# Patient Record
Sex: Male | Born: 1975
Health system: Southern US, Community
[De-identification: ages and names within clinical notes are randomized; demographics above are authoritative.]

## PROBLEM LIST (undated history)

## (undated) ENCOUNTER — Emergency Department (HOSPITAL_COMMUNITY): Payer: Medicare HMO

## (undated) DIAGNOSIS — L309 Dermatitis, unspecified: Secondary | ICD-10-CM

## (undated) DIAGNOSIS — J449 Chronic obstructive pulmonary disease, unspecified: Secondary | ICD-10-CM

## (undated) DIAGNOSIS — R519 Headache, unspecified: Secondary | ICD-10-CM

## (undated) DIAGNOSIS — R51 Headache: Secondary | ICD-10-CM

## (undated) DIAGNOSIS — I2510 Atherosclerotic heart disease of native coronary artery without angina pectoris: Secondary | ICD-10-CM

## (undated) DIAGNOSIS — G510 Bell's palsy: Secondary | ICD-10-CM

## (undated) DIAGNOSIS — Z86011 Personal history of benign neoplasm of the brain: Secondary | ICD-10-CM

## (undated) DIAGNOSIS — Z8669 Personal history of other diseases of the nervous system and sense organs: Secondary | ICD-10-CM

## (undated) DIAGNOSIS — K603 Anal fistula, unspecified: Secondary | ICD-10-CM

## (undated) DIAGNOSIS — B20 Human immunodeficiency virus [HIV] disease: Secondary | ICD-10-CM

## (undated) DIAGNOSIS — D329 Benign neoplasm of meninges, unspecified: Secondary | ICD-10-CM

## (undated) DIAGNOSIS — J4489 Other specified chronic obstructive pulmonary disease: Secondary | ICD-10-CM

## (undated) DIAGNOSIS — M069 Rheumatoid arthritis, unspecified: Secondary | ICD-10-CM

## (undated) DIAGNOSIS — G8929 Other chronic pain: Secondary | ICD-10-CM

## (undated) DIAGNOSIS — M797 Fibromyalgia: Secondary | ICD-10-CM

## (undated) HISTORY — DX: Other chronic pain: G89.29

## (undated) HISTORY — DX: Anal fistula: K60.3

## (undated) HISTORY — PX: CARPAL TUNNEL RELEASE: SHX101

## (undated) HISTORY — DX: Chronic obstructive pulmonary disease, unspecified: J44.9

## (undated) HISTORY — DX: Anal fistula, unspecified: K60.30

## (undated) HISTORY — DX: Headache: R51

## (undated) HISTORY — DX: Headache, unspecified: R51.9

## (undated) HISTORY — DX: Bell's palsy: G51.0

## (undated) HISTORY — DX: Atherosclerotic heart disease of native coronary artery without angina pectoris: I25.10

## (undated) HISTORY — DX: Benign neoplasm of meninges, unspecified: D32.9

## (undated) HISTORY — PX: CHOLECYSTECTOMY: SHX55

---
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
## 2002-11-13 HISTORY — PX: ANAL FISTULOTOMY: SHX6423

## 2002-12-27 DIAGNOSIS — B20 Human immunodeficiency virus [HIV] disease: Secondary | ICD-10-CM

## 2002-12-27 DIAGNOSIS — Z21 Asymptomatic human immunodeficiency virus [HIV] infection status: Secondary | ICD-10-CM

## 2002-12-27 HISTORY — DX: Human immunodeficiency virus (HIV) disease: B20

## 2002-12-27 HISTORY — DX: Asymptomatic human immunodeficiency virus (hiv) infection status: Z21

## 2007-11-14 HISTORY — PX: CHOLECYSTECTOMY, LAPAROSCOPIC: SHX56

## 2012-11-13 HISTORY — PX: CARPAL TUNNEL WITH CUBITAL TUNNEL: SHX5608

## 2013-11-13 HISTORY — PX: COLONOSCOPY: SHX174

## 2015-01-14 DIAGNOSIS — Z8619 Personal history of other infectious and parasitic diseases: Secondary | ICD-10-CM | POA: Insufficient documentation

## 2015-05-28 HISTORY — PX: OTHER SURGICAL HISTORY: SHX169

## 2017-08-11 ENCOUNTER — Ambulatory Visit (HOSPITAL_COMMUNITY)
Admission: EM | Admit: 2017-08-11 | Discharge: 2017-08-11 | Disposition: A | Discharge status: Home / Self Care | Payer: Medicare (Managed Care) | Attending: Family Medicine | Admitting: Family Medicine

## 2017-08-11 ENCOUNTER — Encounter (HOSPITAL_COMMUNITY): Payer: Self-pay

## 2017-08-11 ENCOUNTER — Telehealth (HOSPITAL_COMMUNITY): Payer: Self-pay | Admitting: *Deleted

## 2017-08-11 DIAGNOSIS — B20 Human immunodeficiency virus [HIV] disease: Secondary | ICD-10-CM

## 2017-08-11 HISTORY — DX: Human immunodeficiency virus (HIV) disease: B20

## 2017-08-11 MED ORDER — EFAVIRENZ-EMTRICITAB-TENOFOVIR 600-200-300 MG PO TABS: 1.0000 | ORAL_TABLET | Freq: Every day | ORAL | 0 refills | Status: DC

## 2017-08-11 NOTE — ED Triage Notes (Signed)
Patient presents to [facility] for medication refill

## 2017-08-11 NOTE — ED Provider Notes (Signed)
[place]    CSN: [id number] Arrival date & time: 08/11/17  1603     History   Chief Complaint Chief Complaint  Patient presents with  . Medication Refill    HPI Jonathan Rangel is a 41 y.o. male.   The history is provided by the patient. No language interpreter was used.  HIV Positive/AIDS  This is a chronic problem. Episode onset: 14 years ago. The problem occurs constantly. The problem has not changed since onset.Associated symptoms comments: Weight loss. Nothing aggravates the symptoms. Nothing relieves the symptoms.  He recently moved to [place] from [place]. Yet to establish care with infectious disease specialist. He was on  Atripla but was out of meds since May of 2018/4 months ago. He had been on this medication since 2007. Denies any side effects to medication. He will need refill of meds to keep him till he sees his doctor.  He came with lab results from [facility] ED done 07/30/17.  CD4 Count: 657.05 previous he was in the 1500 CD 4 count. He presented a letter from [place] which confirmed that he participated in HIV assistance program.   Past Medical History:  Diagnosis Date  . HIV (human immunodeficiency virus infection) ([place]) 12/27/2002    There are no active problems to display for this patient.   History reviewed. No pertinent surgical history.     Home Medications    Prior to Admission medications   Medication Sig Start Date End Date Taking? Authorizing Provider  albuterol (PROVENTIL HFA;VENTOLIN HFA) 108 (90 Base) MCG/ACT inhaler Inhale into the lungs every 6 (six) hours as needed for wheezing or shortness of breath.   Yes [provider]  amitriptyline (ELAVIL) 75 MG tablet Take 75 mg by mouth at bedtime.   Yes [provider]  HYDROcodone-acetaminophen (NORCO) 10-325 MG tablet Take 1 tablet by mouth every 6 (six) hours as needed.   Yes [provider]  methocarbamol (ROBAXIN) 750 MG tablet  Take 750 mg by mouth 4 (four) times daily.   Yes [provider]  efavirenz-emtricitabine-tenofovir (ATRIPLA) 600-200-300 MG tablet Take 1 tablet by mouth at bedtime.    [provider]    Family History History reviewed. No pertinent family history.  Social History Social History  Substance Use Topics  . Smoking status: Former Smoker    Types: Cigarettes  . Smokeless tobacco: Never Used  . Alcohol use No     Allergies   Patient has no known allergies.   Review of Systems Review of Systems  Constitutional: Positive for unexpected weight change. Negative for fever.  Respiratory: Negative.   Cardiovascular: Negative.   Gastrointestinal: Negative.   Genitourinary: Negative.   Neurological: Negative.   All other systems reviewed and are negative.    Physical Exam Triage Vital Signs ED Triage Vitals [08/11/17 1645]  Enc Vitals Group     BP 134/81     Pulse Rate 67     Resp 15     Temp 98.6 F (37 C)     Temp Source Oral     SpO2 100 %     Weight      Height      Head Circumference      Peak Flow      Pain Score      Pain Loc      Pain Edu?      Excl. in [place]?    No data found.   Updated Vital Signs BP  134/81 (BP Location: Left Arm)   Pulse 67   Temp 98.6 F (37 C) (Oral)   Resp 15   SpO2 100%   Visual Acuity Right Eye Distance:   Left Eye Distance:   Bilateral Distance:    Right Eye Near:   Left Eye Near:    Bilateral Near:     Physical Exam  Constitutional: He is oriented to person, place, and time. He appears well-developed. No distress.  Cardiovascular: Normal rate, regular rhythm and normal heart sounds.   No murmur heard. Pulmonary/Chest: Effort normal and breath sounds normal. No respiratory distress. He has no wheezes.  Abdominal: Soft. Bowel sounds are normal. He exhibits no distension and no mass. There is no tenderness.  Musculoskeletal: Normal range of motion. He exhibits no edema.  Neurological: He is alert and  oriented to person, place, and time. No cranial nerve deficit.  Skin: Skin is warm. No rash noted.  Nursing note and vitals reviewed.    UC Treatments / Results  Labs (all labs ordered are listed, but only abnormal results are displayed) Labs Reviewed - No data to display  EKG  EKG Interpretation None       Radiology No results found.  Procedures Procedures (including critical care time)  Medications Ordered in UC Medications - No data to display   Initial Impression / Assessment and Plan / UC Course  I have reviewed the triage vital signs and the nursing notes.  Pertinent labs & imaging results that were available during my care of the patient were reviewed by me and considered in my medical decision making (see chart for details).  Clinical Course as of Aug 11 1757  Sat Aug 11, 2017  1755 HIV medication refill. Labs and letter from [place] was reviewed by me confirming diagnosis. I recommended establishing care with ID as soon as possible. I refilled his Atripla for 1 month pending ID assessment. F/U as needed.  [KE]    Clinical Course User Index [KE] Kinnie Feil, MD      Final Clinical Impressions(s) / UC Diagnoses   Final diagnoses:  None   HIV disease ([place])   New Prescriptions New Prescriptions   No medications on file     Controlled Substance Prescriptions [place] Controlled Substance Registry consulted? Not Applicable   Kinnie Feil, MD 08/11/17 1759

## 2017-08-11 NOTE — Telephone Encounter (Signed)
Pt states pharmacy Rx was sent to is now closed.  Requesting Rx to go to [facility] [address].

## 2017-08-11 NOTE — Discharge Instructions (Signed)
Nice meeting you. I refilled your HIV medication pending the time you establish care with ID. Please call Dr. Henreitta Leber office as soon as possible to establish care. All the best.

## 2017-10-31 DIAGNOSIS — F333 Major depressive disorder, recurrent, severe with psychotic symptoms: Secondary | ICD-10-CM | POA: Diagnosis not present

## 2017-11-01 DIAGNOSIS — Z590 Homelessness: Secondary | ICD-10-CM | POA: Diagnosis not present

## 2017-11-01 DIAGNOSIS — Z21 Asymptomatic human immunodeficiency virus [HIV] infection status: Secondary | ICD-10-CM | POA: Diagnosis present

## 2017-11-01 DIAGNOSIS — M797 Fibromyalgia: Secondary | ICD-10-CM | POA: Diagnosis present

## 2017-11-01 DIAGNOSIS — F17210 Nicotine dependence, cigarettes, uncomplicated: Secondary | ICD-10-CM | POA: Diagnosis present

## 2017-11-01 DIAGNOSIS — Z62810 Personal history of physical and sexual abuse in childhood: Secondary | ICD-10-CM | POA: Diagnosis present

## 2017-11-01 DIAGNOSIS — R45851 Suicidal ideations: Secondary | ICD-10-CM | POA: Diagnosis present

## 2017-11-01 DIAGNOSIS — F3130 Bipolar disorder, current episode depressed, mild or moderate severity, unspecified: Secondary | ICD-10-CM | POA: Diagnosis not present

## 2017-11-01 DIAGNOSIS — Z915 Personal history of self-harm: Secondary | ICD-10-CM | POA: Diagnosis not present

## 2017-11-01 DIAGNOSIS — M069 Rheumatoid arthritis, unspecified: Secondary | ICD-10-CM | POA: Diagnosis present

## 2017-11-01 DIAGNOSIS — G629 Polyneuropathy, unspecified: Secondary | ICD-10-CM | POA: Diagnosis present

## 2017-11-20 ENCOUNTER — Encounter: Payer: Self-pay | Admitting: Infectious Diseases

## 2017-11-20 ENCOUNTER — Ambulatory Visit: Payer: Self-pay

## 2017-11-20 ENCOUNTER — Ambulatory Visit (INDEPENDENT_AMBULATORY_CARE_PROVIDER_SITE_OTHER): Payer: Medicare Other | Admitting: Infectious Diseases

## 2017-11-20 VITALS — BP 103/71 | HR 106 | Temp 98.4°F | Wt 143.0 lb

## 2017-11-20 DIAGNOSIS — R945 Abnormal results of liver function studies: Secondary | ICD-10-CM

## 2017-11-20 DIAGNOSIS — Z113 Encounter for screening for infections with a predominantly sexual mode of transmission: Secondary | ICD-10-CM

## 2017-11-20 DIAGNOSIS — Z8619 Personal history of other infectious and parasitic diseases: Secondary | ICD-10-CM | POA: Diagnosis not present

## 2017-11-20 DIAGNOSIS — M069 Rheumatoid arthritis, unspecified: Secondary | ICD-10-CM | POA: Insufficient documentation

## 2017-11-20 DIAGNOSIS — G894 Chronic pain syndrome: Secondary | ICD-10-CM | POA: Diagnosis not present

## 2017-11-20 DIAGNOSIS — K7689 Other specified diseases of liver: Secondary | ICD-10-CM

## 2017-11-20 DIAGNOSIS — M797 Fibromyalgia: Secondary | ICD-10-CM

## 2017-11-20 DIAGNOSIS — B20 Human immunodeficiency virus [HIV] disease: Secondary | ICD-10-CM | POA: Diagnosis not present

## 2017-11-20 DIAGNOSIS — Z21 Asymptomatic human immunodeficiency virus [HIV] infection status: Secondary | ICD-10-CM

## 2017-11-20 MED ORDER — BICTEGRAVIR-EMTRICITAB-TENOFOV 50-200-25 MG PO TABS: 1.0000 | ORAL_TABLET | Freq: Every day | ORAL | 5 refills | Status: DC

## 2017-11-20 NOTE — Progress Notes (Signed)
Jonathan Rangel  03/01/1976 [id number] Patient, No Pcp Per   Reason for Visit: Routine HIV Care - Initial Visit to Establish   Patient Active Problem List   Diagnosis Date Noted  . Abnormal liver function 11/23/2017  . Chronic pain   . HIV (human immunodeficiency virus infection) ([place]) 11/20/2017  . Rheumatoid arthritis ([place]) 11/20/2017  . Fibromyalgia 11/20/2017  . History of syphilis 01/14/2015   Outpatient Medications Prior to Visit  Medication Sig Dispense Refill  . albuterol (PROVENTIL HFA;VENTOLIN HFA) 108 (90 Base) MCG/ACT inhaler Inhale into the lungs every 6 (six) hours as needed for wheezing or shortness of breath.    Marland Kitchen amitriptyline (ELAVIL) 75 MG tablet Take 75 mg by mouth at bedtime.    Marland Kitchen efavirenz-emtricitabine-tenofovir (ATRIPLA) 600-200-300 MG tablet Take 1 tablet by mouth at bedtime. 30 tablet 0  . HYDROcodone-acetaminophen (NORCO) 10-325 MG tablet Take 1 tablet by mouth every 6 (six) hours as needed.    . methocarbamol (ROBAXIN) 750 MG tablet Take 750 mg by mouth 4 (four) times daily.     No facility-administered medications prior to visit.     HPI:  Jonathan Rangel is here to establish care for his HIV infection. He is working with [facility] and was referred to [facility]. He has previously been on Atripla until he moved here from [place]. He has been positive for a long time and well controlled since 2012. He is upset today because he is losing his housing and is angry at the limited resources available to him here in [place] compared to [place]. He does not have a plan for housing at this time but "will work something out." He does have family around however he does not want to discuss further. Reports no complaints today suggestive of associated opportunistic infection or advancing HIV disease such as fevers, night sweats, weight loss, anorexia, cough, SOB, nausea, vomiting, diarrhea, headache, sensory changes, lymphadenopathy or oral thrush.  He also has chronic pain/fibromyalgia and  rheumatoid arthritis and currently not on medications. He is looking into pain clinic for management.   Review of Systems: Review of Systems  Constitutional: Negative for chills, fever, malaise/fatigue and weight loss.  HENT: Negative for sore throat.        No dental problems  Respiratory: Negative for cough and sputum production.   Cardiovascular: Negative for chest pain and leg swelling.  Gastrointestinal: Negative for abdominal pain, diarrhea and vomiting.  Genitourinary: Negative for dysuria and flank pain.  Musculoskeletal: Positive for back pain, joint pain and myalgias. Negative for neck pain.  Skin: Negative for rash.  Neurological: Negative for dizziness, tingling and headaches.  Psychiatric/Behavioral: Negative for depression and substance abuse. The patient is not nervous/anxious and does not have insomnia.     Past Medical History:  Diagnosis Date  . Anal fistula   . Bell's palsy   . 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 CAD (coronary artery disease)   . Chronic pain   . COPD (chronic obstructive pulmonary disease) ([place])   . Headache   . HIV (human immunodeficiency virus infection) ([place]) 12/27/2002    No Known Allergies  Social History   Tobacco Use  . Smoking status: Current Every Day Smoker    Types: Cigarettes  . Smokeless tobacco: Never Used  Substance Use Topics  . Alcohol use: No  . Drug use: No    Family History  Problem Relation Age of Onset  . Breast cancer Maternal Grandmother  Social History   Substance and Sexual Activity  Sexual Activity No    Physical Exam and Objective Findings:  Vitals:   11/20/17 1452  BP: 103/71  Pulse: (!) 106  Temp: 98.4 F (36.9 C)  TempSrc: Oral  Weight: 143 lb (64.9 kg)   There is no height or weight on file to calculate BMI.  Physical Exam  Constitutional: He is oriented to person, place, and time and well-developed, well-nourished, and in no distress.  HENT:    Mouth/Throat: No oral lesions. Normal dentition. No dental caries.  Eyes: No scleral icterus.  Cardiovascular: Normal rate, regular rhythm and normal heart sounds.  Pulmonary/Chest: Effort normal and breath sounds normal.  Abdominal: Soft. He exhibits no distension. There is no tenderness.  Lymphadenopathy:    He has no cervical adenopathy.  Neurological: He is alert and oriented to person, place, and time.  Skin: Skin is warm and dry. No rash noted.  Psychiatric: Mood and affect normal.  Vitals reviewed.   Lab Results Lab Results  Component Value Date   WBC 8.6 11/20/2017   HGB 16.5 11/20/2017   HCT 47.8 11/20/2017   MCV 88.2 11/20/2017   PLT 381 11/20/2017    Lab Results  Component Value Date   CREATININE 1.09 11/20/2017   BUN 20 11/20/2017   NA 143 11/20/2017   K 4.1 11/20/2017   CL 103 11/20/2017   CO2 23 11/20/2017    Lab Results  Component Value Date   ALT 41 11/20/2017   AST 33 11/20/2017   BILITOT 0.4 11/20/2017    Lab Results  Component Value Date   CHOL 230 (H) 11/20/2017   HDL 59 11/20/2017   TRIG 154 (H) 11/20/2017   CHOLHDL 3.9 11/20/2017   CD4 T Cell Abs (/uL)  Date Value  11/20/2017 1,120   No results found for: HAV Lab Results  Component Value Date   HEPBSAG NON-REACTIVE 11/20/2017   HEPBSAB BORDERLINE (A) 11/20/2017   No results found for: HCVAB No results found for: CHLAMYDIAWP, N No results found for: GCPROBEAPT No results found for: QUANTGOLD No results found for: RPR    Problem List Items Addressed This Visit      Musculoskeletal and Integument   Rheumatoid arthritis ([place])     Other   Abnormal liver function    Recheck LFTs today as well as hepatitis serologies.       Chronic pain    He will establish with a pain clinic here locally.       Fibromyalgia   History of syphilis    Check RPR titer today. I will request more information from previous provider about older titers to better interpret. No active symptoms. Safe  sex counseling provided.       HIV (human immunodeficiency virus infection) ([place]) (Chronic)    We reviewed newer medication options today with [place]. We decided to change to [facility] today for benefit of TAF over TDF. His clinical records indicate he has been undetectable since 2012. Check baseline labs today. Will have him return in 4 - 6 weeks with blood work at that visit to ensure [facility] is working for him. (currently working on housing and transportation).       Relevant Medications   bictegravir-emtricitabine-tenofovir AF (BIKTARVY) 50-200-25 MG TABS tablet   Other Relevant Orders   HIV RNA, RTPCR W/R GT (RTI, PI,INT)   T-helper cell (CD4)- ([facility] clinic only) (Completed)   COMPLETE METABOLIC PANEL WITH GFR (Completed)   CBC with Differential/Platelet (  Completed)   Lipid panel (Completed)   Hepatitis C genotype   HLA B*5701   Hemoglobin A1c (Completed)   Hepatitis A antibody, total (Completed)   Hepatitis B surface antibody (Completed)   Hepatitis B surface antigen (Completed)   Hepatitis C antibody (Completed)   Hepatitis B Core Antibody, total (Completed)   QuantiFERON-TB Gold Plus (Completed)    Other Visit Diagnoses    Routine screening for STI (sexually transmitted infection)    -  Primary   Relevant Orders   RPR (Completed)     Janene Madeira, MSN, NP-C [facility] Pager: [phone]  11/23/2017  3:58 PM

## 2017-11-20 NOTE — Patient Instructions (Signed)
Very nice to meet you today.   Biktarvy is the pill for your HIV infection - this will need to be taken once a day around the same time.  - Common side effects for a short time frame usually include headaches, nausea and diarrhea - OK to take over the counter tylenol for headaches and imodium for diarrhea - Try taking with food if you are nauseated    We will check your blood work today and again in 4-6 weeks when you return to make sure the medication is working well for you.

## 2017-11-21 ENCOUNTER — Encounter: Payer: Self-pay | Admitting: Infectious Diseases

## 2017-11-21 LAB — LIPID PANEL
Cholesterol: 230 mg/dL — ABNORMAL HIGH (ref <200)
HDL: 59 mg/dL (ref >40)
LDL Cholesterol (Calc): 142 mg/dL (calc) — ABNORMAL HIGH
Non-HDL Cholesterol (Calc): 171 mg/dL (calc) — ABNORMAL HIGH (ref <130)
Total CHOL/HDL Ratio: 3.9 (calc) (ref <5.0)
Triglycerides: 154 mg/dL — ABNORMAL HIGH (ref <150)

## 2017-11-21 NOTE — Progress Notes (Signed)
Will discuss adding statin and baby aspirin daily for CV risk reduction as he is actively smoking at his upcoming visit in 1 month.

## 2017-11-22 LAB — QUANTIFERON-TB GOLD PLUS
Mitogen-NIL: >10 IU/mL
NIL: 0.07 IU/mL
QuantiFERON-TB Gold Plus: NEGATIVE
TB1-NIL: 0.06 IU/mL
TB2-NIL: 0.03 IU/mL

## 2017-11-22 LAB — T-HELPER CELL (CD4) - (RCID CLINIC ONLY)
CD4 % Helper T Cell: 26 % — ABNORMAL LOW (ref 33–55)
CD4 T Cell Abs: 1120 /uL (ref 400–2700)

## 2017-11-23 ENCOUNTER — Encounter: Payer: Self-pay | Admitting: Infectious Diseases

## 2017-11-23 DIAGNOSIS — R945 Abnormal results of liver function studies: Secondary | ICD-10-CM | POA: Insufficient documentation

## 2017-11-23 DIAGNOSIS — G8929 Other chronic pain: Secondary | ICD-10-CM | POA: Insufficient documentation

## 2017-11-23 LAB — HEPATITIS C GENOTYPE: HCV Genotype: NOT DETECTED

## 2017-11-23 NOTE — Assessment & Plan Note (Signed)
He will establish with a pain clinic here locally.

## 2017-11-23 NOTE — Assessment & Plan Note (Signed)
We reviewed newer medication options today with Jonathan Rangel. We decided to change to Jonathan Rangel today for benefit of TAF over TDF. His clinical records indicate he has been undetectable since 2012. Check baseline labs today. Will have him return in 4 - 6 weeks with blood work at that visit to ensure Jonathan Rangel is working for him. (currently working on housing and transportation).

## 2017-11-23 NOTE — Assessment & Plan Note (Signed)
Check RPR titer today. I will request more information from previous provider about older titers to better interpret. No active symptoms. Safe sex counseling provided.

## 2017-11-23 NOTE — Assessment & Plan Note (Signed)
Recheck LFTs today as well as hepatitis serologies.

## 2017-11-24 LAB — CBC WITH DIFFERENTIAL/PLATELET
Basophils Absolute: 77 cells/uL (ref 0–200)
Basophils Relative: 0.9 %
Eosinophils Absolute: 163 cells/uL (ref 15–500)
Eosinophils Relative: 1.9 %
HCT: 47.8 % (ref 38.5–50.0)
Hemoglobin: 16.5 g/dL (ref 13.2–17.1)
Lymphs Abs: 3844 cells/uL (ref 850–3900)
MCH: 30.4 pg (ref 27.0–33.0)
MCHC: 34.5 g/dL (ref 32.0–36.0)
MCV: 88.2 fL (ref 80.0–100.0)
MPV: 9.6 fL (ref 7.5–12.5)
Monocytes Relative: 11 %
Neutro Abs: 3569 cells/uL (ref 1500–7800)
Neutrophils Relative %: 41.5 %
Platelets: 381 10*3/uL (ref 140–400)
RBC: 5.42 10*6/uL (ref 4.20–5.80)
RDW: 14.4 % (ref 11.0–15.0)
Total Lymphocyte: 44.7 %
WBC mixed population: 946 cells/uL (ref 200–950)
WBC: 8.6 10*3/uL (ref 3.8–10.8)

## 2017-11-24 LAB — HLA B*5701: HLA-B*5701 w/rflx HLA-B High: NEGATIVE

## 2017-11-24 LAB — COMPLETE METABOLIC PANEL WITH GFR
AG Ratio: 1.7 (calc) (ref 1.0–2.5)
ALT: 41 U/L (ref 9–46)
AST: 33 U/L (ref 10–40)
Albumin: 5 g/dL (ref 3.6–5.1)
Alkaline phosphatase (APISO): 121 U/L — ABNORMAL HIGH (ref 40–115)
BUN: 20 mg/dL (ref 7–25)
CO2: 23 mmol/L (ref 20–32)
Calcium: 10.3 mg/dL (ref 8.6–10.3)
Chloride: 103 mmol/L (ref 98–110)
Creat: 1.09 mg/dL (ref 0.60–1.35)
GFR, Est African American: 97 mL/min/{1.73_m2} (ref >=60)
GFR, Est Non African American: 83 mL/min/{1.73_m2} (ref >=60)
Globulin: 3 g/dL (calc) (ref 1.9–3.7)
Glucose, Bld: 95 mg/dL (ref 65–99)
Potassium: 4.1 mmol/L (ref 3.5–5.3)
Sodium: 143 mmol/L (ref 135–146)
Total Bilirubin: 0.4 mg/dL (ref 0.2–1.2)
Total Protein: 8 g/dL (ref 6.1–8.1)

## 2017-11-24 LAB — HEPATITIS B SURFACE ANTIBODY,QUALITATIVE: Hep B S Ab: BORDERLINE — AB

## 2017-11-24 LAB — HEMOGLOBIN A1C
Hgb A1c MFr Bld: 5.6 % of total Hgb (ref <5.7)
Mean Plasma Glucose: 114 (calc)
eAG (mmol/L): 6.3 (calc)

## 2017-11-24 LAB — FLUORESCENT TREPONEMAL AB(FTA)-IGG-BLD: Fluorescent Treponemal ABS: REACTIVE — AB

## 2017-11-24 LAB — HEPATITIS C ANTIBODY
Hepatitis C Ab: NONREACTIVE
SIGNAL TO CUT-OFF: 0.04 (ref <1.00)

## 2017-11-24 LAB — HEPATITIS B SURFACE ANTIGEN: Hepatitis B Surface Ag: NONREACTIVE

## 2017-11-24 LAB — HEPATITIS B CORE ANTIBODY, TOTAL: Hep B Core Total Ab: NONREACTIVE

## 2017-11-24 LAB — RPR TITER: RPR Titer: 1:4 {titer} — ABNORMAL HIGH

## 2017-11-24 LAB — HEPATITIS A ANTIBODY, TOTAL: Hepatitis A AB,Total: REACTIVE — AB

## 2017-11-24 LAB — RPR: RPR Ser Ql: REACTIVE — AB

## 2017-11-24 NOTE — Progress Notes (Signed)
RPR titer 1:4 now - Last RPR Titer per previous records shows titer 1:16. No evidence of reinfection and expected 4-fold drop post treatment. Continue to monitor as he has required treatment at least 2 times in the past.

## 2017-11-26 LAB — HIV RNA, RTPCR W/R GT (RTI, PI,INT)
HIV 1 RNA Quant: 30 copies/mL — ABNORMAL HIGH
HIV-1 RNA Quant, Log: 1.48 Log copies/mL — ABNORMAL HIGH

## 2017-12-11 ENCOUNTER — Encounter: Payer: Self-pay | Admitting: *Deleted

## 2017-12-24 DIAGNOSIS — Z21 Asymptomatic human immunodeficiency virus [HIV] infection status: Secondary | ICD-10-CM | POA: Diagnosis not present

## 2017-12-24 DIAGNOSIS — F17210 Nicotine dependence, cigarettes, uncomplicated: Secondary | ICD-10-CM | POA: Diagnosis not present

## 2017-12-24 DIAGNOSIS — K0889 Other specified disorders of teeth and supporting structures: Secondary | ICD-10-CM | POA: Diagnosis not present

## 2017-12-24 DIAGNOSIS — Z79899 Other long term (current) drug therapy: Secondary | ICD-10-CM | POA: Diagnosis not present

## 2018-01-24 ENCOUNTER — Encounter (HOSPITAL_COMMUNITY): Payer: Self-pay | Admitting: Emergency Medicine

## 2018-01-24 ENCOUNTER — Other Ambulatory Visit: Payer: Self-pay

## 2018-01-24 ENCOUNTER — Emergency Department (HOSPITAL_COMMUNITY)
Admission: EM | Admit: 2018-01-24 | Discharge: 2018-01-24 | Disposition: A | Discharge status: Home / Self Care | Payer: Medicare Other | Attending: Emergency Medicine | Admitting: Emergency Medicine

## 2018-01-24 ENCOUNTER — Emergency Department (HOSPITAL_COMMUNITY): Payer: Medicare Other

## 2018-01-24 DIAGNOSIS — F22 Delusional disorders: Secondary | ICD-10-CM

## 2018-01-24 DIAGNOSIS — J449 Chronic obstructive pulmonary disease, unspecified: Secondary | ICD-10-CM | POA: Diagnosis not present

## 2018-01-24 DIAGNOSIS — R440 Auditory hallucinations: Secondary | ICD-10-CM

## 2018-01-24 DIAGNOSIS — Z79899 Other long term (current) drug therapy: Secondary | ICD-10-CM | POA: Insufficient documentation

## 2018-01-24 DIAGNOSIS — F1994 Other psychoactive substance use, unspecified with psychoactive substance-induced mood disorder: Secondary | ICD-10-CM | POA: Diagnosis not present

## 2018-01-24 DIAGNOSIS — B20 Human immunodeficiency virus [HIV] disease: Secondary | ICD-10-CM | POA: Insufficient documentation

## 2018-01-24 DIAGNOSIS — F17210 Nicotine dependence, cigarettes, uncomplicated: Secondary | ICD-10-CM | POA: Insufficient documentation

## 2018-01-24 DIAGNOSIS — I2510 Atherosclerotic heart disease of native coronary artery without angina pectoris: Secondary | ICD-10-CM | POA: Insufficient documentation

## 2018-01-24 DIAGNOSIS — M069 Rheumatoid arthritis, unspecified: Secondary | ICD-10-CM | POA: Diagnosis not present

## 2018-01-24 LAB — RAPID URINE DRUG SCREEN, HOSP PERFORMED
Amphetamines: POSITIVE — AB
Barbiturates: NOT DETECTED
Benzodiazepines: NOT DETECTED
Cocaine: NOT DETECTED
Opiates: NOT DETECTED
Tetrahydrocannabinol: NOT DETECTED

## 2018-01-24 LAB — COMPREHENSIVE METABOLIC PANEL
ALT: 18 U/L (ref 17–63)
AST: 18 U/L (ref 15–41)
Albumin: 3.6 g/dL (ref 3.5–5.0)
Alkaline Phosphatase: 74 U/L (ref 38–126)
Anion gap: 9 (ref 5–15)
BUN: 12 mg/dL (ref 6–20)
CO2: 27 mmol/L (ref 22–32)
Calcium: 8.7 mg/dL — ABNORMAL LOW (ref 8.9–10.3)
Chloride: 102 mmol/L (ref 101–111)
Creatinine, Ser: 1.04 mg/dL (ref 0.61–1.24)
GFR calc Af Amer: >60 mL/min (ref >60)
GFR calc non Af Amer: >60 mL/min (ref >60)
Glucose, Bld: 101 mg/dL — ABNORMAL HIGH (ref 65–99)
Potassium: 3.2 mmol/L — ABNORMAL LOW (ref 3.5–5.1)
Sodium: 138 mmol/L (ref 135–145)
Total Bilirubin: 0.4 mg/dL (ref 0.3–1.2)
Total Protein: 6.4 g/dL — ABNORMAL LOW (ref 6.5–8.1)

## 2018-01-24 LAB — CBC WITH DIFFERENTIAL/PLATELET
Basophils Absolute: 0 10*3/uL (ref 0.0–0.1)
Basophils Relative: 0 %
Eosinophils Absolute: 0.1 10*3/uL (ref 0.0–0.7)
Eosinophils Relative: 2 %
HCT: 39.7 % (ref 39.0–52.0)
Hemoglobin: 12.8 g/dL — ABNORMAL LOW (ref 13.0–17.0)
Lymphocytes Relative: 30 %
Lymphs Abs: 1.9 10*3/uL (ref 0.7–4.0)
MCH: 30.8 pg (ref 26.0–34.0)
MCHC: 32.2 g/dL (ref 30.0–36.0)
MCV: 95.7 fL (ref 78.0–100.0)
Monocytes Absolute: 0.6 10*3/uL (ref 0.1–1.0)
Monocytes Relative: 9 %
Neutro Abs: 3.9 10*3/uL (ref 1.7–7.7)
Neutrophils Relative %: 59 %
Platelets: 258 10*3/uL (ref 150–400)
RBC: 4.15 MIL/uL — ABNORMAL LOW (ref 4.22–5.81)
RDW: 14 % (ref 11.5–15.5)
WBC: 6.5 10*3/uL (ref 4.0–10.5)

## 2018-01-24 LAB — ETHANOL: Alcohol, Ethyl (B): <10 mg/dL (ref <10)

## 2018-01-24 MED ORDER — ALBUTEROL SULFATE HFA 108 (90 BASE) MCG/ACT IN AERS
2.0000 | INHALATION_SPRAY | Freq: Four times a day (QID) | RESPIRATORY_TRACT | Status: DC | PRN
Start: 2018-01-24 — End: 2018-01-24

## 2018-01-24 MED ORDER — BICTEGRAVIR-EMTRICITAB-TENOFOV 50-200-25 MG PO TABS
1.0000 | ORAL_TABLET | Freq: Every day | ORAL | Status: DC
  Administered 2018-01-24: 1 via ORAL
  Filled 2018-01-24: qty 1

## 2018-01-24 MED ORDER — GABAPENTIN 400 MG PO CAPS
400.0000 mg | ORAL_CAPSULE | Freq: Four times a day (QID) | ORAL | Status: DC
  Administered 2018-01-24: 400 mg via ORAL
  Filled 2018-01-24: qty 1

## 2018-01-24 MED ORDER — IBUPROFEN 200 MG PO TABS: 600.0000 mg | ORAL_TABLET | Freq: Four times a day (QID) | ORAL | Status: DC | PRN

## 2018-01-24 MED ORDER — ACETAMINOPHEN 325 MG PO TABS: 650.0000 mg | ORAL_TABLET | ORAL | Status: DC | PRN

## 2018-01-24 MED ORDER — FLUOXETINE HCL 20 MG PO CAPS
40.0000 mg | ORAL_CAPSULE | Freq: Every day | ORAL | Status: DC
  Administered 2018-01-24: 40 mg via ORAL
  Filled 2018-01-24: qty 2

## 2018-01-24 MED ORDER — ALUM & MAG HYDROXIDE-SIMETH 200-200-20 MG/5ML PO SUSP: 30.0000 mL | Freq: Four times a day (QID) | ORAL | Status: DC | PRN

## 2018-01-24 MED ORDER — POTASSIUM CHLORIDE CRYS ER 20 MEQ PO TBCR
40.0000 meq | EXTENDED_RELEASE_TABLET | Freq: Once | ORAL | Status: AC
  Administered 2018-01-24: 40 meq via ORAL
  Filled 2018-01-24: qty 2

## 2018-01-24 NOTE — ED Notes (Signed)
Pt continues to refuse ordered CT.

## 2018-01-24 NOTE — ED Notes (Signed)
Pt d/c home per MD order. Discharge summary reviewed with pt. Pt verbalizes understanding.  Pt denies SI/HI/AVH. Pt signed e-signature. Personal property returned to pt.Bus pass provided per pt request. Pt ambulatory off unit with [facility],.

## 2018-01-24 NOTE — BH Assessment (Signed)
[facility] Assessment Progress Note  Per Buford Dresser, DO, this pt does not require psychiatric hospitalization at this time.  Pt is to be discharged from [facility] with recommendation to follow up with [facility].  This has been included in pt's discharge instructions.  This Probation officer also called Levada Dy, nurse case Freight forwarder, who reports that pt has infectious disease support in the community.  Pt's nurse, Caryl Pina, has been notified.  Jalene Mullet, [place] Triage Specialist [phone]

## 2018-01-24 NOTE — BH Assessment (Signed)
[facility] Assessment Progress Note   Patient has not slept in a long time and refuses to participate in assessment at this time.

## 2018-01-24 NOTE — Care Management Note (Signed)
Case Management Note  CM consulted for ID referral.  CM noted pt was just seen at [facility] in Jan.  Placed information on AVS for time of D/C.  Updated Tom.  No further CM needs noted at this time.

## 2018-01-24 NOTE — ED Provider Notes (Signed)
[facility] DEPT Provider Note   CSN: [id number] Arrival date & time: 01/24/18  0247     History   Chief Complaint Chief Complaint  Patient presents with  . Medical Clearance    HPI Jonathan Rangel is a 42 y.o. male.  HPI  42 yo m with PMHx as below here with hallucinations. Pt reports that he's here because he had a hard time sleeping at his house. He states that he lives with a roommate and that over the past several months, he's had worsening hallucinations. He reports that he hears the voices of people he knows in his head, constantly. They've been getting worse. Last night, he felt like his old roommate and current roommate were plotting against him. He began to hear voices telling him that he should hurt himself, and that they were going to kill him. He felt like this was actually his roommate talking, rather than voices in his head. He also thinks that someone has been watching him via cameras in his house and has been outside his window. He denies h/o psych issues. No other medical complaints. No headache. No focal numbness or weakness.   Past Medical History:  Diagnosis Date  . Anal fistula   . Bell's palsy   . Benign neoplasm of meninges ([place])    gamma knife 05/28/15  . CAD (coronary artery disease)   . Chronic pain   . COPD (chronic obstructive pulmonary disease) ([place])   . Headache   . HIV (human immunodeficiency virus infection) ([place]) 12/27/2002    Patient Active Problem List   Diagnosis Date Noted  . Abnormal liver function 11/23/2017  . Chronic pain   . HIV (human immunodeficiency virus infection) ([place]) 11/20/2017  . Rheumatoid arthritis ([address]) 11/20/2017  . Fibromyalgia 11/20/2017  . History of syphilis 01/14/2015    Past Surgical History:  Procedure Laterality Date  . CARPAL TUNNEL RELEASE    . CHOLECYSTECTOMY    . COLONOSCOPY  2015   patient reported normal   . Stereotactic destruction of lesion using gamma radiation   05/28/2015   right temporal lobe   . TONSILLECTOMY         Home Medications    Prior to Admission medications   Medication Sig Start Date End Date Taking? Authorizing Provider  albuterol (PROVENTIL HFA;VENTOLIN HFA) 108 (90 Base) MCG/ACT inhaler Inhale into the lungs every 6 (six) hours as needed for wheezing or shortness of breath.   Yes [provider]  clindamycin (CLEOCIN) 300 MG capsule Take 1 capsule by mouth every 6 (six) hours. 12/27/17  Yes [provider]  FLUoxetine (PROZAC) 40 MG capsule Take 40 mg by mouth daily.   Yes [provider]  gabapentin (NEURONTIN) 400 MG capsule Take 400 mg by mouth 4 (four) times daily.   Yes [provider]  ibuprofen (ADVIL,MOTRIN) 200 MG tablet Take 600 mg by mouth every 6 (six) hours as needed for moderate pain.   Yes [provider]  bictegravir-emtricitabine-tenofovir AF (BIKTARVY) 50-200-25 MG TABS tablet Take 1 tablet by mouth daily. Try to take at the same time each day with or without food. Patient not taking: Reported on 01/24/2018 11/20/17   [place] Callas, NP  efavirenz-emtricitabine-tenofovir (ATRIPLA) 600-200-300 MG tablet Take 1 tablet by mouth at bedtime. Patient not taking: Reported on 01/24/2018 08/11/17   Kinnie Feil, MD    Family History Family History  Problem Relation Age of Onset  . Breast cancer Maternal Grandmother   .  Cancer Maternal Aunt     Social History Social History   Tobacco Use  . Smoking status: Current Every Day Smoker    Types: Cigarettes  . Smokeless tobacco: Never Used  Substance Use Topics  . Alcohol use: No  . Drug use: No     Allergies   Patient has no known allergies.   Review of Systems Review of Systems  Constitutional: Negative for chills, fatigue and fever.  HENT: Negative for congestion and rhinorrhea.   Eyes: Negative for visual disturbance.  Respiratory: Negative for cough, shortness of breath and wheezing.   Cardiovascular:  Negative for chest pain and leg swelling.  Gastrointestinal: Negative for abdominal pain, diarrhea, nausea and vomiting.  Genitourinary: Negative for dysuria and flank pain.  Musculoskeletal: Negative for neck pain and neck stiffness.  Skin: Negative for rash and wound.  Allergic/Immunologic: Negative for immunocompromised state.  Neurological: Negative for syncope, weakness and headaches.  Psychiatric/Behavioral: Positive for hallucinations.  All other systems reviewed and are negative.    Physical Exam Updated Vital Signs BP 118/68 (BP Location: Left Arm)   Pulse 90   Temp 98.1 F (36.7 C) (Oral)   Resp 18   SpO2 99%   Physical Exam  Constitutional: He is oriented to person, place, and time. He appears well-developed and well-nourished. No distress.  HENT:  Head: Normocephalic and atraumatic.  Eyes: Conjunctivae are normal.  Neck: Neck supple.  Cardiovascular: Normal rate, regular rhythm and normal heart sounds. Exam reveals no friction rub.  No murmur heard. Pulmonary/Chest: Effort normal and breath sounds normal. No respiratory distress. He has no wheezes. He has no rales.  Abdominal: He exhibits no distension.  Musculoskeletal: He exhibits no edema.  Neurological: He is alert and oriented to person, place, and time. He has normal strength. No cranial nerve deficit or sensory deficit. He exhibits normal muscle tone. GCS eye subscore is 4. GCS verbal subscore is 5. GCS motor subscore is 6.  Skin: Skin is warm. Capillary refill takes less than 2 seconds.  Psychiatric: He has a normal mood and affect. Thought content is paranoid.  +AH  Nursing note and vitals reviewed.    ED Treatments / Results  Labs (all labs ordered are listed, but only abnormal results are displayed) Labs Reviewed  COMPREHENSIVE METABOLIC PANEL - Abnormal; Notable for the following components:      Result Value   Potassium 3.2 (*)    Glucose, Bld 101 (*)    Calcium 8.7 (*)    Total Protein 6.4  (*)    All other components within normal limits  CBC WITH DIFFERENTIAL/PLATELET - Abnormal; Notable for the following components:   RBC 4.15 (*)    Hemoglobin 12.8 (*)    All other components within normal limits  ETHANOL  RAPID URINE DRUG SCREEN, HOSP PERFORMED    EKG  EKG Interpretation  Date/Time:  Thursday January 24 2018 03:57:04 EDT Ventricular Rate:  82 PR Interval:  128 QRS Duration: 90 QT Interval:  376 QTC Calculation: 439 R Axis:   70 Text Interpretation:  Normal sinus rhythm Moderate voltage criteria for LVH, may be normal variant Nonspecific T wave abnormality Abnormal ECG No old tracing to compare Confirmed by Duffy Bruce [phone]) on 01/24/2018 5:04:10 AM       Radiology No results found.  Procedures Procedures (including critical care time)  Medications Ordered in ED Medications  acetaminophen (TYLENOL) tablet 650 mg (not administered)  alum & mag hydroxide-simeth (MAALOX/MYLANTA) 200-200-20 MG/5ML suspension 30 mL (not  administered)  albuterol (PROVENTIL HFA;VENTOLIN HFA) 108 (90 Base) MCG/ACT inhaler 2 puff (not administered)  FLUoxetine (PROZAC) capsule 40 mg (not administered)  gabapentin (NEURONTIN) capsule 400 mg (not administered)  ibuprofen (ADVIL,MOTRIN) tablet 600 mg (not administered)  bictegravir-emtricitabine-tenofovir AF (BIKTARVY) 50-200-25 MG per tablet 1 tablet (not administered)  potassium chloride SA (K-DUR,KLOR-CON) CR tablet 40 mEq (40 mEq Oral Given 01/24/18 0510)     Initial Impression / Assessment and Plan / ED Course  I have reviewed the triage vital signs and the nursing notes.  Pertinent labs & imaging results that were available during my care of the patient were reviewed by me and considered in my medical decision making (see chart for details).     42 yo M with PMHx as above including HIV, h/o meningioma here with auditory hallucinations, paranoia. Concern for acute psychotic disorder. He has no focal neuro deficits, but  given his h/o HIV and meningioma, will obtain CT though history, exam is more c/w primary psychotic disorder. He is otherwise medically stable. Mild hypokalemia has been replaced. His HIV meds have been re-ordered. He has no HA, neck stiffness, or signs of meningitis, encephalitis, or infectious process. Awaiting CT, TTS consult.  Patient care transferred to Dr. Alvino Chapel at the end of my shift. Patient presentation, ED course, and plan of care discussed with review of all pertinent labs and imaging. Please see his/her note for further details regarding further ED course and disposition.   Final Clinical Impressions(s) / ED Diagnoses   Final diagnoses:  Paranoia [facility])  Auditory hallucination    ED Discharge Orders    None       Duffy Bruce, MD 01/24/18 [phone]

## 2018-01-24 NOTE — ED Notes (Signed)
Patient appears paranoid and denies SI/HI/AVH. Plan of care discussed. Encouragement and support provided and safety maintain. Q 15 min safety checks in place and video monitoring.

## 2018-01-24 NOTE — BH Assessment (Signed)
Assessment Note  Jonathan Rangel is an 42 y.o. male. Pt denies SI/HI. Pt reports AH. Pt was poor historian. Pt states that his voices make him laugh. Pt denies command voices. Pt states he has been diagnosed with Schizophrenia. Pt denies current mental health treatment. Pt denies previous inpatient treatment. Pt denies current mental health medication. Pt denies SA.  Dr. Mariea Clonts and Margarita Grizzle, NP recommend D/C and follow-up with outpatient resources.   Diagnosis:  F20.9 Schizophrenia  Past Medical History:  Past Medical History:  Diagnosis Date  . Anal fistula   . Bell's palsy   . Benign neoplasm of meninges ([place])    gamma knife 05/28/15  . CAD (coronary artery disease)   . Chronic pain   . COPD (chronic obstructive pulmonary disease) ([place])   . Headache   . HIV (human immunodeficiency virus infection) ([place]) 12/27/2002    Past Surgical History:  Procedure Laterality Date  . CARPAL TUNNEL RELEASE    . CHOLECYSTECTOMY    . COLONOSCOPY  2015   patient reported normal   . Stereotactic destruction of lesion using gamma radiation  05/28/2015   right temporal lobe   . TONSILLECTOMY      Family History:  Family History  Problem Relation Age of Onset  . Breast cancer Maternal Grandmother   . Cancer Maternal Aunt     Social History:  reports that he has been smoking cigarettes.  he has never used smokeless tobacco. He reports that he does not drink alcohol or use drugs.  Additional Social History:  Alcohol / Drug Use Pain Medications: please see mar Prescriptions: please see mar Over the Counter: please see mar History of alcohol / drug use?: No history of alcohol / drug abuse Longest period of sobriety (when/how long): NA  CIWA: CIWA-Ar BP: 118/68 Pulse Rate: 90 COWS:    Allergies: No Known Allergies  Home Medications:  (Not in a hospital admission)  OB/GYN Status:  No LMP for male patient.  General Assessment Data Assessment unable to be completed: Yes Reason for not  completing assessment: Pt sleeping, refusing to participate Location of Assessment: [facility] ED TTS Assessment: In system Is this a Tele or Face-to-Face Assessment?: Face-to-Face Is this an Initial Assessment or a Re-assessment for this encounter?: Initial Assessment Marital status: Single Maiden name: NA Is patient pregnant?: No Pregnancy Status: No Living Arrangements: Non-relatives/Friends Can pt return to current living arrangement?: Yes Admission Status: Voluntary Is patient capable of signing voluntary admission?: Yes Referral Source: Self/Family/Friend Insurance type: Medicaid     Crisis Care Plan Living Arrangements: Non-relatives/Friends Legal Guardian: Other:(self) Name of Psychiatrist: NA Name of Therapist: NA  Education Status Is patient currently in school?: No Is the patient employed, unemployed or receiving disability?: Unemployed  Risk to self with the past 6 months Suicidal Ideation: No Has patient been a risk to self within the past 6 months prior to admission? : No Suicidal Intent: No Has patient had any suicidal intent within the past 6 months prior to admission? : No Is patient at risk for suicide?: No Suicidal Plan?: No Has patient had any suicidal plan within the past 6 months prior to admission? : No Access to Means: No What has been your use of drugs/alcohol within the last 12 months?: NA Previous Attempts/Gestures: No How many times?: 0 Other Self Harm Risks: NA Triggers for Past Attempts: None known Intentional Self Injurious Behavior: None Family Suicide History: No Recent stressful life event(s): Other (Comment)(homelessness) Persecutory voices/beliefs?: No Depression: No Depression Symptoms: (  pt denies) Substance abuse history and/or treatment for substance abuse?: No Suicide prevention information given to non-admitted patients: Not applicable  Risk to Others within the past 6 months Homicidal Ideation: No Does patient have any lifetime  risk of violence toward others beyond the six months prior to admission? : No Thoughts of Harm to Others: No Current Homicidal Intent: No Current Homicidal Plan: No Access to Homicidal Means: No Identified Victim: NA History of harm to others?: No Assessment of Violence: None Noted Violent Behavior Description: NA Does patient have access to weapons?: No Criminal Charges Pending?: No Does patient have a court date: No Is patient on probation?: No  Psychosis Hallucinations: None noted Delusions: None noted  Mental Status Report Appearance/Hygiene: Unremarkable Eye Contact: Fair Motor Activity: Freedom of movement Speech: Logical/coherent Level of Consciousness: Alert Mood: Euthymic Affect: Appropriate to circumstance Anxiety Level: Minimal Thought Processes: Coherent, Relevant Judgement: Unimpaired Orientation: Person, Place, Situation, Time Obsessive Compulsive Thoughts/Behaviors: None  Cognitive Functioning Concentration: Normal Memory: Recent Intact, Remote Intact Is patient IDD: No Is patient DD?: No Insight: Fair Impulse Control: Fair Appetite: Fair Have you had any weight changes? : No Change Sleep: Decreased Total Hours of Sleep: 5 Vegetative Symptoms: None  ADLScreening [facility] Assessment Services) Patient's cognitive ability adequate to safely complete daily activities?: Yes Patient able to express need for assistance with ADLs?: Yes Independently performs ADLs?: Yes (appropriate for developmental age)  Prior Inpatient Therapy Prior Inpatient Therapy: Yes Prior Therapy Dates: unknown Prior Therapy Facilty/Provider(s): unknown Reason for Treatment: unknown  Prior Outpatient Therapy Prior Outpatient Therapy: No Does patient have an ACCT team?: No Does patient have Intensive In-House Services?  : No Does patient have [facility] services? : No Does patient have [facility] services?: No  ADL Screening (condition at time of admission) Patient's cognitive ability  adequate to safely complete daily activities?: Yes Is the patient deaf or have difficulty hearing?: No Does the patient have difficulty seeing, even when wearing glasses/contacts?: No Does the patient have difficulty concentrating, remembering, or making decisions?: No Patient able to express need for assistance with ADLs?: Yes Does the patient have difficulty dressing or bathing?: No Independently performs ADLs?: Yes (appropriate for developmental age) Does the patient have difficulty walking or climbing stairs?: No       Abuse/Neglect Assessment (Assessment to be complete while patient is alone) Abuse/Neglect Assessment Can Be Completed: Yes Physical Abuse: Denies Verbal Abuse: Denies Sexual Abuse: Denies Exploitation of patient/patient's resources: Denies     Regulatory affairs officer (For Healthcare) Does Patient Have a Medical Advance Directive?: No Would patient like information on creating a medical advance directive?: No - Patient declined          Disposition:  Disposition Initial Assessment Completed for this Encounter: Yes  On Site Evaluation by:   Reviewed with Physician:    Cyndia Bent 01/24/2018 8:31 AM

## 2018-01-24 NOTE — Discharge Instructions (Signed)
For your mental health needs, you are advised to follow up with [facility].  New and returning patients are seen at their walk-in clinic.  Walk-in hours are Monday - Friday from 8:00 am - 3:00 pm.  Walk-in patients are seen on a first come, first served basis.  Try to arrive as early as possible for he best chance of being seen the same day: ° °     [facility] °     [address] °     [place], [place] [address] °     [phone] °
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
## 2018-01-24 NOTE — ED Notes (Signed)
Pt reports he hears staff talking and stating that his mother dosen't  Love him. Staff informed to avoid conversations near pt room.

## 2018-01-24 NOTE — [facility] Suicide Risk Assessment (Cosign Needed)
Suicide Risk Assessment  Discharge Assessment   [facility] Discharge Suicide Risk Assessment   Principal Problem: Substance induced mood disorder [facility]) Discharge Diagnoses:  Patient Active Problem List   Diagnosis Date Noted  . Substance induced mood disorder ([place]) [F19.94] 01/24/2018  . Abnormal liver function [K76.89] 11/23/2017  . Chronic pain [G89.29]   . HIV (human immunodeficiency virus infection) ([place]) [B20] 11/20/2017  . Rheumatoid arthritis ([place]) [M06.9] 11/20/2017  . Fibromyalgia [M79.7] 11/20/2017  . History of syphilis [Z86.19] 01/14/2015   Pt was seen and chart reviewed with treatment team and Dr Mariea Clonts. Pt denies suicidal/homicidal ideation, denies auditory/visual hallucinations and does not appear to be responding to internal stimuli. Pt stated he came to the hospital because his head was hurting but today it doesn't hurt. Pt is psychiatrically clear for discharge.   Total Time spent with patient: 30 minutes  Musculoskeletal: Strength & Muscle Tone: within normal limits Gait & Station: normal Patient leans: N/A  Psychiatric Specialty Exam:   Blood pressure 118/68, pulse 90, temperature 98.1 F (36.7 C), temperature source Oral, resp. rate 18, SpO2 99 %.There is no height or weight on file to calculate BMI.  General Appearance: Casual  Eye Contact::  Fair  Speech:  Clear and [id number]  Volume:  Decreased  Mood:  Euthymic  Affect:  Congruent  Thought Process:  Coherent, Goal Directed and Linear  Orientation:  Full (Time, Place, and Person)  Thought Content:  Logical  Suicidal Thoughts:  No  Homicidal Thoughts:  No  Memory:  Immediate;   Good Recent;   Good Remote;   Fair  Judgement:  Fair  Insight:  Fair  Psychomotor Activity:  Normal  Concentration:  Good  Recall:  Good  Fund of Knowledge:Good  Language: Good  Akathisia:  No  Handed:  Right  AIMS (if indicated):     Assets:  Agricultural consultant Housing Social Support   Sleep:     Cognition: WNL  ADL's:  Intact   Mental Status Per Nursing Assessment::   On Admission:   homeless and a headache  Demographic Factors:  Male, Low socioeconomic status and Unemployed  Loss Factors: Financial problems/change in socioeconomic status  Historical Factors: Impulsivity  Risk Reduction Factors:   Sense of responsibility to family and Living with another person, especially a relative  Continued Clinical Symptoms:  Depression:   Impulsivity Alcohol/Substance Abuse/Dependencies  Cognitive Features That Contribute To Risk:  Closed-mindedness    Suicide Risk:  Minimal: No identifiable suicidal ideation.  Patients presenting with no risk factors but with morbid ruminations; may be classified as minimal risk based on the severity of the depressive symptoms  Follow-up Information    [place] Callas, NP. Schedule an appointment as soon as possible for a visit.   Specialty:  Infectious Diseases Contact information: [place] [address] [phone]           Plan Of Care/Follow-up recommendations:  Activity:  as tolerated Diet:  Heart Healthy  Ethelene Hal, NP 01/24/2018, 11:27 AM

## 2018-01-24 NOTE — ED Notes (Signed)
Patient informed that urine sample is needed.

## 2018-01-24 NOTE — ED Notes (Signed)
Dr. Dene Gentry informed that patient refuse to go to CT at this time. Patient reports he will go later he just got to sleep.

## 2018-02-20 ENCOUNTER — Other Ambulatory Visit: Payer: Self-pay | Admitting: Family Medicine

## 2018-02-21 ENCOUNTER — Other Ambulatory Visit: Payer: Self-pay

## 2018-02-21 DIAGNOSIS — R0602 Shortness of breath: Secondary | ICD-10-CM

## 2018-02-21 MED ORDER — ALBUTEROL SULFATE HFA 108 (90 BASE) MCG/ACT IN AERS: 1.0000 | INHALATION_SPRAY | Freq: Four times a day (QID) | RESPIRATORY_TRACT | 2 refills | Status: DC | PRN

## 2018-03-15 ENCOUNTER — Ambulatory Visit: Payer: Self-pay | Admitting: Infectious Diseases

## 2018-03-21 ENCOUNTER — Other Ambulatory Visit: Payer: Self-pay

## 2018-03-21 ENCOUNTER — Encounter (HOSPITAL_COMMUNITY): Payer: Self-pay | Admitting: Emergency Medicine

## 2018-03-21 ENCOUNTER — Emergency Department (HOSPITAL_COMMUNITY)
Admission: EM | Admit: 2018-03-21 | Discharge: 2018-03-21 | Disposition: A | Discharge status: Home / Self Care | Payer: Medicare Other | Attending: Emergency Medicine | Admitting: Emergency Medicine

## 2018-03-21 DIAGNOSIS — I2510 Atherosclerotic heart disease of native coronary artery without angina pectoris: Secondary | ICD-10-CM | POA: Diagnosis not present

## 2018-03-21 DIAGNOSIS — F5103 Paradoxical insomnia: Secondary | ICD-10-CM | POA: Insufficient documentation

## 2018-03-21 DIAGNOSIS — Z21 Asymptomatic human immunodeficiency virus [HIV] infection status: Secondary | ICD-10-CM | POA: Diagnosis not present

## 2018-03-21 DIAGNOSIS — J449 Chronic obstructive pulmonary disease, unspecified: Secondary | ICD-10-CM | POA: Insufficient documentation

## 2018-03-21 DIAGNOSIS — Z9114 Patient's other noncompliance with medication regimen: Secondary | ICD-10-CM | POA: Diagnosis not present

## 2018-03-21 DIAGNOSIS — D320 Benign neoplasm of cerebral meninges: Secondary | ICD-10-CM | POA: Insufficient documentation

## 2018-03-21 DIAGNOSIS — R634 Abnormal weight loss: Secondary | ICD-10-CM | POA: Diagnosis not present

## 2018-03-21 DIAGNOSIS — F17210 Nicotine dependence, cigarettes, uncomplicated: Secondary | ICD-10-CM | POA: Diagnosis not present

## 2018-03-21 DIAGNOSIS — R531 Weakness: Secondary | ICD-10-CM

## 2018-03-21 DIAGNOSIS — R6883 Chills (without fever): Secondary | ICD-10-CM | POA: Insufficient documentation

## 2018-03-21 DIAGNOSIS — Z79899 Other long term (current) drug therapy: Secondary | ICD-10-CM | POA: Insufficient documentation

## 2018-03-21 DIAGNOSIS — R5383 Other fatigue: Secondary | ICD-10-CM | POA: Diagnosis present

## 2018-03-21 LAB — RAPID URINE DRUG SCREEN, HOSP PERFORMED
Amphetamines: POSITIVE — AB
Barbiturates: NOT DETECTED
Benzodiazepines: NOT DETECTED
Cocaine: NOT DETECTED
Opiates: NOT DETECTED
Tetrahydrocannabinol: POSITIVE — AB

## 2018-03-21 LAB — COMPREHENSIVE METABOLIC PANEL
ALT: 15 U/L — ABNORMAL LOW (ref 17–63)
AST: 20 U/L (ref 15–41)
Albumin: 4.1 g/dL (ref 3.5–5.0)
Alkaline Phosphatase: 81 U/L (ref 38–126)
Anion gap: 9 (ref 5–15)
BUN: 13 mg/dL (ref 6–20)
CO2: 28 mmol/L (ref 22–32)
Calcium: 9.7 mg/dL (ref 8.9–10.3)
Chloride: 103 mmol/L (ref 101–111)
Creatinine, Ser: 1.49 mg/dL — ABNORMAL HIGH (ref 0.61–1.24)
GFR calc Af Amer: >60 mL/min (ref >60)
GFR calc non Af Amer: 56 mL/min — ABNORMAL LOW (ref >60)
Glucose, Bld: 91 mg/dL (ref 65–99)
Potassium: 4.1 mmol/L (ref 3.5–5.1)
Sodium: 140 mmol/L (ref 135–145)
Total Bilirubin: 0.6 mg/dL (ref 0.3–1.2)
Total Protein: 7.5 g/dL (ref 6.5–8.1)

## 2018-03-21 LAB — CBC
HCT: 49.1 % (ref 39.0–52.0)
Hemoglobin: 16.1 g/dL (ref 13.0–17.0)
MCH: 30.7 pg (ref 26.0–34.0)
MCHC: 32.8 g/dL (ref 30.0–36.0)
MCV: 93.7 fL (ref 78.0–100.0)
Platelets: 409 10*3/uL — ABNORMAL HIGH (ref 150–400)
RBC: 5.24 MIL/uL (ref 4.22–5.81)
RDW: 13.6 % (ref 11.5–15.5)
WBC: 8.4 10*3/uL (ref 4.0–10.5)

## 2018-03-21 LAB — URINALYSIS, ROUTINE W REFLEX MICROSCOPIC
Bacteria, UA: NONE SEEN
Bilirubin Urine: NEGATIVE
Glucose, UA: NEGATIVE mg/dL
Ketones, ur: NEGATIVE mg/dL
Leukocytes, UA: NEGATIVE
Nitrite: NEGATIVE
Protein, ur: NEGATIVE mg/dL
Specific Gravity, Urine: 1.024 (ref 1.005–1.030)
pH: 5 (ref 5.0–8.0)

## 2018-03-21 LAB — LIPASE, BLOOD: Lipase: 27 U/L (ref 11–51)

## 2018-03-21 LAB — SALICYLATE LEVEL: Salicylate Lvl: <7 mg/dL (ref 2.8–30.0)

## 2018-03-21 LAB — TSH: TSH: 1.072 u[IU]/mL (ref 0.350–4.500)

## 2018-03-21 LAB — ACETAMINOPHEN LEVEL: Acetaminophen (Tylenol), Serum: <10 ug/mL — ABNORMAL LOW (ref 10–30)

## 2018-03-21 LAB — ETHANOL: Alcohol, Ethyl (B): <10 mg/dL (ref <10)

## 2018-03-21 MED ORDER — QUETIAPINE FUMARATE 100 MG PO TABS
100.0000 mg | ORAL_TABLET | Freq: Every day | ORAL | 0 refills | Status: DC
Start: 2018-03-21 — End: 2018-05-22

## 2018-03-21 MED ORDER — SODIUM CHLORIDE 0.9 % IV BOLUS
1000.0000 mL | Freq: Once | INTRAVENOUS | Status: AC
  Administered 2018-03-21: 1000 mL via INTRAVENOUS

## 2018-03-21 NOTE — Discharge Instructions (Signed)
As discussed, your evaluation today has been largely reassuring.  But, it is important that you monitor your condition carefully, and do not hesitate to return to the ED if you develop new, or concerning changes in your condition. ? ?Otherwise, please follow-up with your physician for appropriate ongoing care. ? ?
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
## 2018-03-21 NOTE — ED Triage Notes (Signed)
States live with 2 other people and states they are taking his medication to help him sleep. History of insomnia as a child and cannot sleep and lost unknown amount of weight. Denies pain. Denies SI or HI.  Here today for dehydration per patient.

## 2018-03-21 NOTE — ED Notes (Signed)
Spoke with doctor added additional orders for patient.

## 2018-03-22 LAB — CD4/CD8 (T-HELPER/T-SUPPRESSOR CELL)
CD4 absolute: 1120 /uL (ref 500–1900)
CD4%: 30 % (ref 30.0–60.0)
CD8 T Cell Abs: 1440 /uL — ABNORMAL HIGH (ref 230–1000)
CD8tox: 39 % (ref 15.0–40.0)
Ratio: 0.78 — ABNORMAL LOW (ref 1.0–3.0)
Total lymphocyte count: 3727 /uL (ref 1000–4000)

## 2018-05-20 ENCOUNTER — Encounter (HOSPITAL_COMMUNITY): Payer: Self-pay | Admitting: Emergency Medicine

## 2018-05-20 ENCOUNTER — Emergency Department (HOSPITAL_COMMUNITY)
Admission: EM | Admit: 2018-05-20 | Discharge: 2018-05-22 | Disposition: A | Discharge status: Home / Self Care | Payer: Medicare Other | Attending: Emergency Medicine | Admitting: Emergency Medicine

## 2018-05-20 DIAGNOSIS — J449 Chronic obstructive pulmonary disease, unspecified: Secondary | ICD-10-CM | POA: Insufficient documentation

## 2018-05-20 DIAGNOSIS — B20 Human immunodeficiency virus [HIV] disease: Secondary | ICD-10-CM | POA: Diagnosis not present

## 2018-05-20 DIAGNOSIS — M069 Rheumatoid arthritis, unspecified: Secondary | ICD-10-CM | POA: Insufficient documentation

## 2018-05-20 DIAGNOSIS — Z79899 Other long term (current) drug therapy: Secondary | ICD-10-CM | POA: Diagnosis not present

## 2018-05-20 DIAGNOSIS — R440 Auditory hallucinations: Secondary | ICD-10-CM | POA: Diagnosis present

## 2018-05-20 DIAGNOSIS — I2510 Atherosclerotic heart disease of native coronary artery without angina pectoris: Secondary | ICD-10-CM | POA: Diagnosis not present

## 2018-05-20 DIAGNOSIS — F17210 Nicotine dependence, cigarettes, uncomplicated: Secondary | ICD-10-CM | POA: Insufficient documentation

## 2018-05-20 DIAGNOSIS — F15950 Other stimulant use, unspecified with stimulant-induced psychotic disorder with delusions: Secondary | ICD-10-CM | POA: Diagnosis not present

## 2018-05-20 LAB — COMPREHENSIVE METABOLIC PANEL
ALT: 16 U/L (ref 0–44)
AST: 20 U/L (ref 15–41)
Albumin: 4 g/dL (ref 3.5–5.0)
Alkaline Phosphatase: 74 U/L (ref 38–126)
Anion gap: 6 (ref 5–15)
BUN: 22 mg/dL — ABNORMAL HIGH (ref 6–20)
CO2: 27 mmol/L (ref 22–32)
Calcium: 9.4 mg/dL (ref 8.9–10.3)
Chloride: 107 mmol/L (ref 98–111)
Creatinine, Ser: 1.14 mg/dL (ref 0.61–1.24)
GFR calc Af Amer: >60 mL/min (ref >60)
GFR calc non Af Amer: >60 mL/min (ref >60)
Glucose, Bld: 94 mg/dL (ref 70–99)
Potassium: 4 mmol/L (ref 3.5–5.1)
Sodium: 140 mmol/L (ref 135–145)
Total Bilirubin: 0.8 mg/dL (ref 0.3–1.2)
Total Protein: 7.5 g/dL (ref 6.5–8.1)

## 2018-05-20 LAB — CBC
HCT: 43 % (ref 39.0–52.0)
Hemoglobin: 14.3 g/dL (ref 13.0–17.0)
MCH: 30.5 pg (ref 26.0–34.0)
MCHC: 33.3 g/dL (ref 30.0–36.0)
MCV: 91.7 fL (ref 78.0–100.0)
Platelets: 379 10*3/uL (ref 150–400)
RBC: 4.69 MIL/uL (ref 4.22–5.81)
RDW: 14.1 % (ref 11.5–15.5)
WBC: 10.1 10*3/uL (ref 4.0–10.5)

## 2018-05-20 LAB — RAPID URINE DRUG SCREEN, HOSP PERFORMED
Amphetamines: POSITIVE — AB
Benzodiazepines: NOT DETECTED
Cocaine: NOT DETECTED
Opiates: NOT DETECTED
Tetrahydrocannabinol: NOT DETECTED

## 2018-05-20 LAB — ETHANOL: Alcohol, Ethyl (B): <10 mg/dL (ref <10)

## 2018-05-20 MED ORDER — ZIPRASIDONE MESYLATE 20 MG IM SOLR
20.0000 mg | Freq: Once | INTRAMUSCULAR | Status: AC | PRN
  Administered 2018-05-20: 20 mg via INTRAMUSCULAR

## 2018-05-20 MED ORDER — DIPHENHYDRAMINE HCL 50 MG/ML IJ SOLN
50.0000 mg | Freq: Once | INTRAMUSCULAR | Status: AC | PRN
  Administered 2018-05-20: 50 mg via INTRAMUSCULAR

## 2018-05-20 MED ORDER — EFAVIRENZ-EMTRICITAB-TENOFOVIR 600-200-300 MG PO TABS: 1.0000 | ORAL_TABLET | Freq: Every day | ORAL | Status: DC

## 2018-05-20 MED ORDER — FLUOXETINE HCL 20 MG PO CAPS
40.0000 mg | ORAL_CAPSULE | Freq: Every day | ORAL | Status: DC
  Administered 2018-05-21 – 2018-05-22 (×2): 40 mg via ORAL
  Filled 2018-05-20 (×2): qty 2

## 2018-05-20 MED ORDER — ZIPRASIDONE MESYLATE 20 MG IM SOLR
INTRAMUSCULAR | Status: AC
  Filled 2018-05-20: qty 20

## 2018-05-20 MED ORDER — DIPHENHYDRAMINE HCL 50 MG/ML IJ SOLN
INTRAMUSCULAR | Status: AC
  Filled 2018-05-20: qty 1

## 2018-05-20 MED ORDER — LORAZEPAM 2 MG/ML IJ SOLN
2.0000 mg | Freq: Once | INTRAMUSCULAR | Status: AC | PRN
  Administered 2018-05-20: 2 mg via INTRAMUSCULAR
  Filled 2018-05-20: qty 1

## 2018-05-20 MED ORDER — QUETIAPINE FUMARATE 100 MG PO TABS
100.0000 mg | ORAL_TABLET | Freq: Every day | ORAL | Status: DC
  Administered 2018-05-20: 100 mg via ORAL
  Filled 2018-05-20: qty 1

## 2018-05-20 MED ORDER — GABAPENTIN 400 MG PO CAPS
400.0000 mg | ORAL_CAPSULE | Freq: Four times a day (QID) | ORAL | Status: DC
  Administered 2018-05-20 – 2018-05-22 (×6): 400 mg via ORAL
  Filled 2018-05-20 (×7): qty 1

## 2018-05-20 NOTE — ED Notes (Signed)
Pt came to unit and went straight to sleep.  As patient was irritable earlier and was not sleeping I will assess patient when he awakens.  15 minute checks and video monitoring in place.

## 2018-05-20 NOTE — ED Notes (Signed)
Pt is loud and disruptive demanding to leave.  Pt became belligerent and cursing staff.  Then continued to his room where he started tearing up the furniture.  Pt is very entitled and delusional.   He has two very large and dangerous rings on his hand that must be removed.  Prn orders were obtained.
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
## 2018-05-20 NOTE — ED Notes (Signed)
Security was able to remove two silver colored rings from patient and lock them safely with his belongings.

## 2018-05-20 NOTE — ED Notes (Signed)
Pt sedated at present, no distress noted, calm at present.  A&O x 3.  Monitoring for safety, Q 15 min checks in effect.

## 2018-05-21 DIAGNOSIS — F15950 Other stimulant use, unspecified with stimulant-induced psychotic disorder with delusions: Secondary | ICD-10-CM

## 2018-05-21 MED ORDER — BICTEGRAVIR-EMTRICITAB-TENOFOV 50-200-25 MG PO TABS
1.0000 | ORAL_TABLET | Freq: Every day | ORAL | Status: DC
  Administered 2018-05-21 – 2018-05-22 (×2): 1 via ORAL
  Filled 2018-05-21 (×2): qty 1

## 2018-05-21 MED ORDER — HALOPERIDOL 5 MG PO TABS
5.0000 mg | ORAL_TABLET | Freq: Two times a day (BID) | ORAL | Status: DC
  Administered 2018-05-21 – 2018-05-22 (×3): 5 mg via ORAL
  Filled 2018-05-21 (×3): qty 1
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
## 2018-05-21 NOTE — BHH Counselor (Signed)
Pt quietly laying in bed with tv on. He stated to this writer he is ready to leave & he thinks the plan is for him to be discharged tomorrow. Pt calmly shared he still feels like he was done wrong & did not need to come to the hospital at all. He denies current SI, HI and AVH. Pt engaged in a pleasant, cooperative & appropriate manner.
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
## 2018-05-21 NOTE — ED Notes (Signed)
Pt A&O x 3, sleeping at present. No distress noted, calm & cooperative.  Monitoring for safety, Q 15 min checks in effect.

## 2018-05-22 ENCOUNTER — Other Ambulatory Visit: Payer: Self-pay

## 2018-05-22 DIAGNOSIS — F15950 Other stimulant use, unspecified with stimulant-induced psychotic disorder with delusions: Secondary | ICD-10-CM | POA: Diagnosis not present

## 2018-05-22 MED ORDER — HALOPERIDOL 5 MG PO TABS: 5.0000 mg | ORAL_TABLET | Freq: Two times a day (BID) | ORAL | 0 refills | Status: DC

## 2018-05-22 MED ORDER — TRAZODONE HCL 100 MG PO TABS: 100.0000 mg | ORAL_TABLET | Freq: Every day | ORAL | 0 refills | Status: DC

## 2018-05-22 MED ORDER — GABAPENTIN 400 MG PO CAPS: 400.0000 mg | ORAL_CAPSULE | Freq: Four times a day (QID) | ORAL | 0 refills | Status: DC

## 2018-05-22 NOTE — Discharge Instructions (Signed)
For your behavioral health needs, you are advised to follow up with one of the following providers.  Contact them at your earliest opportunity to ask about scheduling an intake appointment:       [facility], [place] [address]      [phone]      New patients are seen at their walk-in clinic.  Walk-in hours are Monday - Friday from 8:00 am - 12:00 pm, and from 1:00 pm - 3:00 pm.  Walk-in patients are seen on a first come, first served basis, so try to arrive as early as possible for the best chance of being seen the same day.  There is an initial fee of $22.50.       [facility]      [place], [place] [address]      [phone]

## 2018-05-22 NOTE — ED Notes (Addendum)
IVC has been rescinded. Written dc instructions and prescriptions reviewed with pt. Pt encouraged to follow up as directed and take medications as directed.  Pt verbalized understanding.  Pt denies si/hi/avh on dc, ambulatory w/o difficulty to dc area,belongings returned after leaving the area.

## 2018-05-22 NOTE — ED Notes (Signed)
Dr Mariea Clonts and Theodoro Clock DNP into see.  PT resting quietly, nad, denies si/hi/avh at this time.
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
## 2018-05-22 NOTE — BHH Suicide Risk Assessment (Signed)
Suicide Risk Assessment  Discharge Assessment   [facility] Discharge Suicide Risk Assessment   Principal Problem: Amphetamine and psychostimulant-induced psychosis with delusions [facility]) Discharge Diagnoses:  Patient Active Problem List   Diagnosis Date Noted  . Amphetamine and psychostimulant-induced psychosis with delusions [facility]) [F15.950] 05/21/2018    Priority: High  . Substance induced mood disorder ([place]) [F19.94] 01/24/2018  . Abnormal liver function [R94.5] 11/23/2017  . Chronic pain [G89.29]   . HIV (human immunodeficiency virus infection) ([place]) [B20] 11/20/2017  . Rheumatoid arthritis ([place]) [M06.9] 11/20/2017  . Fibromyalgia [M79.7] 11/20/2017  . History of syphilis [Z86.19] 01/14/2015    Total Time spent with patient: 30 minutes   Musculoskeletal: Strength & Muscle Tone: within normal limits Gait & Station: normal Patient leans: N/A  Psychiatric Specialty Exam: Physical Exam  Constitutional: He is oriented to person, place, and time. He appears well-developed and well-nourished.  HENT:  Head: Normocephalic.  Neck: Normal range of motion.  Respiratory: Effort normal.  Musculoskeletal: Normal range of motion.  Neurological: He is alert and oriented to person, place, and time.  Psychiatric: His speech is normal and behavior is normal. Judgment and thought content normal. His mood appears anxious. Cognition and memory are normal.    Review of Systems  Psychiatric/Behavioral: Positive for substance abuse. The patient is nervous/anxious.   All other systems reviewed and are negative.   Blood pressure 114/73, pulse 92, temperature 98.6 F (37 C), temperature source Oral, resp. rate 19, SpO2 100 %.There is no height or weight on file to calculate BMI.  General Appearance: Casual  Eye Contact:  Good  Speech:  Normal Rate  Volume:  Normal  Mood:  Anxious, mild  Affect:  Appropriate  Thought Process:  Coherent and Descriptions of Associations: Intact  Orientation:  Full  (Time, Place, and Person)  Thought Content:  WDL   Suicidal Thoughts:  No  Homicidal Thoughts:  No  Memory: Immediate, recent, remote--good  Judgement:  Fair  Insight:  lacking  Psychomotor Activity:  Normal  Concentration:  Concentration and attention span, good  Recall:  Good  Fund of Knowledge:  Fair  Language:  Good  Akathisia:  No  Handed:  Right  AIMS (if indicated):     Assets:  Leisure Time Physical Health Resilience Social Support  ADL's:  Intact  Cognition:  WNL  Sleep:      Mental Status Per Nursing Assessment::   On Admission:   meth abuse with paranoia and delusions  Demographic Factors:  Male  Loss Factors: NA  Historical Factors: NA  Risk Reduction Factors:   Sense of responsibility to family, Living with another person, especially a relative, Positive social support and Positive therapeutic relationship  Continued Clinical Symptoms:  Anxiety, mild  Cognitive Features That Contribute To Risk:  None    Suicide Risk:  Minimal: No identifiable suicidal ideation.  Patients presenting with no risk factors but with morbid ruminations; may be classified as minimal risk based on the severity of the depressive symptoms    Plan Of Care/Follow-up recommendations:  Activity:  as tolerated Diet:  heart healthy diet  Donato Studley, NP 05/22/2018, 11:50 AM
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
## 2018-05-24 ENCOUNTER — Emergency Department (HOSPITAL_COMMUNITY): Payer: Medicare Other

## 2018-05-24 ENCOUNTER — Emergency Department (HOSPITAL_COMMUNITY)
Admission: EM | Admit: 2018-05-24 | Discharge: 2018-05-25 | Disposition: A | Discharge status: Home / Self Care | Payer: Medicare Other | Attending: Emergency Medicine | Admitting: Emergency Medicine

## 2018-05-24 ENCOUNTER — Encounter (HOSPITAL_COMMUNITY): Payer: Self-pay | Admitting: Emergency Medicine

## 2018-05-24 DIAGNOSIS — B20 Human immunodeficiency virus [HIV] disease: Secondary | ICD-10-CM | POA: Diagnosis not present

## 2018-05-24 DIAGNOSIS — R531 Weakness: Secondary | ICD-10-CM

## 2018-05-24 DIAGNOSIS — Z79899 Other long term (current) drug therapy: Secondary | ICD-10-CM | POA: Diagnosis not present

## 2018-05-24 DIAGNOSIS — J449 Chronic obstructive pulmonary disease, unspecified: Secondary | ICD-10-CM | POA: Diagnosis not present

## 2018-05-24 DIAGNOSIS — H538 Other visual disturbances: Secondary | ICD-10-CM | POA: Diagnosis not present

## 2018-05-24 DIAGNOSIS — R42 Dizziness and giddiness: Secondary | ICD-10-CM | POA: Diagnosis not present

## 2018-05-24 DIAGNOSIS — R29818 Other symptoms and signs involving the nervous system: Secondary | ICD-10-CM | POA: Diagnosis not present

## 2018-05-24 DIAGNOSIS — K0889 Other specified disorders of teeth and supporting structures: Secondary | ICD-10-CM | POA: Insufficient documentation

## 2018-05-24 DIAGNOSIS — I2510 Atherosclerotic heart disease of native coronary artery without angina pectoris: Secondary | ICD-10-CM | POA: Diagnosis not present

## 2018-05-24 DIAGNOSIS — R5383 Other fatigue: Secondary | ICD-10-CM | POA: Diagnosis not present

## 2018-05-24 DIAGNOSIS — F17210 Nicotine dependence, cigarettes, uncomplicated: Secondary | ICD-10-CM | POA: Insufficient documentation

## 2018-05-24 LAB — CBC WITH DIFFERENTIAL/PLATELET
Abs Immature Granulocytes: 0 10*3/uL (ref 0.0–0.1)
Basophils Absolute: 0.1 10*3/uL (ref 0.0–0.1)
Basophils Relative: 1 %
Eosinophils Absolute: 0.2 10*3/uL (ref 0.0–0.7)
Eosinophils Relative: 2 %
HCT: 44.4 % (ref 39.0–52.0)
Hemoglobin: 14.3 g/dL (ref 13.0–17.0)
Immature Granulocytes: 1 %
Lymphocytes Relative: 39 %
Lymphs Abs: 2.8 10*3/uL (ref 0.7–4.0)
MCH: 30.4 pg (ref 26.0–34.0)
MCHC: 32.2 g/dL (ref 30.0–36.0)
MCV: 94.3 fL (ref 78.0–100.0)
Monocytes Absolute: 0.7 10*3/uL (ref 0.1–1.0)
Monocytes Relative: 9 %
Neutro Abs: 3.6 10*3/uL (ref 1.7–7.7)
Neutrophils Relative %: 48 %
Platelets: 323 10*3/uL (ref 150–400)
RBC: 4.71 MIL/uL (ref 4.22–5.81)
RDW: 13.9 % (ref 11.5–15.5)
WBC: 7.4 10*3/uL (ref 4.0–10.5)

## 2018-05-24 LAB — RAPID URINE DRUG SCREEN, HOSP PERFORMED
Amphetamines: NOT DETECTED
Benzodiazepines: NOT DETECTED
Cocaine: NOT DETECTED
Opiates: NOT DETECTED
Tetrahydrocannabinol: NOT DETECTED

## 2018-05-24 LAB — COMPREHENSIVE METABOLIC PANEL
ALT: 17 U/L (ref 0–44)
AST: 32 U/L (ref 15–41)
Albumin: 3.4 g/dL — ABNORMAL LOW (ref 3.5–5.0)
Alkaline Phosphatase: 66 U/L (ref 38–126)
Anion gap: 7 (ref 5–15)
BUN: 11 mg/dL (ref 6–20)
CO2: 29 mmol/L (ref 22–32)
Calcium: 8.8 mg/dL — ABNORMAL LOW (ref 8.9–10.3)
Chloride: 100 mmol/L (ref 98–111)
Creatinine, Ser: 1.35 mg/dL — ABNORMAL HIGH (ref 0.61–1.24)
GFR calc Af Amer: >60 mL/min (ref >60)
GFR calc non Af Amer: >60 mL/min (ref >60)
Glucose, Bld: 92 mg/dL (ref 70–99)
Potassium: 5.2 mmol/L — ABNORMAL HIGH (ref 3.5–5.1)
Sodium: 136 mmol/L (ref 135–145)
Total Bilirubin: 1 mg/dL (ref 0.3–1.2)
Total Protein: 6.5 g/dL (ref 6.5–8.1)

## 2018-05-24 LAB — URINALYSIS, ROUTINE W REFLEX MICROSCOPIC
Bacteria, UA: NONE SEEN
Bilirubin Urine: NEGATIVE
Glucose, UA: NEGATIVE mg/dL
Ketones, ur: NEGATIVE mg/dL
Nitrite: NEGATIVE
Protein, ur: NEGATIVE mg/dL
Specific Gravity, Urine: 1.023 (ref 1.005–1.030)
pH: 5 (ref 5.0–8.0)

## 2018-05-24 LAB — I-STAT TROPONIN, ED: Troponin i, poc: 0 ng/mL (ref 0.00–0.08)
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
## 2018-05-24 NOTE — ED Notes (Signed)
Pt returned to room  

## 2018-05-24 NOTE — ED Notes (Signed)
Patient transported to MRI 

## 2018-05-24 NOTE — ED Notes (Signed)
Patient transported to CT 
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
## 2018-05-25 ENCOUNTER — Encounter (HOSPITAL_COMMUNITY): Payer: Self-pay | Admitting: Emergency Medicine

## 2018-05-25 ENCOUNTER — Emergency Department (HOSPITAL_COMMUNITY)
Admission: EM | Admit: 2018-05-25 | Discharge: 2018-05-25 | Disposition: A | Discharge status: Home / Self Care | Payer: Medicare Other | Source: Home / Self Care

## 2018-05-25 DIAGNOSIS — Z5321 Procedure and treatment not carried out due to patient leaving prior to being seen by health care provider: Secondary | ICD-10-CM

## 2018-05-25 DIAGNOSIS — K0889 Other specified disorders of teeth and supporting structures: Secondary | ICD-10-CM | POA: Insufficient documentation

## 2018-05-25 DIAGNOSIS — K047 Periapical abscess without sinus: Secondary | ICD-10-CM

## 2018-05-25 DIAGNOSIS — R531 Weakness: Secondary | ICD-10-CM | POA: Diagnosis not present

## 2018-05-25 MED ORDER — CLINDAMYCIN HCL 300 MG PO CAPS: 300.0000 mg | ORAL_CAPSULE | Freq: Three times a day (TID) | ORAL | 0 refills | Status: DC
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
## 2018-05-25 NOTE — ED Notes (Signed)
Pt was concerned about weakness walking out, offered pt wheelchair, pt refused. Pt walked out of room with no obvious issues.

## 2018-05-25 NOTE — ED Provider Notes (Signed)
Care assumed from previous provider PA [place]. Please see their note for further details to include full history and physical. To summarize in short pt is a 42 year old male with history of HIV, CAD, reported stroke in the past, rheumatoid arthritis who presents to the ED for right-sided weakness. Case discussed, plan agreed upon.  Time of care handoff was awaiting MRI of brain to rule out stroke.  This returned was normal.  Prior provider felt that if MRI was normal patient can be discharged.  Patient did inform me on discharge that he is been having some pain to his left lower tooth and some left lower facial swelling.  Is been ongoing for 2 to 3 days.  Patient does not have a dentist.  Denies difficulty breathing or swallowing.  Denies any fevers or chills.  On exam patient has some mild edema to the left lower gumline but no signs of gross abscess, purulent drainage, erythema.  Mild left lower facial swelling noted.  Oropharynx is clear.  No sublingual or submandibular swelling.  Managing secretions tolerating airway.  Will start patient on clindamycin for dental abscess.  Patient will be given dental resources.  Will also be given a neurology outpatient follow-up for his numbness and weakness.  Pt is hemodynamically stable, in NAD, & able to ambulate in the ED. Evaluation does not show pathology that would require ongoing emergent intervention or inpatient treatment. I explained the diagnosis to the patient. Pain has been managed & has no complaints prior to dc. Pt is comfortable with above plan and is stable for discharge at this time. All questions were answered prior to disposition. Strict return precautions for f/u to the ED were discussed. Encouraged follow up with PCP.       Doristine Devoid, PA-C 05/25/18 [place], [place], DO 05/25/18 [phone]

## 2018-05-25 NOTE — Discharge Instructions (Signed)
Your MRI was normal.  Unknown cause of your weakness.  Please follow-up with the neurologist.  They will call you for an appointment.  Also follow-up with your primary care doctor asked week for ER visit follow-up.  Return the ED with any worsening symptoms.   You have a dental injury/infection. It is very important that you get evaluated by a dentist as soon as possible. Call tomorrow to schedule an appointment. Ibuprofen and tylenol as needed for pain. Take your full course of antibiotics. Read the instructions below.  Eat a soft or liquid diet and rinse your mouth out after meals with warm water. You should see a dentist or return here at once if you have increased swelling, increased pain or uncontrolled bleeding from the site of your injury.  SEEK MEDICAL CARE IF:  You have increased pain not controlled with medicines.  You have swelling around your tooth, in your face or neck.  You have bleeding which starts, continues, or gets worse.  You have a fever >101 If you are unable to open your mouth

## 2018-05-25 NOTE — ED Notes (Signed)
Pt requested abcess in mouth be looked at, EDP informed

## 2018-05-25 NOTE — ED Triage Notes (Signed)
Called for room X1 with no answer

## 2018-05-25 NOTE — ED Notes (Signed)
Unable to locate pt in waiting room at this time.

## 2018-05-25 NOTE — ED Notes (Signed)
Unable to locate pt in lobby x3. Presumed to have left without being seen.

## 2018-06-04 ENCOUNTER — Ambulatory Visit: Payer: Self-pay | Admitting: Infectious Diseases

## 2018-06-11 ENCOUNTER — Other Ambulatory Visit: Payer: Self-pay | Admitting: Family Medicine

## 2018-07-17 ENCOUNTER — Telehealth: Payer: Self-pay

## 2018-07-17 ENCOUNTER — Other Ambulatory Visit: Payer: Self-pay

## 2018-07-17 DIAGNOSIS — B20 Human immunodeficiency virus [HIV] disease: Secondary | ICD-10-CM

## 2018-07-17 DIAGNOSIS — Z21 Asymptomatic human immunodeficiency virus [HIV] infection status: Secondary | ICD-10-CM

## 2018-07-17 MED ORDER — BICTEGRAVIR-EMTRICITAB-TENOFOV 50-200-25 MG PO TABS: 1.0000 | ORAL_TABLET | Freq: Every day | ORAL | 5 refills | Status: DC

## 2018-07-17 NOTE — Telephone Encounter (Signed)
Called patient today to schedule an appt with our office. Patient has not been seen since 11/2017 with Janene Madeira, NP. Patient was able to take my call and schedule an appt for labs and follow- up with our clinic. Patient denies missing any days of medicine since his last office visit. Will ask Colletta Maryland if it is okay to refill Biktarvy. [place]

## 2018-07-17 NOTE — Telephone Encounter (Signed)
Yes please refill x 1 month. Looking forward to seeing him in a few weeks.

## 2018-07-19 ENCOUNTER — Other Ambulatory Visit: Payer: Self-pay | Admitting: *Deleted

## 2018-07-19 DIAGNOSIS — B20 Human immunodeficiency virus [HIV] disease: Secondary | ICD-10-CM

## 2018-07-19 DIAGNOSIS — Z113 Encounter for screening for infections with a predominantly sexual mode of transmission: Secondary | ICD-10-CM

## 2018-07-22 ENCOUNTER — Other Ambulatory Visit: Payer: Medicare Other

## 2018-07-22 ENCOUNTER — Encounter (HOSPITAL_COMMUNITY): Payer: Self-pay | Admitting: *Deleted

## 2018-07-22 ENCOUNTER — Other Ambulatory Visit: Payer: Self-pay

## 2018-07-22 ENCOUNTER — Emergency Department (HOSPITAL_COMMUNITY)
Admission: EM | Admit: 2018-07-22 | Discharge: 2018-07-22 | Disposition: A | Discharge status: Home / Self Care | Payer: Medicare Other | Attending: Emergency Medicine | Admitting: Emergency Medicine

## 2018-07-22 DIAGNOSIS — Z113 Encounter for screening for infections with a predominantly sexual mode of transmission: Secondary | ICD-10-CM | POA: Diagnosis not present

## 2018-07-22 DIAGNOSIS — J449 Chronic obstructive pulmonary disease, unspecified: Secondary | ICD-10-CM | POA: Diagnosis not present

## 2018-07-22 DIAGNOSIS — Z79899 Other long term (current) drug therapy: Secondary | ICD-10-CM | POA: Insufficient documentation

## 2018-07-22 DIAGNOSIS — I2510 Atherosclerotic heart disease of native coronary artery without angina pectoris: Secondary | ICD-10-CM | POA: Insufficient documentation

## 2018-07-22 DIAGNOSIS — B20 Human immunodeficiency virus [HIV] disease: Secondary | ICD-10-CM

## 2018-07-22 DIAGNOSIS — F17210 Nicotine dependence, cigarettes, uncomplicated: Secondary | ICD-10-CM | POA: Insufficient documentation

## 2018-07-22 DIAGNOSIS — R197 Diarrhea, unspecified: Secondary | ICD-10-CM

## 2018-07-22 DIAGNOSIS — Z9049 Acquired absence of other specified parts of digestive tract: Secondary | ICD-10-CM | POA: Insufficient documentation

## 2018-07-22 DIAGNOSIS — Z21 Asymptomatic human immunodeficiency virus [HIV] infection status: Secondary | ICD-10-CM | POA: Insufficient documentation

## 2018-07-22 DIAGNOSIS — G510 Bell's palsy: Secondary | ICD-10-CM | POA: Diagnosis not present

## 2018-07-22 LAB — URINALYSIS, ROUTINE W REFLEX MICROSCOPIC
Bacteria, UA: NONE SEEN
Bilirubin Urine: NEGATIVE
Glucose, UA: NEGATIVE mg/dL
Ketones, ur: NEGATIVE mg/dL
Leukocytes, UA: NEGATIVE
Nitrite: NEGATIVE
Protein, ur: 30 mg/dL — AB
Specific Gravity, Urine: 1.028 (ref 1.005–1.030)
pH: 5 (ref 5.0–8.0)

## 2018-07-22 LAB — CBC
HCT: 46.1 % (ref 39.0–52.0)
Hemoglobin: 14.4 g/dL (ref 13.0–17.0)
MCH: 30.1 pg (ref 26.0–34.0)
MCHC: 31.2 g/dL (ref 30.0–36.0)
MCV: 96.2 fL (ref 78.0–100.0)
Platelets: 342 10*3/uL (ref 150–400)
RBC: 4.79 MIL/uL (ref 4.22–5.81)
RDW: 13.5 % (ref 11.5–15.5)
WBC: 7.8 10*3/uL (ref 4.0–10.5)

## 2018-07-22 LAB — COMPREHENSIVE METABOLIC PANEL
ALT: 41 U/L (ref 0–44)
AST: 20 U/L (ref 15–41)
Albumin: 3.4 g/dL — ABNORMAL LOW (ref 3.5–5.0)
Alkaline Phosphatase: 112 U/L (ref 38–126)
Anion gap: 11 (ref 5–15)
BUN: 8 mg/dL (ref 6–20)
CO2: 28 mmol/L (ref 22–32)
Calcium: 9.2 mg/dL (ref 8.9–10.3)
Chloride: 98 mmol/L (ref 98–111)
Creatinine, Ser: 1.35 mg/dL — ABNORMAL HIGH (ref 0.61–1.24)
GFR calc Af Amer: >60 mL/min (ref >60)
GFR calc non Af Amer: >60 mL/min (ref >60)
Glucose, Bld: 120 mg/dL — ABNORMAL HIGH (ref 70–99)
Potassium: 3.6 mmol/L (ref 3.5–5.1)
Sodium: 137 mmol/L (ref 135–145)
Total Bilirubin: 0.4 mg/dL (ref 0.3–1.2)
Total Protein: 7.1 g/dL (ref 6.5–8.1)

## 2018-07-22 LAB — LIPASE, BLOOD: Lipase: 26 U/L (ref 11–51)

## 2018-07-22 MED ORDER — LOPERAMIDE HCL 2 MG PO CAPS: 2.0000 mg | ORAL_CAPSULE | Freq: Four times a day (QID) | ORAL | 0 refills | Status: DC | PRN

## 2018-07-22 NOTE — ED Provider Notes (Signed)
[facility] EMERGENCY DEPARTMENT Provider Note   CSN: [id number] Arrival date & time: 07/22/18  1052     History   Chief Complaint Chief Complaint  Patient presents with  . Abdominal Pain  . Diarrhea    HPI Jonathan Rangel is a 42 y.o. male.  HPI   42 year old male presents today with complaints of diarrhea.  Patient notes symptoms started approximately 1 week ago with crampy abdominal discomfort and greenish diarrhea.  He notes this is very loose, he denies any fever, nausea or vomiting.  He denies any blood in his stools.  Patient notes no abnormal food exposure.  He does report a history of HIV but notes he has been taking his medications directed (last CD4 count in May normal).  Patient notes he has been using tumor, garlic, and onions to help resolve his abdominal cramping which she notes has improved it.  Patient notes he has been taking his HIV medication as directed.  Past Medical History:  Diagnosis Date  . Anal fistula   . Bell's palsy   . Benign neoplasm of meninges ([place])    gamma knife 05/28/15  . CAD (coronary artery disease)   . Chronic pain   . COPD (chronic obstructive pulmonary disease) ([place])   . Headache   . HIV (human immunodeficiency virus infection) ([place]) 12/27/2002    Patient Active Problem List   Diagnosis Date Noted  . Amphetamine and psychostimulant-induced psychosis with delusions ([place]) 05/21/2018  . Substance induced mood disorder ([place]) 01/24/2018  . Abnormal liver function 11/23/2017  . Chronic pain   . HIV (human immunodeficiency virus infection) ([place]) 11/20/2017  . Rheumatoid arthritis ([place]) 11/20/2017  . Fibromyalgia 11/20/2017  . History of syphilis 01/14/2015    Past Surgical History:  Procedure Laterality Date  . CARPAL TUNNEL RELEASE    . CHOLECYSTECTOMY    . COLONOSCOPY  2015   patient reported normal   . Stereotactic destruction of lesion using gamma radiation  05/28/2015   right temporal lobe   . TONSILLECTOMY           Home Medications    Prior to Admission medications   Medication Sig Start Date End Date Taking? Authorizing Provider  albuterol (PROVENTIL HFA;VENTOLIN HFA) 108 (90 Base) MCG/ACT inhaler Inhale 1-2 puffs into the lungs every 6 (six) hours as needed for wheezing or shortness of breath. 02/21/18   Gotham Callas, NP  bictegravir-emtricitabine-tenofovir AF (BIKTARVY) 50-200-25 MG TABS tablet Take 1 tablet by mouth daily. Try to take at the same time each day with or without food. 07/17/18   [place] Callas, NP  clindamycin (CLEOCIN) 300 MG capsule Take 1 capsule (300 mg total) by mouth 3 (three) times daily. 05/25/18   Doristine Devoid, PA-C  efavirenz-emtricitabine-tenofovir (ATRIPLA) 600-200-300 MG tablet Take 1 tablet by mouth at bedtime. Patient not taking: Reported on 01/24/2018 08/11/17   Kinnie Feil, MD  FLUoxetine (PROZAC) 40 MG capsule Take 40 mg by mouth daily.    [provider]  gabapentin (NEURONTIN) 400 MG capsule Take 1 capsule (400 mg total) by mouth 4 (four) times daily. 05/22/18   Patrecia Pour, NP  haloperidol (HALDOL) 5 MG tablet Take 1 tablet (5 mg total) by mouth 2 (two) times daily. 05/22/18   Patrecia Pour, NP  ibuprofen (ADVIL,MOTRIN) 200 MG tablet Take 600 mg by mouth every 6 (six) hours as needed for moderate pain.    [provider]  loperamide (IMODIUM) 2 MG capsule  Take 1 capsule (2 mg total) by mouth 4 (four) times daily as needed for diarrhea or loose stools. 07/22/18   Marijane Trower, Dellis Filbert, PA-C  traZODone (DESYREL) 100 MG tablet Take 1 tablet (100 mg total) by mouth at bedtime. 05/22/18   Patrecia Pour, NP    Family History Family History  Problem Relation Age of Onset  . Breast cancer Maternal Grandmother   . Cancer Maternal Aunt     Social History Social History   Tobacco Use  . Smoking status: Current Every Day Smoker    Types: Cigarettes  . Smokeless tobacco: Never Used  Substance Use Topics  . Alcohol use: Yes    . Drug use: No     Allergies   Keflex [cephalexin]   Review of Systems Review of Systems  All other systems reviewed and are negative.    Physical Exam Updated Vital Signs BP 120/76 (BP Location: Right Arm)   Pulse (!) 108   Temp 98.5 F (36.9 C) (Oral)   Resp 16   SpO2 100%   Physical Exam  Constitutional: He is oriented to person, place, and time. He appears well-developed and well-nourished.  HENT:  Head: Normocephalic and atraumatic.  Eyes: Pupils are equal, round, and reactive to light. Conjunctivae are normal. Right eye exhibits no discharge. Left eye exhibits no discharge. No scleral icterus.  Neck: Normal range of motion. No JVD present. No tracheal deviation present.  Pulmonary/Chest: Effort normal. No stridor.  Abdominal: Soft. He exhibits no distension and no mass. There is tenderness. There is no rebound and no guarding. No hernia.  Generalized minimal tenderness to palpation of abdomen, no rebound guarding, or masses  Neurological: He is alert and oriented to person, place, and time. Coordination normal.  Psychiatric: He has a normal mood and affect. His behavior is normal. Judgment and thought content normal.  Nursing note and vitals reviewed.    ED Treatments / Results  Labs (all labs ordered are listed, but only abnormal results are displayed) Labs Reviewed  COMPREHENSIVE METABOLIC PANEL - Abnormal; Notable for the following components:      Result Value   Glucose, Bld 120 (*)    Creatinine, Ser 1.35 (*)    Albumin 3.4 (*)    All other components within normal limits  URINALYSIS, ROUTINE W REFLEX MICROSCOPIC - Abnormal; Notable for the following components:   APPearance HAZY (*)    Hgb urine dipstick SMALL (*)    Protein, ur 30 (*)    All other components within normal limits  LIPASE, BLOOD  CBC    EKG None  Radiology No results found.  Procedures Procedures (including critical care time)  Medications Ordered in ED Medications - No  data to display   Initial Impression / Assessment and Plan / ED Course  I have reviewed the triage vital signs and the nursing notes.  Pertinent labs & imaging results that were available during my care of the patient were reviewed by me and considered in my medical decision making (see chart for details).     Labs: Lipase, CMP, CBC, urinalysis  Imaging:  Consults:  Therapeutics:  Discharge Meds: Imodium  Assessment/Plan: 42 year old male presents today with diarrhea.  This is been 1 week and nature, his labs are reassuring at this time.  He has a nonfocal abdomen, no tachycardia, fever, or vision white count.  Patient does have HIV but has been taking his medication as directed, chart review shows his last CD4 count in May was normal,  low suspicion for opportunistic infection.  Patient will be treated with Imodium, outpatient follow-up if symptoms persist within the next week, strict return precautions.  She verbalized understanding and agreement to today's plan had no further questions or concerns.      Final Clinical Impressions(s) / ED Diagnoses   Final diagnoses:  Diarrhea, unspecified type    ED Discharge Orders         Ordered    loperamide (IMODIUM) 2 MG capsule  4 times daily PRN     07/22/18 1328           Okey Regal, PA-C 07/22/18 1328    Tegeler, Gwenyth Allegra, MD 07/23/18 [phone]

## 2018-07-22 NOTE — Discharge Instructions (Addendum)
Please read attached information. If you experience any new or worsening signs or symptoms please return to the emergency room for evaluation. Please follow-up with your primary care provider or specialist as discussed. Please use medication prescribed only as directed and discontinue taking if you have any concerning signs or symptoms.   °

## 2018-07-22 NOTE — ED Triage Notes (Signed)
Pt reports abd cramping x 1 week. Reports diarrhea, no vomiting.

## 2018-07-23 ENCOUNTER — Encounter: Payer: Self-pay | Admitting: Neurology

## 2018-07-23 ENCOUNTER — Ambulatory Visit: Payer: Self-pay | Admitting: Neurology

## 2018-07-23 ENCOUNTER — Telehealth: Payer: Self-pay | Admitting: *Deleted

## 2018-07-23 ENCOUNTER — Telehealth: Payer: Self-pay | Admitting: Neurology

## 2018-07-23 LAB — T-HELPER CELL (CD4) - (RCID CLINIC ONLY)
CD4 % Helper T Cell: 31 % — ABNORMAL LOW (ref 33–55)
CD4 T Cell Abs: 1110 /uL (ref 400–2700)

## 2018-07-23 NOTE — Telephone Encounter (Signed)
Pt no showed new pt appt on 07/23/2018 @ 8:00 AM.

## 2018-07-23 NOTE — Telephone Encounter (Signed)
Patient no showed to apt today

## 2018-07-24 ENCOUNTER — Telehealth: Payer: Self-pay | Admitting: Behavioral Health

## 2018-07-24 LAB — CBC WITH DIFFERENTIAL/PLATELET
Basophils Absolute: 67 cells/uL (ref 0–200)
Basophils Relative: 0.8 %
Eosinophils Absolute: 210 cells/uL (ref 15–500)
Eosinophils Relative: 2.5 %
HCT: 45 % (ref 38.5–50.0)
Hemoglobin: 15 g/dL (ref 13.2–17.1)
Lymphs Abs: 3318 cells/uL (ref 850–3900)
MCH: 30.2 pg (ref 27.0–33.0)
MCHC: 33.3 g/dL (ref 32.0–36.0)
MCV: 90.7 fL (ref 80.0–100.0)
MPV: 9.6 fL (ref 7.5–12.5)
Monocytes Relative: 19.4 %
Neutro Abs: 3175 cells/uL (ref 1500–7800)
Neutrophils Relative %: 37.8 %
Platelets: 388 10*3/uL (ref 140–400)
RBC: 4.96 10*6/uL (ref 4.20–5.80)
RDW: 13.4 % (ref 11.0–15.0)
Total Lymphocyte: 39.5 %
WBC mixed population: 1630 cells/uL — ABNORMAL HIGH (ref 200–950)
WBC: 8.4 10*3/uL (ref 3.8–10.8)

## 2018-07-24 LAB — COMPLETE METABOLIC PANEL WITH GFR
AG Ratio: 1.2 (calc) (ref 1.0–2.5)
ALT: 36 U/L (ref 9–46)
AST: 17 U/L (ref 10–40)
Albumin: 3.8 g/dL (ref 3.6–5.1)
Alkaline phosphatase (APISO): 120 U/L — ABNORMAL HIGH (ref 40–115)
BUN/Creatinine Ratio: 7 (calc) (ref 6–22)
BUN: 10 mg/dL (ref 7–25)
CO2: 30 mmol/L (ref 20–32)
Calcium: 9.6 mg/dL (ref 8.6–10.3)
Chloride: 98 mmol/L (ref 98–110)
Creat: 1.49 mg/dL — ABNORMAL HIGH (ref 0.60–1.35)
GFR, Est African American: 66 mL/min/{1.73_m2} (ref >=60)
GFR, Est Non African American: 57 mL/min/{1.73_m2} — ABNORMAL LOW (ref >=60)
Globulin: 3.3 g/dL (calc) (ref 1.9–3.7)
Glucose, Bld: 98 mg/dL (ref 65–99)
Potassium: 4.1 mmol/L (ref 3.5–5.3)
Sodium: 137 mmol/L (ref 135–146)
Total Bilirubin: 0.3 mg/dL (ref 0.2–1.2)
Total Protein: 7.1 g/dL (ref 6.1–8.1)

## 2018-07-24 LAB — FLUORESCENT TREPONEMAL AB(FTA)-IGG-BLD: Fluorescent Treponemal ABS: REACTIVE — AB

## 2018-07-24 LAB — RPR: RPR Ser Ql: REACTIVE — AB

## 2018-07-24 LAB — RPR TITER: RPR Titer: 1:32 {titer} — ABNORMAL HIGH

## 2018-07-24 LAB — HIV-1 RNA QUANT-NO REFLEX-BLD
HIV 1 RNA Quant: <20 copies/mL — AB
HIV-1 RNA Quant, Log: <1.3 Log copies/mL — AB

## 2018-07-24 NOTE — Telephone Encounter (Signed)
Thank you, Ashley

## 2018-07-24 NOTE — Telephone Encounter (Signed)
Called patient per Colletta Maryland NP, informed him it appears he has a new Syphilis infection.  Patient states he has not been treated for syphlis recently.  Patient states he has received Bicillin in the past and was able to tolerate the medication without a reaction.  Patient will come in tomorrow 07/25/2018 to be treated.  Informed patient to inform recent partners since 11/20/2017 to be tested and treated at the health department.  Explained the importance refraining from sexual activity 14 days after treatment.  Patient verbalized understanding. Pricilla Riffle RN

## 2018-07-24 NOTE — Telephone Encounter (Signed)
-----   Message from [place] Callas, NP sent at 07/24/2018  8:53 AM EDT ----- Patient's RPR strongly positive indicating new infection since January of this year - please call the patient to see if he has had any treatment for syphilis recently. If he has not he needs 2.4 million units bicilin x 1. If he reports he has had treatment if we could please ask him where and verify this has indeed occurred that would be wonderful. I see a cephalexin allergy listed but considering the reaction and the fact he has had these injections in the past OK to administer injection.   Thank you!

## 2018-07-25 ENCOUNTER — Ambulatory Visit (INDEPENDENT_AMBULATORY_CARE_PROVIDER_SITE_OTHER): Payer: Medicare Other

## 2018-07-25 DIAGNOSIS — B20 Human immunodeficiency virus [HIV] disease: Secondary | ICD-10-CM

## 2018-07-25 DIAGNOSIS — A64 Unspecified sexually transmitted disease: Secondary | ICD-10-CM | POA: Diagnosis not present

## 2018-07-25 MED ORDER — PENICILLIN G BENZATHINE 1200000 UNIT/2ML IM SUSP
1.2000 10*6.[IU] | Freq: Once | INTRAMUSCULAR | Status: AC
  Administered 2018-07-25: 1.2 10*6.[IU] via INTRAMUSCULAR

## 2018-07-25 MED ORDER — PENICILLIN G BENZATHINE 1200000 UNIT/2ML IM SUSP
1.2000 10*6.[IU] | Freq: Once | INTRAMUSCULAR | Status: AC
Start: 2018-07-25 — End: 2018-07-25
  Administered 2018-07-25: 1.2 10*6.[IU] via INTRAMUSCULAR

## 2018-07-25 NOTE — Progress Notes (Signed)
Patient received x1 bicillin injection today per Janene Madeira NP for STI treatment. Patient encouraged to notify any recent partners to also be treated at local health department. Patient declined condoms when offered. Advised always to practice safe sexual activities. Patient remained in office for 10-15 minutes to observe for any adverse reactions. No reactions noted. Patient tolerated well.  Lennex Pietila S. LPN

## 2018-07-29 ENCOUNTER — Other Ambulatory Visit (HOSPITAL_COMMUNITY)
Admission: RE | Admit: 2018-07-29 | Discharge: 2018-07-29 | Disposition: A | Discharge status: Home / Self Care | Payer: Medicare Other | Source: Ambulatory Visit | Attending: Infectious Diseases | Admitting: Infectious Diseases

## 2018-07-29 ENCOUNTER — Ambulatory Visit (INDEPENDENT_AMBULATORY_CARE_PROVIDER_SITE_OTHER): Payer: Medicare Other | Admitting: Infectious Diseases

## 2018-07-29 ENCOUNTER — Encounter: Payer: Self-pay | Admitting: Infectious Diseases

## 2018-07-29 VITALS — BP 106/65 | HR 96 | Temp 98.7°F | Wt 144.0 lb

## 2018-07-29 DIAGNOSIS — Z21 Asymptomatic human immunodeficiency virus [HIV] infection status: Secondary | ICD-10-CM | POA: Insufficient documentation

## 2018-07-29 DIAGNOSIS — Z23 Encounter for immunization: Secondary | ICD-10-CM | POA: Diagnosis not present

## 2018-07-29 DIAGNOSIS — Z0000 Encounter for general adult medical examination without abnormal findings: Secondary | ICD-10-CM

## 2018-07-29 DIAGNOSIS — B20 Human immunodeficiency virus [HIV] disease: Secondary | ICD-10-CM | POA: Diagnosis not present

## 2018-07-29 DIAGNOSIS — R0602 Shortness of breath: Secondary | ICD-10-CM | POA: Diagnosis not present

## 2018-07-29 DIAGNOSIS — Z8619 Personal history of other infectious and parasitic diseases: Secondary | ICD-10-CM | POA: Diagnosis not present

## 2018-07-29 MED ORDER — FLUOXETINE HCL 40 MG PO CAPS: 40.0000 mg | ORAL_CAPSULE | Freq: Every day | ORAL | 3 refills | Status: DC

## 2018-07-29 MED ORDER — TRAZODONE HCL 100 MG PO TABS: 100.0000 mg | ORAL_TABLET | Freq: Every day | ORAL | 3 refills | Status: DC

## 2018-07-29 MED ORDER — GABAPENTIN 400 MG PO CAPS: 400.0000 mg | ORAL_CAPSULE | Freq: Three times a day (TID) | ORAL | 2 refills | Status: DC

## 2018-07-29 MED ORDER — ALBUTEROL SULFATE HFA 108 (90 BASE) MCG/ACT IN AERS: 1.0000 | INHALATION_SPRAY | Freq: Four times a day (QID) | RESPIRATORY_TRACT | 2 refills | Status: DC | PRN

## 2018-07-29 MED ORDER — BICTEGRAVIR-EMTRICITAB-TENOFOV 50-200-25 MG PO TABS: 1.0000 | ORAL_TABLET | Freq: Every day | ORAL | 5 refills | Status: DC

## 2018-07-29 MED ORDER — TRAZODONE HCL 100 MG PO TABS: 100.0000 mg | ORAL_TABLET | Freq: Every day | ORAL | 0 refills | Status: DC

## 2018-07-29 NOTE — Progress Notes (Signed)
Name: Jonathan Rangel  Date of Birth: [birth year]  MRN: [id number]  PCP: [place] Callas, NP   Patient Active Problem List   Diagnosis Date Noted  . Healthcare maintenance 07/30/2018  . Amphetamine and psychostimulant-induced psychosis with delusions ([place]) 05/21/2018  . Substance induced mood disorder ([place]) 01/24/2018  . Abnormal liver function 11/23/2017  . Chronic pain   . HIV (human immunodeficiency virus infection) ([place]) 11/20/2017  . Rheumatoid arthritis ([place]) 11/20/2017  . Fibromyalgia 11/20/2017  . History of syphilis 01/14/2015   SUBJECTIVE:  Brief Narrative:  Jonathan Rangel is a 42 y.o. man with HIV disease first diagnosed in 2012. He has been historically under excellent control. In care previously in [place]. HIV Risk: MSM. HIV OIs: none   Previous Regimens:   Atripla >> suppressed  Biktarvy 2018 >> suppressed   Chief Complaint  Patient presents with  . Follow-up     HPI/ROS:  Jonathan Rangel is here today for follow up on his HIV disease. He has had several ED visits for a variety of things; most recently with abdominal pain/diarrhea. He has been out of his Biktarvy for 3 days as he has missed a few appointments with me for follow up. He is out of several other medications too that he wishes to be refilled. He has no concerns today over his health and in a great mood. Much better with regards to housing and resources. He helps provide care to his mother. He does not have a PCP here. Has had a few sexual partners and recently with routine screening found to have RPR with elevated titer - he has had his necessary injection of Bicillin for this at our clinic. Does not use condoms routinely.   Review of Systems  Constitutional: Negative for chills, fever, malaise/fatigue and weight loss.  HENT: Negative for sore throat.        No dental problems  Respiratory: Negative for cough and sputum production.   Cardiovascular: Negative for chest pain and leg swelling.  Gastrointestinal:  Negative for abdominal pain, diarrhea and vomiting.  Genitourinary: Negative for dysuria and flank pain.  Musculoskeletal: Negative for back pain, joint pain, myalgias and neck pain.  Skin: Negative for rash.  Neurological: Negative for dizziness, tingling and headaches.  Psychiatric/Behavioral: Negative for depression and substance abuse. The patient is not nervous/anxious and does not have insomnia.     Past Medical History:  Diagnosis Date  . Anal fistula   . Bell's palsy   . Benign neoplasm of meninges ([place])    gamma knife 05/28/15  . CAD (coronary artery disease)   . Chronic pain   . COPD (chronic obstructive pulmonary disease) ([place])   . Headache   . HIV (human immunodeficiency virus infection) ([place]) 12/27/2002    Allergies  Allergen Reactions  . Keflex [Cephalexin] Palpitations    Social History   Tobacco Use  . Smoking status: Current Every Day Smoker    Types: Cigarettes  . Smokeless tobacco: Never Used  Substance Use Topics  . Alcohol use: Yes  . Drug use: No    Family History  Problem Relation Age of Onset  . Breast cancer Maternal Grandmother   . Cancer Maternal Aunt     Social History   Substance and Sexual Activity  Sexual Activity Never    Physical Exam and Objective Findings:  Vitals:   07/29/18 1529  BP: 106/65  Pulse: 96  Temp: 98.7 F (37.1 C)  TempSrc: Oral  Weight: 144 lb (65.3 kg)  Body mass index is 21.9 kg/m.  Physical Exam  Constitutional: He is oriented to person, place, and time and well-developed, well-nourished, and in no distress.  HENT:  Mouth/Throat: No oral lesions. Normal dentition. No dental caries.  Eyes: No scleral icterus.  Cardiovascular: Normal rate, regular rhythm and normal heart sounds.  Pulmonary/Chest: Effort normal and breath sounds normal.  Abdominal: Soft. He exhibits no distension. There is no tenderness.  Lymphadenopathy:    He has no cervical adenopathy.  Neurological: He is alert and oriented to  person, place, and time.  Skin: Skin is warm and dry. No rash noted.  Psychiatric: Mood and affect normal.  Vitals reviewed.   Lab Results Lab Results  Component Value Date   WBC 7.8 07/22/2018   HGB 14.4 07/22/2018   HCT 46.1 07/22/2018   MCV 96.2 07/22/2018   PLT 342 07/22/2018    Lab Results  Component Value Date   CREATININE 0.86 07/29/2018   BUN 8 07/29/2018   NA 143 07/29/2018   K 4.2 07/29/2018   CL 104 07/29/2018   CO2 32 07/29/2018    Lab Results  Component Value Date   ALT 20 07/29/2018   AST 21 07/29/2018   ALKPHOS 112 07/22/2018   BILITOT 0.2 07/29/2018    Lab Results  Component Value Date   CHOL 230 (H) 11/20/2017   HDL 59 11/20/2017   LDLCALC 142 (H) 11/20/2017   TRIG 154 (H) 11/20/2017   CHOLHDL 3.9 11/20/2017   HIV 1 RNA Quant (copies/mL)  Date Value  07/22/2018 <20 DETECTED (A)  11/20/2017 30 (H)   CD4 T Cell Abs (/uL)  Date Value  07/22/2018 1,110  11/20/2017 1,120   Lab Results  Component Value Date   HAV REACTIVE (A) 11/20/2017   Lab Results  Component Value Date   HEPBSAG NON-REACTIVE 11/20/2017   HEPBSAB BORDERLINE (A) 11/20/2017   ASSESSEMTN & PLAN:  Problem List Items Addressed This Visit      Other   HIV (human immunodeficiency virus infection) ([place]) (Chronic)    Excellent control on Biktarvy with healthy CD4 count. Will check labs today as it has been sometime but he reports to have had access to his medications. Recent CMET/CBC on file. Discussed U=U concept in addition to safe sex counseling and prevention of other STIs.  He can return in 4 months for routine care.       Relevant Medications   bictegravir-emtricitabine-tenofovir AF (BIKTARVY) 50-200-25 MG TABS tablet   Other Relevant Orders   Urine cytology ancillary only   Cytology (oral, anal, urethral) ancillary only   COMPLETE METABOLIC PANEL WITH GFR (Completed)   HIV-1 RNA quant-no reflex-bld   T-helper cell (CD4)- ([facility] clinic only)   Ambulatory referral to  Internal Medicine   History of syphilis    Lab work recently c/w reinfection and treated here for early latent infection. Counseled on partner notification.       Healthcare maintenance    Will refer out to Internal Medicine for wellness and management of chronic conditions in effort to keep him out of the ER.        Other Visit Diagnoses    Shortness of breath       Relevant Medications   albuterol (PROVENTIL HFA;VENTOLIN HFA) 108 (90 Base) MCG/ACT inhaler   Need for immunization against influenza       Relevant Orders   Flu Vaccine QUAD 36+ mos IM (Completed)     Janene Madeira, MSN, NP-C [facility] Pager: [phone]  07/30/2018  1:27 PM

## 2018-07-29 NOTE — Patient Instructions (Addendum)
Will refer you to internal medicine clinic - please let us know if you have not heard from them in 2 weeks.   Refills have been sent in for you.   Will call you if any testing is concerning that we did today. But I would encourage you to sign up for MyChart to see your labs.   Flu shot today   Please call [facility] @ [phone] to schedule a dental appointment.   Please come back in 4 months.

## 2018-07-30 DIAGNOSIS — Z0000 Encounter for general adult medical examination without abnormal findings: Secondary | ICD-10-CM | POA: Insufficient documentation

## 2018-07-30 NOTE — Assessment & Plan Note (Signed)
Lab work recently c/w reinfection and treated here for early latent infection. Counseled on partner notification.

## 2018-07-30 NOTE — Assessment & Plan Note (Signed)
Excellent control on Biktarvy with healthy CD4 count. Will check labs today as it has been sometime but he reports to have had access to his medications. Recent CMET/CBC on file. Discussed U=U concept in addition to safe sex counseling and prevention of other STIs.  He can return in 4 months for routine care.

## 2018-07-30 NOTE — Assessment & Plan Note (Signed)
Will refer out to Internal Medicine for wellness and management of chronic conditions in effort to keep him out of the ER.

## 2018-07-31 LAB — HIV-1 RNA QUANT-NO REFLEX-BLD
HIV 1 RNA Quant: <20 copies/mL
HIV-1 RNA Quant, Log: <1.3 Log copies/mL

## 2018-07-31 LAB — COMPLETE METABOLIC PANEL WITH GFR
AG Ratio: 1.3 (calc) (ref 1.0–2.5)
ALT: 20 U/L (ref 9–46)
AST: 21 U/L (ref 10–40)
Albumin: 3.8 g/dL (ref 3.6–5.1)
Alkaline phosphatase (APISO): 92 U/L (ref 40–115)
BUN: 8 mg/dL (ref 7–25)
CO2: 32 mmol/L (ref 20–32)
Calcium: 9.3 mg/dL (ref 8.6–10.3)
Chloride: 104 mmol/L (ref 98–110)
Creat: 0.86 mg/dL (ref 0.60–1.35)
GFR, Est African American: 123 mL/min/{1.73_m2} (ref >=60)
GFR, Est Non African American: 106 mL/min/{1.73_m2} (ref >=60)
Globulin: 2.9 g/dL (calc) (ref 1.9–3.7)
Glucose, Bld: 74 mg/dL (ref 65–99)
Potassium: 4.2 mmol/L (ref 3.5–5.3)
Sodium: 143 mmol/L (ref 135–146)
Total Bilirubin: 0.2 mg/dL (ref 0.2–1.2)
Total Protein: 6.7 g/dL (ref 6.1–8.1)

## 2018-07-31 LAB — T-HELPER CELL (CD4) - (RCID CLINIC ONLY)
CD4 % Helper T Cell: 31 % — ABNORMAL LOW (ref 33–55)
CD4 T Cell Abs: 990 /uL (ref 400–2700)

## 2018-07-31 LAB — URINE CYTOLOGY ANCILLARY ONLY
Chlamydia: NEGATIVE
Neisseria Gonorrhea: NEGATIVE

## 2018-07-31 LAB — CYTOLOGY, (ORAL, ANAL, URETHRAL) ANCILLARY ONLY
Chlamydia: NEGATIVE
Neisseria Gonorrhea: NEGATIVE

## 2018-08-01 LAB — CYTOLOGY, (ORAL, ANAL, URETHRAL) ANCILLARY ONLY
Chlamydia: NEGATIVE
Neisseria Gonorrhea: NEGATIVE

## 2018-08-24 ENCOUNTER — Emergency Department (HOSPITAL_COMMUNITY): Payer: Medicare Other

## 2018-08-24 ENCOUNTER — Emergency Department (HOSPITAL_COMMUNITY)
Admission: EM | Admit: 2018-08-24 | Discharge: 2018-08-24 | Disposition: A | Discharge status: Home / Self Care | Payer: Medicare Other | Attending: Emergency Medicine | Admitting: Emergency Medicine

## 2018-08-24 ENCOUNTER — Encounter (HOSPITAL_COMMUNITY): Payer: Self-pay | Admitting: Emergency Medicine

## 2018-08-24 DIAGNOSIS — Z79899 Other long term (current) drug therapy: Secondary | ICD-10-CM | POA: Diagnosis not present

## 2018-08-24 DIAGNOSIS — J45909 Unspecified asthma, uncomplicated: Secondary | ICD-10-CM | POA: Diagnosis not present

## 2018-08-24 DIAGNOSIS — J449 Chronic obstructive pulmonary disease, unspecified: Secondary | ICD-10-CM | POA: Insufficient documentation

## 2018-08-24 DIAGNOSIS — F17210 Nicotine dependence, cigarettes, uncomplicated: Secondary | ICD-10-CM | POA: Diagnosis not present

## 2018-08-24 DIAGNOSIS — R05 Cough: Secondary | ICD-10-CM | POA: Diagnosis not present

## 2018-08-24 DIAGNOSIS — J069 Acute upper respiratory infection, unspecified: Secondary | ICD-10-CM | POA: Diagnosis not present

## 2018-08-24 DIAGNOSIS — I2510 Atherosclerotic heart disease of native coronary artery without angina pectoris: Secondary | ICD-10-CM | POA: Insufficient documentation

## 2018-08-24 MED ORDER — PREDNISONE 20 MG PO TABS
60.0000 mg | ORAL_TABLET | Freq: Once | ORAL | Status: AC
  Administered 2018-08-24: 60 mg via ORAL
  Filled 2018-08-24: qty 3

## 2018-08-24 MED ORDER — FLUTICASONE PROPIONATE 50 MCG/ACT NA SUSP: 1.0000 | Freq: Every day | NASAL | 2 refills | Status: DC

## 2018-08-24 MED ORDER — CETIRIZINE HCL 10 MG PO CAPS: 10.0000 mg | ORAL_CAPSULE | Freq: Every day | ORAL | 0 refills | Status: DC

## 2018-08-24 MED ORDER — NAPROXEN 250 MG PO TABS
500.0000 mg | ORAL_TABLET | Freq: Once | ORAL | Status: AC
  Administered 2018-08-24: 500 mg via ORAL
  Filled 2018-08-24: qty 2

## 2018-08-24 MED ORDER — AZITHROMYCIN 250 MG PO TABS: 250.0000 mg | ORAL_TABLET | Freq: Every day | ORAL | 0 refills | Status: DC

## 2018-08-24 MED ORDER — PREDNISONE 10 MG PO TABS: 50.0000 mg | ORAL_TABLET | Freq: Every day | ORAL | 0 refills | Status: AC

## 2018-08-24 MED ORDER — BENZONATATE 200 MG PO CAPS: 200.0000 mg | ORAL_CAPSULE | Freq: Three times a day (TID) | ORAL | 0 refills | Status: AC

## 2018-08-24 MED ORDER — BENZONATATE 100 MG PO CAPS
200.0000 mg | ORAL_CAPSULE | Freq: Once | ORAL | Status: AC
  Administered 2018-08-24: 200 mg via ORAL
  Filled 2018-08-24: qty 2

## 2018-08-24 NOTE — Discharge Instructions (Addendum)
Your chest x-ray is normal today.  You likely have a viral infection which will take time to go away. Use a saline sinus rinse twice daily, use Flonase daily. Take Zyrtec daily. If you do not have a history of high blood pressure you may take the Sudafed, asked the pharmacist for this medication. Take prednisone as prescribed starting tomorrow and complete the full course. Zithromax as an antibiotic prescribed due to your immune compromised state.  This medication is taken for 5 days in a row however continues to work in your body for a total of 10 days.  Antibiotics do not treat viral illnesses. Follow-up with your primary care provider, referral was given if needed.

## 2018-08-24 NOTE — ED Triage Notes (Signed)
Pt presents to ED for assessment of nasal congestion, cough, facial, ear and headache, white/green phlegm, fevers and chills.

## 2018-08-24 NOTE — ED Notes (Signed)
Patient verbalizes understanding of discharge instructions. Opportunity for questioning and answers were provided. Armband removed by staff, pt discharged from ED.  

## 2018-08-24 NOTE — ED Provider Notes (Signed)
[facility] EMERGENCY DEPARTMENT Provider Note   CSN: [id number] Arrival date & time: 08/24/18  1523     History   Chief Complaint Chief Complaint  Patient presents with  . URI    HPI Jonathan Rangel is a 42 y.o. male.  42 year old male with history of asthma and HIV (well controlled with current meds, compliant, sees ID and labs are UTD) presents with complaint of cough, congestion, body aches.  Symptoms started about 1 week ago, using his albuterol inhaler and nebulizer with relief until this morning when he felt like he did not get relief from his albuterol nebulizer.  Patient does not take any other asthma controller medications.  Is not taking any medications for symptom relief.  Denies fevers, chills.  States cough is productive with green to yellow sputum.  Exposed to his significant other who has had similar symptoms.  No other complaints or concerns.     Past Medical History:  Diagnosis Date  . Anal fistula   . Bell's palsy   . Benign neoplasm of meninges ([place])    gamma knife 05/28/15  . CAD (coronary artery disease)   . Chronic pain   . COPD (chronic obstructive pulmonary disease) ([place])   . Headache   . HIV (human immunodeficiency virus infection) ([place]) 12/27/2002    Patient Active Problem List   Diagnosis Date Noted  . Healthcare maintenance 07/30/2018  . Amphetamine and psychostimulant-induced psychosis with delusions ([place]) 05/21/2018  . Substance induced mood disorder ([place]) 01/24/2018  . Abnormal liver function 11/23/2017  . Chronic pain   . HIV (human immunodeficiency virus infection) ([place]) 11/20/2017  . Rheumatoid arthritis ([place]) 11/20/2017  . Fibromyalgia 11/20/2017  . History of syphilis 01/14/2015    Past Surgical History:  Procedure Laterality Date  . CARPAL TUNNEL RELEASE    . CHOLECYSTECTOMY    . COLONOSCOPY  2015   patient reported normal   . Stereotactic destruction of lesion using gamma radiation  05/28/2015   right  temporal lobe   . TONSILLECTOMY          Home Medications    Prior to Admission medications   Medication Sig Start Date End Date Taking? Authorizing Provider  albuterol (PROVENTIL HFA;VENTOLIN HFA) 108 (90 Base) MCG/ACT inhaler Inhale 1-2 puffs into the lungs every 6 (six) hours as needed for wheezing or shortness of breath. 07/29/18   [place] Callas, NP  azithromycin (ZITHROMAX) 250 MG tablet Take 1 tablet (250 mg total) by mouth daily. Take first 2 tablets together, then 1 every day until finished. 08/24/18   Tacy Learn, PA-C  benzonatate (TESSALON) 200 MG capsule Take 1 capsule (200 mg total) by mouth every 8 (eight) hours for 10 days. 08/24/18 09/03/18  Tacy Learn, PA-C  bictegravir-emtricitabine-tenofovir AF (BIKTARVY) 50-200-25 MG TABS tablet Take 1 tablet by mouth daily. Try to take at the same time each day with or without food. 07/29/18   [place] Callas, NP  Cetirizine HCl (ZYRTEC ALLERGY) 10 MG CAPS Take 1 capsule (10 mg total) by mouth daily. 08/24/18   Tacy Learn, PA-C  FLUoxetine (PROZAC) 40 MG capsule Take 1 capsule (40 mg total) by mouth daily. 07/29/18   [place] Callas, NP  fluticasone (FLONASE) 50 MCG/ACT nasal spray Place 1 spray into both nostrils daily. 08/24/18   Tacy Learn, PA-C  gabapentin (NEURONTIN) 400 MG capsule Take 1 capsule (400 mg total) by mouth 3 (three) times daily. 07/29/18   Bernie Callas,  NP  ibuprofen (ADVIL,MOTRIN) 200 MG tablet Take 600 mg by mouth every 6 (six) hours as needed for moderate pain.    [provider]  loperamide (IMODIUM) 2 MG capsule Take 1 capsule (2 mg total) by mouth 4 (four) times daily as needed for diarrhea or loose stools. Patient not taking: Reported on 07/29/2018 07/22/18   Hedges, Dellis Filbert, PA-C  predniSONE (DELTASONE) 10 MG tablet Take 5 tablets (50 mg total) by mouth daily for 4 days. 08/24/18 08/28/18  Tacy Learn, PA-C  traZODone (DESYREL) 100 MG tablet Take 1 tablet (100 mg total)  by mouth at bedtime. 07/29/18   [place] Callas, NP    Family History Family History  Problem Relation Age of Onset  . Breast cancer Maternal Grandmother   . Cancer Maternal Aunt     Social History Social History   Tobacco Use  . Smoking status: Current Every Day Smoker    Types: Cigarettes  . Smokeless tobacco: Never Used  Substance Use Topics  . Alcohol use: Yes  . Drug use: No     Allergies   Keflex [cephalexin]   Review of Systems Review of Systems  Constitutional: Negative for chills, diaphoresis, fatigue and fever.  HENT: Positive for congestion, postnasal drip and sinus pressure. Negative for ear pain, facial swelling, rhinorrhea, sinus pain, sneezing and sore throat.   Respiratory: Positive for cough and wheezing. Negative for chest tightness and shortness of breath.   Cardiovascular: Negative for chest pain.  Gastrointestinal: Negative for nausea and vomiting.  Musculoskeletal: Positive for arthralgias and myalgias. Negative for joint swelling.  Skin: Negative for rash and wound.  Allergic/Immunologic: Positive for immunocompromised state.  Neurological: Negative for weakness and headaches.  Hematological: Negative for adenopathy.  Psychiatric/Behavioral: Negative for confusion.  All other systems reviewed and are negative.    Physical Exam Updated Vital Signs BP 122/82 (BP Location: Right Arm)   Pulse 83   Temp 98.6 F (37 C) (Oral)   Resp 18   SpO2 100%   Physical Exam  Constitutional: He is oriented to person, place, and time. He appears well-developed and well-nourished. No distress.  HENT:  Head: Normocephalic and atraumatic.  Right Ear: External ear normal. Tympanic membrane is not injected, not erythematous and not bulging. A middle ear effusion is present.  Left Ear: External ear normal. Tympanic membrane is not injected, not erythematous and not bulging. A middle ear effusion is present.  Nose: Mucosal edema present. Right sinus exhibits  no maxillary sinus tenderness and no frontal sinus tenderness. Left sinus exhibits no maxillary sinus tenderness and no frontal sinus tenderness.  Mouth/Throat: Uvula is midline and mucous membranes are normal. No trismus in the jaw. Posterior oropharyngeal erythema present. No oropharyngeal exudate, posterior oropharyngeal edema or tonsillar abscesses. No tonsillar exudate.  Eyes: Right eye exhibits no discharge. Left eye exhibits no discharge.  Neck: Neck supple.  Cardiovascular: Normal rate, regular rhythm, normal heart sounds and intact distal pulses.  No murmur heard. Pulmonary/Chest: Effort normal and breath sounds normal. No respiratory distress.  Lymphadenopathy:    He has no cervical adenopathy.  Neurological: He is alert and oriented to person, place, and time.  Skin: Skin is warm and dry. No rash noted. He is not diaphoretic.  Psychiatric: He has a normal mood and affect. His behavior is normal.  Nursing note and vitals reviewed.    ED Treatments / Results  Labs (all labs ordered are listed, but only abnormal results are displayed) Labs Reviewed - No  data to display  EKG None  Radiology Dg Chest 2 View  Result Date: 08/24/2018 CLINICAL DATA:  Cough, congestion, and fever. EXAM: CHEST - 2 VIEW COMPARISON:  None. FINDINGS: The heart size and mediastinal contours are within normal limits. Both lungs are clear. The visualized skeletal structures are unremarkable. IMPRESSION: No active cardiopulmonary disease. Electronically Signed   By: Titus Dubin M.D.   On: 08/24/2018 16:20    Procedures Procedures (including critical care time)  Medications Ordered in ED Medications  naproxen (NAPROSYN) tablet 500 mg (500 mg Oral Given 08/24/18 1639)  benzonatate (TESSALON) capsule 200 mg (200 mg Oral Given 08/24/18 1640)  predniSONE (DELTASONE) tablet 60 mg (60 mg Oral Given 08/24/18 1640)     Initial Impression / Assessment and Plan / ED Course  I have reviewed the triage  vital signs and the nursing notes.  Pertinent labs & imaging results that were available during my care of the patient were reviewed by me and considered in my medical decision making (see chart for details).  Clinical Course as of Aug 24 1653  Sat Aug 24, 5136  6255 42 year old male with history of asthma and HIV with likely viral URI.  Chest x-ray is clear, lung sounds are normal.  Recommend saline sinus rinse, Zyrtec, Flonase, Sudafed.  Given prescription for prednisone due to asthma history and report of minimal relief with inhaler nebulizer.  Given Tessalon for his cough.  Patient given prescription for Zithromax due to immune compromised state and asthma history.  Commend follow-up with PCP, referral given if needed, return to ER for worsening or concerning symptoms.   [LM]    Clinical Course User Index [LM] Tacy Learn, PA-C   Final Clinical Impressions(s) / ED Diagnoses   Final diagnoses:  Upper respiratory tract infection, unspecified type  Moderate asthma, unspecified whether complicated, unspecified whether persistent    ED Discharge Orders         Ordered    predniSONE (DELTASONE) 10 MG tablet  Daily     08/24/18 1629    azithromycin (ZITHROMAX) 250 MG tablet  Daily     08/24/18 1629    benzonatate (TESSALON) 200 MG capsule  Every 8 hours     08/24/18 1629    fluticasone (FLONASE) 50 MCG/ACT nasal spray  Daily     08/24/18 1629    Cetirizine HCl (ZYRTEC ALLERGY) 10 MG CAPS  Daily     08/24/18 1629           Tacy Learn, PA-C 08/24/18 1654    Hayden Rasmussen, MD 08/25/18 1018

## 2018-10-22 ENCOUNTER — Other Ambulatory Visit: Payer: Self-pay

## 2018-10-22 ENCOUNTER — Other Ambulatory Visit: Payer: Self-pay | Admitting: Internal Medicine

## 2018-10-22 ENCOUNTER — Encounter: Payer: Self-pay | Admitting: Internal Medicine

## 2018-10-22 ENCOUNTER — Ambulatory Visit (INDEPENDENT_AMBULATORY_CARE_PROVIDER_SITE_OTHER): Payer: Medicare Other | Admitting: Internal Medicine

## 2018-10-22 VITALS — BP 124/68 | HR 69 | Temp 98.0°F | Ht 68.0 in | Wt 163.1 lb

## 2018-10-22 DIAGNOSIS — K603 Anal fistula: Secondary | ICD-10-CM | POA: Diagnosis not present

## 2018-10-22 DIAGNOSIS — M069 Rheumatoid arthritis, unspecified: Secondary | ICD-10-CM | POA: Diagnosis not present

## 2018-10-22 DIAGNOSIS — M797 Fibromyalgia: Secondary | ICD-10-CM

## 2018-10-22 DIAGNOSIS — Z8719 Personal history of other diseases of the digestive system: Secondary | ICD-10-CM

## 2018-10-22 DIAGNOSIS — Z79891 Long term (current) use of opiate analgesic: Secondary | ICD-10-CM | POA: Diagnosis not present

## 2018-10-22 MED ORDER — MUPIROCIN CALCIUM 2 % EX CREA: TOPICAL_CREAM | CUTANEOUS | 0 refills | Status: DC

## 2018-10-22 MED ORDER — AMOXICILLIN-POT CLAVULANATE 875-125 MG PO TABS: 1.0000 | ORAL_TABLET | Freq: Two times a day (BID) | ORAL | 0 refills | Status: DC

## 2018-10-22 MED ORDER — HYDROCODONE-ACETAMINOPHEN 10-325 MG PO TABS: 1.0000 | ORAL_TABLET | Freq: Three times a day (TID) | ORAL | 0 refills | Status: DC | PRN

## 2018-10-22 NOTE — Patient Instructions (Signed)
Jonathan Rangel,  It was a pleasure meeting you today.  For your anal fistula, please take Augmentin 875 mg twice a day for 5 days. Also, use the mupirocin ointment to the affected area 3 times daily as needed. We have sent in a referral to general surgery.  For your rheumatoid arthritis and fibromyalgia, we have sent in a referral to rheumatology and a prescription for a one month supply of hydrocodone 10 mg three times daily as needed.

## 2018-10-22 NOTE — Assessment & Plan Note (Addendum)
Patient reported that he is having signs and symptoms of recurrent anal fistula the past year.  He said he had an anal fistula back in 2004 that was operated on in 2005.  He does not remember what caused it or any diagnosis at that time.  Denies any history of Crohn's disease.  He reported that he noticed pus that is draining from the site that is sometimes bloody and sometimes clear.  He noticed blood in the toilet bowl into the paper but denies any blood in his stool.  He sometimes notices a pustular formation that then "burst open" reflux.  On exam and anal fistula was noted that was hard and nodular on palpation.  Patient was not extremely tender and fistula was not erythematous or draining.  Recommend that he follow-up with general surgery and a short term course of Augmentin.  Plan: -Ambulatory referral to surgery for anal fistula -Augmentin 875 mg twice daily for total of 5 days -Mupirocin ointment 3 times daily as needed

## 2018-10-22 NOTE — Assessment & Plan Note (Addendum)
Patient reported that he was diagnosed with rheumatoid arthritis back in 2013.  A mostly has bilateral upper extremity he complained of bilateral forearm and hand pain as well as bilateral knee pain.  He has been treated with prednisone in the past and did not like this.  He was being managed with methocarbamol and Lortabs 10 mg 3 times daily for both his rheumatologic pain and fibromyalgia.  He has been out of these medications for about the past year since he moved here from [place] and was unable to establish care with PCP.  He is mostly been using Biofreeze and heat therapy to manage his pain.  He said the methocarbamol did not do much for him however the hydrocodone worked well.  Reported his pain is worse during the winter months.  He is amenable to receiving a short-term course of hydrocodone until he is able to establish care with a rheumatologist here in [place].  He expressed understanding that he will need either a rheumatologist or pain med specialist to help manage these medications long-term.   Plan: - Ambulatory referral to rheumatology - 1 month supply of hydrocodone-acetaminophen 10-325 3 times daily as needed

## 2018-10-22 NOTE — Assessment & Plan Note (Signed)
Patient reported he was diagnosed with fibromyalgia back in 2013.  He was managed on methocarbamol and Norco.  He has not been taking his medications about the past year and his pain is been managed with heat therapy and Biofreeze.  He has been referred to rheumatology and given a one-month supply of Norco until he is able to establish care.

## 2018-10-24 ENCOUNTER — Telehealth: Payer: Self-pay | Admitting: *Deleted

## 2018-10-24 NOTE — Telephone Encounter (Signed)
Information was faxed to [facility] for PA for Oxycodone-Acetaminophen 10/325 mg .  Awaiting decision.  Sander Nephew, RN 10/24/2018 4:14 PM.

## 2018-10-29 NOTE — Progress Notes (Signed)
   CC: rheumatoid arthritis, fibromyalgia, anal fistula  HPI:  Jonathan Rangel is a 42 y.o. male with a PMHx listed below presenting for rheumatoid arthritis, fibromyalgia and a recurring anal fistula.   For details of today's visit and the status of his chronic medical issues please refer to the assessment and plan.   Past Medical History:  Diagnosis Date  . Anal fistula   . Bell's palsy   . Benign neoplasm of meninges ([place])    gamma knife 05/28/15  . CAD (coronary artery disease)   . Chronic pain   . COPD (chronic obstructive pulmonary disease) ([place])   . Headache   . HIV (human immunodeficiency virus infection) ([place]) 12/27/2002   Review of Systems:   Review of Systems  Constitutional: Negative for chills and fever.  Gastrointestinal: Negative for abdominal pain, blood in stool, constipation, diarrhea and melena.  Musculoskeletal: Positive for joint pain and myalgias.    Physical Exam:  Vitals:   10/22/18 1024  BP: 124/68  Pulse: 69  Temp: 98 F (36.7 C)  TempSrc: Oral  SpO2: 100%  Weight: 163 lb 1.6 oz (74 kg)  Height: 5\' 8"  (1.727 m)   Physical Exam  Constitutional: He is oriented to person, place, and time and well-developed, well-nourished, and in no distress.  Cardiovascular: Normal rate, regular rhythm and normal heart sounds.  No murmur heard. Pulmonary/Chest: Effort normal and breath sounds normal. No respiratory distress. He has no wheezes.  Abdominal: Soft. Bowel sounds are normal. He exhibits no distension. There is no abdominal tenderness.  Genitourinary:    Rectum normal.     Genitourinary Comments: Anal fistula seen on left side, no drainage or pus, skin hard to palpation, no erythema   Musculoskeletal:        General: No tenderness, deformity or edema.  Neurological: He is alert and oriented to person, place, and time.  Skin: Skin is warm and dry. No erythema.  Psychiatric: Mood, memory, affect and judgment normal.    Assessment & Plan:   See  Encounters Tab for problem based charting.  Patient seen with Dr. Rebeca Alert

## 2018-10-29 NOTE — Progress Notes (Signed)
Hey Dr. Rebeca Alert,  Not sure how that happened! Just sent you the note!  Thanks!

## 2018-10-30 MED ORDER — AMOXICILLIN-POT CLAVULANATE 875-125 MG PO TABS: 1.0000 | ORAL_TABLET | Freq: Two times a day (BID) | ORAL | 0 refills | Status: AC

## 2018-10-30 MED ORDER — MUPIROCIN 2 % EX OINT: TOPICAL_OINTMENT | CUTANEOUS | 0 refills | Status: DC

## 2018-10-30 MED ORDER — HYDROCODONE-ACETAMINOPHEN 10-325 MG PO TABS: 1.0000 | ORAL_TABLET | Freq: Three times a day (TID) | ORAL | 0 refills | Status: AC | PRN

## 2018-10-30 NOTE — Progress Notes (Signed)
Internal Medicine Clinic Attending  I saw and evaluated the patient.  I personally confirmed the key portions of the history and exam documented by Dr. Laural Golden and I reviewed pertinent patient test results.  The assessment, diagnosis, and plan were formulated together and I agree with the documentation in the resident's note.  Recurrent anal fistula vs perirectal abscess, has required surgery in the past, should see surgery again to discuss management. May need imaging to characterize. He reports significant drainage and pain, will give course of antibiotics to try to diminish inflammation.   Also has rheumatoid arthritis, needs to establish with rheumatology here. Has been on chronic hydrocodone, agreed to give 1 month supply as a 1 time prescription, made it clear that we will not be prescribing this long-term, but simply as a bridge until he can see rheumatology.   Lenice Pressman, M.D., Ph.D.

## 2018-10-30 NOTE — Addendum Note (Signed)
Addended by: Oda Kilts on: 10/30/2018 12:21 PM   Modules accepted: Orders

## 2018-10-30 NOTE — Addendum Note (Signed)
Addended by: Oda Kilts on: 10/30/2018 01:01 PM   Modules accepted: Orders

## 2018-10-31 ENCOUNTER — Other Ambulatory Visit: Payer: Self-pay | Admitting: *Deleted

## 2018-10-31 DIAGNOSIS — K603 Anal fistula: Secondary | ICD-10-CM

## 2018-10-31 MED ORDER — MUPIROCIN 2 % EX OINT: TOPICAL_OINTMENT | Freq: Three times a day (TID) | CUTANEOUS | 0 refills | Status: AC

## 2018-10-31 NOTE — Telephone Encounter (Signed)
Fax from [facility] - need clarification on directions  and quantity. Thanks

## 2018-11-19 ENCOUNTER — Telehealth: Payer: Self-pay | Admitting: *Deleted

## 2018-11-19 NOTE — Telephone Encounter (Signed)
SPOKE [place] / REFERRAL PENDING.

## 2018-11-30 ENCOUNTER — Other Ambulatory Visit: Payer: Self-pay | Admitting: Infectious Diseases

## 2018-11-30 DIAGNOSIS — R0602 Shortness of breath: Secondary | ICD-10-CM

## 2018-12-12 ENCOUNTER — Other Ambulatory Visit: Payer: Self-pay

## 2018-12-12 MED ORDER — GABAPENTIN 400 MG PO CAPS: 400.0000 mg | ORAL_CAPSULE | Freq: Three times a day (TID) | ORAL | 0 refills | Status: DC

## 2018-12-17 ENCOUNTER — Encounter: Payer: Self-pay | Admitting: *Deleted

## 2019-01-02 ENCOUNTER — Telehealth: Payer: Self-pay

## 2019-01-02 ENCOUNTER — Other Ambulatory Visit: Payer: Self-pay

## 2019-01-02 DIAGNOSIS — Z21 Asymptomatic human immunodeficiency virus [HIV] infection status: Secondary | ICD-10-CM

## 2019-01-02 MED ORDER — BICTEGRAVIR-EMTRICITAB-TENOFOV 50-200-25 MG PO TABS: 1.0000 | ORAL_TABLET | Freq: Every day | ORAL | 0 refills | Status: DC

## 2019-01-02 NOTE — Telephone Encounter (Signed)
-----   Message from Lynnell Chad sent at 01/02/2019  8:47 AM EST ----- Regarding: Medication Refill Patient called to schedule follow up appointments and asked for a medication refill before his next appointment.

## 2019-01-02 NOTE — Telephone Encounter (Signed)
Attempted to call patient regarding front desk message. Unable to reach patient at this time nor leave voicemial. Will send in one refill on [facility] to [facility] on [address] for patient.

## 2019-01-05 ENCOUNTER — Other Ambulatory Visit: Payer: Self-pay | Admitting: Infectious Diseases

## 2019-01-07 ENCOUNTER — Other Ambulatory Visit: Payer: Self-pay

## 2019-01-07 ENCOUNTER — Other Ambulatory Visit: Payer: Medicare Other

## 2019-01-07 DIAGNOSIS — Z79899 Other long term (current) drug therapy: Secondary | ICD-10-CM | POA: Diagnosis not present

## 2019-01-07 DIAGNOSIS — B20 Human immunodeficiency virus [HIV] disease: Secondary | ICD-10-CM

## 2019-01-08 LAB — T-HELPER CELL (CD4) - (RCID CLINIC ONLY)
CD4 % Helper T Cell: 30 % — ABNORMAL LOW (ref 33–55)
CD4 T Cell Abs: 1090 /uL (ref 400–2700)

## 2019-01-09 LAB — CBC WITH DIFFERENTIAL/PLATELET
Absolute Monocytes: 755 cells/uL (ref 200–950)
Basophils Absolute: 67 cells/uL (ref 0–200)
Basophils Relative: 0.9 %
Eosinophils Absolute: 118 cells/uL (ref 15–500)
Eosinophils Relative: 1.6 %
HCT: 48.5 % (ref 38.5–50.0)
Hemoglobin: 16.3 g/dL (ref 13.2–17.1)
Lymphs Abs: 3389 cells/uL (ref 850–3900)
MCH: 31 pg (ref 27.0–33.0)
MCHC: 33.6 g/dL (ref 32.0–36.0)
MCV: 92.4 fL (ref 80.0–100.0)
MPV: 10.2 fL (ref 7.5–12.5)
Monocytes Relative: 10.2 %
Neutro Abs: 3071 cells/uL (ref 1500–7800)
Neutrophils Relative %: 41.5 %
Platelets: 376 10*3/uL (ref 140–400)
RBC: 5.25 10*6/uL (ref 4.20–5.80)
RDW: 13.8 % (ref 11.0–15.0)
Total Lymphocyte: 45.8 %
WBC: 7.4 10*3/uL (ref 3.8–10.8)

## 2019-01-09 LAB — COMPLETE METABOLIC PANEL WITH GFR
AG Ratio: 1.5 (calc) (ref 1.0–2.5)
ALT: 22 U/L (ref 9–46)
AST: 38 U/L (ref 10–40)
Albumin: 4.4 g/dL (ref 3.6–5.1)
Alkaline phosphatase (APISO): 74 U/L (ref 36–130)
BUN: 18 mg/dL (ref 7–25)
CO2: 27 mmol/L (ref 20–32)
Calcium: 9.6 mg/dL (ref 8.6–10.3)
Chloride: 101 mmol/L (ref 98–110)
Creat: 1.31 mg/dL (ref 0.60–1.35)
GFR, Est African American: 77 mL/min/{1.73_m2} (ref >=60)
GFR, Est Non African American: 66 mL/min/{1.73_m2} (ref >=60)
Globulin: 3 g/dL (calc) (ref 1.9–3.7)
Glucose, Bld: 79 mg/dL (ref 65–99)
Potassium: 4.2 mmol/L (ref 3.5–5.3)
Sodium: 139 mmol/L (ref 135–146)
Total Bilirubin: 0.8 mg/dL (ref 0.2–1.2)
Total Protein: 7.4 g/dL (ref 6.1–8.1)

## 2019-01-09 LAB — LIPID PANEL
Cholesterol: 196 mg/dL (ref <200)
HDL: 50 mg/dL (ref >=40)
LDL Cholesterol (Calc): 129 mg/dL (calc) — ABNORMAL HIGH
Non-HDL Cholesterol (Calc): 146 mg/dL (calc) — ABNORMAL HIGH (ref <130)
Total CHOL/HDL Ratio: 3.9 (calc) (ref <5.0)
Triglycerides: 74 mg/dL (ref <150)

## 2019-01-09 LAB — HIV-1 RNA QUANT-NO REFLEX-BLD
HIV 1 RNA Quant: <20 copies/mL — AB
HIV-1 RNA Quant, Log: <1.3 Log copies/mL — AB

## 2019-01-21 ENCOUNTER — Encounter: Payer: Medicare Other | Admitting: Infectious Diseases

## 2019-01-22 ENCOUNTER — Ambulatory Visit (INDEPENDENT_AMBULATORY_CARE_PROVIDER_SITE_OTHER): Payer: Medicare Other | Admitting: Internal Medicine

## 2019-01-22 ENCOUNTER — Other Ambulatory Visit: Payer: Self-pay

## 2019-01-22 ENCOUNTER — Encounter: Payer: Self-pay | Admitting: Internal Medicine

## 2019-01-22 VITALS — BP 111/61 | HR 65 | Temp 98.1°F | Ht 68.0 in | Wt 147.9 lb

## 2019-01-22 DIAGNOSIS — M069 Rheumatoid arthritis, unspecified: Secondary | ICD-10-CM | POA: Diagnosis not present

## 2019-01-22 DIAGNOSIS — K603 Anal fistula: Secondary | ICD-10-CM | POA: Diagnosis not present

## 2019-01-22 DIAGNOSIS — M797 Fibromyalgia: Secondary | ICD-10-CM

## 2019-01-22 DIAGNOSIS — F419 Anxiety disorder, unspecified: Secondary | ICD-10-CM

## 2019-01-22 DIAGNOSIS — Z79891 Long term (current) use of opiate analgesic: Secondary | ICD-10-CM

## 2019-01-22 MED ORDER — HYDROCODONE-ACETAMINOPHEN 10-325 MG PO TABS: 1.0000 | ORAL_TABLET | Freq: Three times a day (TID) | ORAL | 0 refills | Status: AC

## 2019-01-22 MED ORDER — TRAZODONE HCL 100 MG PO TABS: 100.0000 mg | ORAL_TABLET | Freq: Every day | ORAL | 3 refills | Status: DC

## 2019-01-22 NOTE — Patient Instructions (Signed)
Jonathan Rangel,  It was a pleasure seeing you today. I want you to make sure you follow up with Rheumatology regarding your rheumatoid arthritis. I have sent in a 7 day prescription for your chronic pain until you are able to follow with rheumatology.  I have also sent in another General surgery referral for your anal fissure.  I want to follow up in 2 months to make sure you are able to see Rheum and General surgery. Thank you for allowing [place] to be a part of your care! Dr. Laural Golden

## 2019-01-22 NOTE — Progress Notes (Signed)
   CC: Anal fistula, Rheumatoid arthritis  HPI:  Jonathan Rangel is a 43 y.o. with a PMHx below presenting for his rheumatoid arthritis, fibromyalgia and a recurrent anal fistula. He was recently seen in December 2019 for these chronic issues but unable to follow up with rheumatology or general surgery. He states since he moved here he has had a lot going on and it made it difficult for him to make appointments. He has been having more anxiety and is amenable to a behavioral health referral as well to meet with Ms. Dessie Coma. For details of today's visit and the status of his chronic medical issues please refer to the assessment and plan.   Past Medical History:  Diagnosis Date  . Anal fistula   . Bell's palsy   . Benign neoplasm of meninges ([place])    gamma knife 05/28/15  . CAD (coronary artery disease)   . Chronic pain   . COPD (chronic obstructive pulmonary disease) ([place])   . Headache   . HIV (human immunodeficiency virus infection) ([place]) 12/27/2002   Review of Systems:   Review of Systems  Constitutional: Negative for chills, fever, malaise/fatigue and weight loss.  Gastrointestinal: Negative for abdominal pain, constipation, nausea and vomiting.  Musculoskeletal: Positive for joint pain and myalgias.  Psychiatric/Behavioral: Negative for depression. The patient is nervous/anxious.     Physical Exam:  Vitals:   01/22/19 1414 01/22/19 1415  BP:  111/61  Pulse:  65  Temp:  98.1 F (36.7 C)  TempSrc:  Oral  SpO2:  100%  Weight: 147 lb 14.4 oz (67.1 kg)   Height: 5\' 8"  (1.727 m)    Physical Exam  Constitutional: He is oriented to person, place, and time and well-developed, well-nourished, and in no distress.  Cardiovascular: Normal rate, regular rhythm and normal heart sounds.  No murmur heard. Pulmonary/Chest: Effort normal and breath sounds normal. No respiratory distress. He has no wheezes.  Abdominal: Soft. Bowel sounds are normal. He exhibits no distension. There is  no abdominal tenderness.  Musculoskeletal:        General: No tenderness or edema.  Neurological: He is alert and oriented to person, place, and time.  UE strength 5/5  Skin: Skin is warm and dry.  Psychiatric: Mood, memory, affect and judgment normal.    Assessment & Plan:   See Encounters Tab for problem based charting.  Patient discussed with Dr. Rebeca Alert

## 2019-01-22 NOTE — Assessment & Plan Note (Addendum)
Patient states that he has been unable to follow-up with rheumatology.  However he has an appointment later this month.  He states he is continuing to have joint stiffness mostly in his hands with decreased grip strength.  He has continued to use Biofreeze and heat therapy to manage his pain.  He has been out of his pain medication since we last prescribed 1 month supply. Discussed a short term pain medication supply until he follows with rhuem later this month.  Plan: - Patient has a rheumatology appointment later this month - Given a one week supply of norco 10-325 mg

## 2019-01-23 ENCOUNTER — Telehealth: Payer: Self-pay | Admitting: Licensed Clinical Social Worker

## 2019-01-23 DIAGNOSIS — F419 Anxiety disorder, unspecified: Secondary | ICD-10-CM | POA: Insufficient documentation

## 2019-01-23 NOTE — Assessment & Plan Note (Signed)
Patient states he was unable to follow up with general surgery after our last visit. He states he is still having pain and drainage from his anal fistula. States the antibiotics helped at first but then his symptoms returned. He was informed that this won't improve on its own and he will need to follow up with general surgery.   Plan: - sent in another referral to general surgery

## 2019-01-23 NOTE — Telephone Encounter (Signed)
Patient was contacted due to a recent referral. Patient was scheduled for 01/30/2019.

## 2019-01-23 NOTE — Assessment & Plan Note (Signed)
Patient states he has been feeling more stressed and anxious lately. He states things have been hard with his move and transition from [place] to [place]. His PHQ-9 was 0. He has not ever been on any anti-depressant therapy in the past but is amenable to a behavioral health referral at this time.   Plan:  - Referral to behavioral health to meet with Ms. Dessie Coma

## 2019-01-23 NOTE — Assessment & Plan Note (Signed)
Patient is to follow with rheumatology end of this month. Given a one week supply of norco to help him with this transition period.

## 2019-01-24 NOTE — Progress Notes (Signed)
Internal Medicine Clinic Attending  Case discussed with Dr. Laural Golden at the time of the visit.  We reviewed the resident's history and exam and pertinent patient test results.  I agree with the assessment, diagnosis, and plan of care documented in the resident's note.  Lenice Pressman, M.D., Ph.D.

## 2019-01-27 NOTE — Addendum Note (Signed)
Addended by: Hulan Fray on: 01/27/2019 02:54 PM   Modules accepted: Orders

## 2019-01-29 ENCOUNTER — Telehealth: Payer: Self-pay | Admitting: Licensed Clinical Social Worker

## 2019-01-29 NOTE — Telephone Encounter (Signed)
Patient was contacted to remind him of his appointment tomorrow, and inform him the appointment will be completed via the telephone.  Patient consented and confirmed the telephone appointment for tomorrow.

## 2019-01-30 ENCOUNTER — Telehealth: Payer: Self-pay | Admitting: Licensed Clinical Social Worker

## 2019-01-30 ENCOUNTER — Ambulatory Visit: Payer: Self-pay | Admitting: Licensed Clinical Social Worker

## 2019-01-30 NOTE — Telephone Encounter (Signed)
Patient was contacted due to a scheduled appointment. Patient declined having a telephone session, and requested to wait until he could be seen in the office face-to-face.

## 2019-02-04 ENCOUNTER — Encounter (HOSPITAL_COMMUNITY): Payer: Self-pay | Admitting: Emergency Medicine

## 2019-02-04 ENCOUNTER — Emergency Department (HOSPITAL_COMMUNITY)
Admission: EM | Admit: 2019-02-04 | Discharge: 2019-02-04 | Disposition: A | Discharge status: Home / Self Care | Payer: Medicare Other | Attending: Emergency Medicine | Admitting: Emergency Medicine

## 2019-02-04 ENCOUNTER — Emergency Department (HOSPITAL_COMMUNITY): Payer: Medicare Other

## 2019-02-04 DIAGNOSIS — J449 Chronic obstructive pulmonary disease, unspecified: Secondary | ICD-10-CM | POA: Insufficient documentation

## 2019-02-04 DIAGNOSIS — K603 Anal fistula: Secondary | ICD-10-CM | POA: Insufficient documentation

## 2019-02-04 DIAGNOSIS — I2510 Atherosclerotic heart disease of native coronary artery without angina pectoris: Secondary | ICD-10-CM | POA: Diagnosis not present

## 2019-02-04 DIAGNOSIS — M069 Rheumatoid arthritis, unspecified: Secondary | ICD-10-CM | POA: Diagnosis not present

## 2019-02-04 DIAGNOSIS — K6289 Other specified diseases of anus and rectum: Secondary | ICD-10-CM | POA: Insufficient documentation

## 2019-02-04 DIAGNOSIS — B20 Human immunodeficiency virus [HIV] disease: Secondary | ICD-10-CM | POA: Diagnosis not present

## 2019-02-04 DIAGNOSIS — K625 Hemorrhage of anus and rectum: Secondary | ICD-10-CM | POA: Diagnosis present

## 2019-02-04 DIAGNOSIS — F17210 Nicotine dependence, cigarettes, uncomplicated: Secondary | ICD-10-CM | POA: Diagnosis not present

## 2019-02-04 DIAGNOSIS — Z79899 Other long term (current) drug therapy: Secondary | ICD-10-CM | POA: Insufficient documentation

## 2019-02-04 DIAGNOSIS — K921 Melena: Secondary | ICD-10-CM | POA: Diagnosis not present

## 2019-02-04 DIAGNOSIS — K5120 Ulcerative (chronic) proctitis without complications: Secondary | ICD-10-CM | POA: Diagnosis not present

## 2019-02-04 LAB — URINALYSIS, ROUTINE W REFLEX MICROSCOPIC
Bacteria, UA: NONE SEEN
Bilirubin Urine: NEGATIVE
Glucose, UA: NEGATIVE mg/dL
Ketones, ur: NEGATIVE mg/dL
Leukocytes,Ua: NEGATIVE
Nitrite: NEGATIVE
Protein, ur: NEGATIVE mg/dL
Specific Gravity, Urine: 1.008 (ref 1.005–1.030)
pH: 6 (ref 5.0–8.0)

## 2019-02-04 LAB — BASIC METABOLIC PANEL
Anion gap: 10 (ref 5–15)
BUN: 9 mg/dL (ref 6–20)
CO2: 26 mmol/L (ref 22–32)
Calcium: 9.3 mg/dL (ref 8.9–10.3)
Chloride: 101 mmol/L (ref 98–111)
Creatinine, Ser: 1.1 mg/dL (ref 0.61–1.24)
GFR calc Af Amer: >60 mL/min (ref >60)
GFR calc non Af Amer: >60 mL/min (ref >60)
Glucose, Bld: 120 mg/dL — ABNORMAL HIGH (ref 70–99)
Potassium: 3.5 mmol/L (ref 3.5–5.1)
Sodium: 137 mmol/L (ref 135–145)

## 2019-02-04 LAB — CBC WITH DIFFERENTIAL/PLATELET
Abs Immature Granulocytes: 0.05 10*3/uL (ref 0.00–0.07)
Basophils Absolute: 0 10*3/uL (ref 0.0–0.1)
Basophils Relative: 0 %
Eosinophils Absolute: 0.1 10*3/uL (ref 0.0–0.5)
Eosinophils Relative: 1 %
HCT: 47.8 % (ref 39.0–52.0)
Hemoglobin: 15.3 g/dL (ref 13.0–17.0)
Immature Granulocytes: 0 %
Lymphocytes Relative: 27 %
Lymphs Abs: 3.2 10*3/uL (ref 0.7–4.0)
MCH: 30.5 pg (ref 26.0–34.0)
MCHC: 32 g/dL (ref 30.0–36.0)
MCV: 95.2 fL (ref 80.0–100.0)
Monocytes Absolute: 1.4 10*3/uL — ABNORMAL HIGH (ref 0.1–1.0)
Monocytes Relative: 12 %
Neutro Abs: 7.1 10*3/uL (ref 1.7–7.7)
Neutrophils Relative %: 60 %
Platelets: 349 10*3/uL (ref 150–400)
RBC: 5.02 MIL/uL (ref 4.22–5.81)
RDW: 13.4 % (ref 11.5–15.5)
WBC: 11.9 10*3/uL — ABNORMAL HIGH (ref 4.0–10.5)
nRBC: 0 % (ref 0.0–0.2)

## 2019-02-04 MED ORDER — SODIUM CHLORIDE 0.9 % IV BOLUS
1000.0000 mL | Freq: Once | INTRAVENOUS | Status: AC
  Administered 2019-02-04: 1000 mL via INTRAVENOUS

## 2019-02-04 MED ORDER — CIPROFLOXACIN HCL 500 MG PO TABS: 500.0000 mg | ORAL_TABLET | Freq: Two times a day (BID) | ORAL | 0 refills | Status: DC

## 2019-02-04 MED ORDER — HYDROMORPHONE HCL 1 MG/ML IJ SOLN
0.5000 mg | Freq: Once | INTRAMUSCULAR | Status: AC
  Administered 2019-02-04: 0.5 mg via INTRAVENOUS
  Filled 2019-02-04: qty 1

## 2019-02-04 MED ORDER — METRONIDAZOLE 500 MG PO TABS: 500.0000 mg | ORAL_TABLET | Freq: Two times a day (BID) | ORAL | 0 refills | Status: DC

## 2019-02-04 MED ORDER — IOHEXOL 300 MG/ML  SOLN
100.0000 mL | Freq: Once | INTRAMUSCULAR | Status: AC | PRN
  Administered 2019-02-04: 100 mL via INTRAVENOUS

## 2019-02-04 MED ORDER — HYDROMORPHONE HCL 1 MG/ML IJ SOLN
1.0000 mg | Freq: Once | INTRAMUSCULAR | Status: AC
  Administered 2019-02-04: 1 mg via INTRAVENOUS
  Filled 2019-02-04: qty 1

## 2019-02-04 MED ORDER — ONDANSETRON HCL 4 MG/2ML IJ SOLN
4.0000 mg | Freq: Once | INTRAMUSCULAR | Status: AC
  Administered 2019-02-04: 4 mg via INTRAVENOUS
  Filled 2019-02-04: qty 2

## 2019-02-04 NOTE — ED Provider Notes (Signed)
[facility] EMERGENCY DEPARTMENT Provider Note   CSN: [id number] Arrival date & time: 02/04/19  1304    History   Chief Complaint Chief Complaint  Patient presents with  . Rectal Bleeding    HPI Jonathan Rangel is a 43 y.o. male who  has a past medical history of Anal fistula, Bell's palsy, Benign neoplasm of meninges , CAD , Chronic pain, COPD,Headache, and HIV (well controlled).  Patient presents for worsening pain and swelling around his rectum and perineum.  History is gathered from the patient and review of his medical records.  Patient's last CD4 count was 1100 and he had an undetectable viral load.  He is compliant with his Biktarvy.  Takes Norco for his rheumatoid arthritis.  He is currently in the process of trying to follow-up with rheumatology.  The patient also had an appointment with [facility] surgery which he was unable to keep because they are currently not seeing elective surgery candidates at the office.  The patient states that over the past several days he has had worsening severe pain around his anus and inside his rectum.  He feels swelling in the perineal medial region and feels like it is difficult to urinate over the past few days.  He denies dysuria, hematuria flank pain or fevers.  He states that last night when he tried to make a bowel movement he felt something prolapse out of his rectum that was both bloody and filled with purulent discharge.  He was able to reduce the mass.  He states that his pain has become severe.  He states that he has been unable to fill his pain medications as he has been self isolating due to the current coronavirus.  outbreak.     HPI  Past Medical History:  Diagnosis Date  . Anal fistula   . Bell's palsy   . Benign neoplasm of meninges ([place])    gamma knife 05/28/15  . CAD (coronary artery disease)   . Chronic pain   . COPD (chronic obstructive pulmonary disease) ([place])   . Headache   . HIV (human  immunodeficiency virus infection) ([place]) 12/27/2002    Patient Active Problem List   Diagnosis Date Noted  . Anxiety 01/23/2019  . Anal fistula 10/22/2018  . Healthcare maintenance 07/30/2018  . Amphetamine and psychostimulant-induced psychosis with delusions ([place]) 05/21/2018  . Substance induced mood disorder ([place]) 01/24/2018  . Abnormal liver function 11/23/2017  . Chronic pain   . HIV (human immunodeficiency virus infection) ([place]) 11/20/2017  . Rheumatoid arthritis ([place]) 11/20/2017  . Fibromyalgia 11/20/2017  . History of syphilis 01/14/2015    Past Surgical History:  Procedure Laterality Date  . CARPAL TUNNEL RELEASE    . CHOLECYSTECTOMY    . COLONOSCOPY  2015   patient reported normal   . Stereotactic destruction of lesion using gamma radiation  05/28/2015   right temporal lobe   . TONSILLECTOMY          Home Medications    Prior to Admission medications   Medication Sig Start Date End Date Taking? Authorizing Provider  bictegravir-emtricitabine-tenofovir AF (BIKTARVY) 50-200-25 MG TABS tablet Take 1 tablet by mouth daily. Try to take at the same time each day with or without food. Patient taking differently: Take 1 tablet by mouth daily.  01/02/19  Yes [place] Callas, NP  Cetirizine HCl (ZYRTEC ALLERGY) 10 MG CAPS Take 1 capsule (10 mg total) by mouth daily. Patient taking differently: Take 10 mg by mouth as  needed.  08/24/18  Yes Tacy Learn, PA-C  FLUoxetine (PROZAC) 40 MG capsule Take 1 capsule (40 mg total) by mouth daily. 07/29/18  Yes [place] Callas, NP  fluticasone (FLONASE) 50 MCG/ACT nasal spray Place 1 spray into both nostrils daily. Patient taking differently: Place 1 spray into both nostrils as needed.  08/24/18  Yes Tacy Learn, PA-C  gabapentin (NEURONTIN) 400 MG capsule TAKE 1 CAPSULE (400 MG TOTAL) BY MOUTH 3 (THREE) TIMES DAILY. 01/06/19  Yes [place] Callas, NP  ibuprofen (ADVIL,MOTRIN) 200 MG tablet Take 600 mg by mouth every 6 (six)  hours as needed for moderate pain.   Yes [provider]  traZODone (DESYREL) 100 MG tablet Take 1 tablet (100 mg total) by mouth at bedtime. 01/22/19  Yes Rehman, Areeg N, DO  VENTOLIN HFA 108 (90 Base) MCG/ACT inhaler INHALE 1-2 PUFFS INTO THE LUNGS EVERY 6 (SIX) HOURS AS NEEDED FOR WHEEZING OR SHORTNESS OF BREATH. Patient taking differently: Inhale 1-2 puffs into the lungs every 6 (six) hours as needed for wheezing.  12/02/18  Yes [place] Callas, NP  azithromycin (ZITHROMAX) 250 MG tablet Take 1 tablet (250 mg total) by mouth daily. Take first 2 tablets together, then 1 every day until finished. Patient not taking: Reported on 02/04/2019 08/24/18   Tacy Learn, PA-C    Family History Family History  Problem Relation Age of Onset  . Breast cancer Maternal Grandmother   . Cancer Maternal Aunt     Social History Social History   Tobacco Use  . Smoking status: Current Every Day Smoker    Types: Cigarettes  . Smokeless tobacco: Never Used  . Tobacco comment: 1pk/4-5days; has info on quitting  Substance Use Topics  . Alcohol use: Yes  . Drug use: No     Allergies   Keflex [cephalexin]   Review of Systems Review of Systems  Ten systems reviewed and are negative for acute change, except as noted in the HPI.   Physical Exam Updated Vital Signs BP 132/73 (BP Location: Right Arm)   Pulse 89   Temp 98.9 F (37.2 C) (Oral)   Resp 20   SpO2 99%   Physical Exam Vitals signs and nursing note reviewed. Exam conducted with a chaperone present.  Constitutional:      General: He is not in acute distress.    Appearance: He is well-developed. He is not diaphoretic.  HENT:     Head: Normocephalic and atraumatic.  Eyes:     General: No scleral icterus.    Conjunctiva/sclera: Conjunctivae normal.  Neck:     Musculoskeletal: Normal range of motion and neck supple.  Cardiovascular:     Rate and Rhythm: Normal rate and regular rhythm.     Heart sounds: Normal heart  sounds.  Pulmonary:     Effort: Pulmonary effort is normal. No respiratory distress.     Breath sounds: Normal breath sounds.  Abdominal:     Palpations: Abdomen is soft.     Tenderness: There is no abdominal tenderness.  Genitourinary:    Rectum: Tenderness present. No anal fissure.       Comments: Unable to tolerate Digital rectal exam. Fistula with purulent drainage present at 4:00. TTP. Small tender lesion with mild denuding at 1:00. No fissures or masses. Scarring around the perianal region.  Skin:    General: Skin is warm and dry.  Neurological:     Mental Status: He is alert.  Psychiatric:  Behavior: Behavior normal.      ED Treatments / Results  Labs (all labs ordered are listed, but only abnormal results are displayed) Labs Reviewed  CBC WITH DIFFERENTIAL/PLATELET - Abnormal; Notable for the following components:      Result Value   WBC 11.9 (*)    Monocytes Absolute 1.4 (*)    All other components within normal limits  BASIC METABOLIC PANEL - Abnormal; Notable for the following components:   Glucose, Bld 120 (*)    All other components within normal limits  URINALYSIS, ROUTINE W REFLEX MICROSCOPIC - Abnormal; Notable for the following components:   Hgb urine dipstick SMALL (*)    All other components within normal limits    EKG None  Radiology No results found.  Procedures Procedures (including critical care time)  Medications Ordered in ED Medications  sodium chloride 0.9 % bolus 1,000 mL (1,000 mLs Intravenous New Bag/Given 02/04/19 1358)  HYDROmorphone (DILAUDID) injection 0.5 mg (0.5 mg Intravenous Given 02/04/19 1349)  ondansetron (ZOFRAN) injection 4 mg (4 mg Intravenous Given 02/04/19 1347)  iohexol (OMNIPAQUE) 300 MG/ML solution 100 mL (100 mLs Intravenous Contrast Given 02/04/19 1458)     Initial Impression / Assessment and Plan / ED Course  I have reviewed the triage vital signs and the nursing notes.  Pertinent labs & imaging  results that were available during my care of the patient were reviewed by me and considered in my medical decision making (see chart for details).        Patient CT scan is pending.  White blood cell count is mildly elevated.  I have given signout to [place]. Plan is to follow-up on the CT scan.  Consult as necessary.  If patient is appropriate for discharge she has an active prescription for pain medication.  I have reviewed the [facility] system and he has not had a pain medication filled since 10/30/2018. Patient can be discharged with Cipro and Flagyl for treatment of infection and follow-up with PCP for further pain control.  Patient otherwise appears stable throughout his visit.  Final Clinical Impressions(s) / ED Diagnoses   Final diagnoses:  None    ED Discharge Orders    None       Margarita Mail, PA-C 02/04/19 1520    Carmin Muskrat, MD 02/05/19 2016

## 2019-02-04 NOTE — ED Triage Notes (Signed)
Pt here with c/o rectal bleeding for approx 4 days . Pt had a appointment with surgery but his appointment his appointment was cancelled

## 2019-02-04 NOTE — Discharge Instructions (Signed)
You will need to follow-up with your doctor and general surgery.  Return here as needed.  The CT scan does not show any abscesses at this time.  You do have generalized inflammation around the rectum.  This will be treated as an infection.  You do have the fistula that is there presently.

## 2019-02-05 ENCOUNTER — Other Ambulatory Visit: Payer: Self-pay | Admitting: Internal Medicine

## 2019-03-04 ENCOUNTER — Telehealth: Payer: Self-pay | Admitting: *Deleted

## 2019-03-04 NOTE — Telephone Encounter (Signed)
LVM FOR PATIENT REGARDING HIS REFERRAL WITH [facility]/ IF HE IS STILL WANTING TO BE SEEN, ALL HE NEEDS TO DO IS CALL THE OFFICE AT [phone].

## 2019-03-04 NOTE — Addendum Note (Signed)
Addended by: Hulan Fray on: 03/04/2019 01:51 PM   Modules accepted: Orders

## 2019-03-07 ENCOUNTER — Other Ambulatory Visit: Payer: Self-pay | Admitting: Infectious Diseases

## 2019-03-12 ENCOUNTER — Encounter: Payer: Self-pay | Admitting: *Deleted

## 2019-03-19 ENCOUNTER — Other Ambulatory Visit: Payer: Self-pay | Admitting: Infectious Diseases

## 2019-03-21 ENCOUNTER — Other Ambulatory Visit: Payer: Self-pay

## 2019-03-21 DIAGNOSIS — Z21 Asymptomatic human immunodeficiency virus [HIV] infection status: Secondary | ICD-10-CM

## 2019-03-21 MED ORDER — BICTEGRAVIR-EMTRICITAB-TENOFOV 50-200-25 MG PO TABS: 1.0000 | ORAL_TABLET | Freq: Every day | ORAL | 0 refills | Status: DC

## 2019-03-26 ENCOUNTER — Ambulatory Visit (INDEPENDENT_AMBULATORY_CARE_PROVIDER_SITE_OTHER): Payer: Medicare Other | Admitting: Internal Medicine

## 2019-03-26 ENCOUNTER — Telehealth: Payer: Self-pay | Admitting: Internal Medicine

## 2019-03-26 ENCOUNTER — Other Ambulatory Visit: Payer: Self-pay

## 2019-03-26 ENCOUNTER — Telehealth: Payer: Self-pay | Admitting: Licensed Clinical Social Worker

## 2019-03-26 DIAGNOSIS — F17210 Nicotine dependence, cigarettes, uncomplicated: Secondary | ICD-10-CM

## 2019-03-26 DIAGNOSIS — K5900 Constipation, unspecified: Secondary | ICD-10-CM | POA: Diagnosis not present

## 2019-03-26 DIAGNOSIS — K603 Anal fistula: Secondary | ICD-10-CM | POA: Diagnosis not present

## 2019-03-26 DIAGNOSIS — R300 Dysuria: Secondary | ICD-10-CM | POA: Diagnosis not present

## 2019-03-26 MED ORDER — HYDROCODONE-ACETAMINOPHEN 7.5-325 MG PO TABS: 1.0000 | ORAL_TABLET | Freq: Four times a day (QID) | ORAL | 0 refills | Status: AC | PRN

## 2019-03-26 NOTE — Assessment & Plan Note (Signed)
Patient states he has continued to have rectal pain and swelling.  He states he is mostly having trouble defecating.  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  He was discharged with Cipro and metronidazole.  He states he completed this course but does not feel that his symptoms have improved.  He was able to get in touch with [facility] [place] [facility] this week and states that the scheduling office is going to get back to him.  We discussed that this will be the best treatment option for him.  In the meantime we will give him a short-term course of hydrocodone but encouraged a strong bowel regimen to prevent constipation.  Discussed using MiraLAX and or Senokot.  Patient states that MiraLAX does not work for him but he will try Senokot and improving his diet with fiber.  At this time there are no signs of systemic infection so we will not continue antibiotics.  He does note some left groin swelling and a knot left to his navel.  He describes pain from his waist all the way down to his knee.  Patient also states that he had a colonoscopy many years ago however due to poor bowel prep he was unable to have the procedure done.  He has never been diagnosed with Crohn's or ulcerative colitis.  Given his recurring anal fistula and proctitis will refer to GI for possible work-up of Crohn's or ulcerative colitis.  He may need a colonoscopy.  Plan: - Referral to GI - Short-term course of hydrocodone on with bowel regimen using MiraLAX and/or Senokot - Patient to follow-up with [facility] for evaluation

## 2019-03-26 NOTE — Telephone Encounter (Signed)
Patient was contacted to offer an in-person session in June. The patient will be coming into the office on 04/22/19 @ 10:00.

## 2019-03-26 NOTE — Progress Notes (Signed)
  [facility] Internal [facility] Residency Telephone Encounter Continuity Care Appointment  HPI:   This telephone encounter was created for Mr. Eryk Beavers on 03/26/2019 for the following purpose/cc anal fistula/rectal pain.   Past Medical History:  Past Medical History:  Diagnosis Date  . Anal fistula   . Bell's palsy   . Benign neoplasm of meninges ([place])    gamma knife 05/28/15  . CAD (coronary artery disease)   . Chronic pain   . COPD (chronic obstructive pulmonary disease) ([place])   . Headache   . HIV (human immunodeficiency virus infection) ([place]) 12/27/2002      ROS:  Review of Systems  Constitutional: Negative for chills, diaphoresis, fever and malaise/fatigue.  Respiratory: Negative for cough and shortness of breath.   Cardiovascular: Negative for chest pain and leg swelling.  Gastrointestinal: Positive for constipation. Negative for abdominal pain, blood in stool, diarrhea, nausea and vomiting.  Genitourinary: Positive for dysuria. Negative for frequency, hematuria and urgency.  Musculoskeletal: Negative for back pain, joint pain, myalgias and neck pain.     Assessment / Plan / Recommendations:   Please see A&P under problem oriented charting for assessment of the patient's acute and chronic medical conditions.   As always, pt is advised that if symptoms worsen or new symptoms arise, they should go to an urgent care facility or to to ER for further evaluation.   Consent and Medical Decision Making:   Patient discussed with Dr. Daryll Drown  This is a telephone encounter between [place] Craze and [place] on 03/26/2019 for rectal pain/anal fistula. The visit was conducted with the patient located at home and Areeg N Rehman at [facility]. The patient's identity was confirmed using their DOB and current address. The patient has consented to being evaluated through a telephone encounter and understands the associated risks (an examination cannot be done and the patient may need to come  in for an appointment) / benefits (allows the patient to remain at home, decreasing exposure to coronavirus). I personally spent 19 minutes on medical discussion.

## 2019-03-27 ENCOUNTER — Encounter: Payer: Self-pay | Admitting: *Deleted

## 2019-04-03 NOTE — Addendum Note (Signed)
Addended by: Gilles Chiquito B on: 04/03/2019 04:06 PM   Modules accepted: Level of Service

## 2019-04-03 NOTE — Progress Notes (Signed)
Internal Medicine Clinic Attending  Case discussed with Dr. Laural Golden soon after the resident saw the patient.  We reviewed the resident's history, telephone conversation and pertinent patient test results.  I agree with the assessment, diagnosis, and plan of care documented in the resident's note.

## 2019-04-09 ENCOUNTER — Telehealth: Payer: Self-pay | Admitting: Infectious Diseases

## 2019-04-09 NOTE — Telephone Encounter (Signed)
COVID-19 Pre-Screening Questions: ° °Do you currently have a fever (>100 °F), chills or unexplained body aches? No  ° °Are you currently experiencing new cough, shortness of breath, sore throat, runny nose? No  °•  °Have you recently travelled outside the state of [place] in the last 14 days? No  °•  °1. Have you been in contact with someone that is currently pending confirmation of Covid19 testing or has been confirmed to have the Covid19 virus?  No  ° °

## 2019-04-10 ENCOUNTER — Telehealth: Payer: Self-pay | Admitting: Licensed Clinical Social Worker

## 2019-04-10 ENCOUNTER — Encounter: Payer: Self-pay | Admitting: Infectious Diseases

## 2019-04-10 ENCOUNTER — Ambulatory Visit (INDEPENDENT_AMBULATORY_CARE_PROVIDER_SITE_OTHER): Payer: Medicare Other | Admitting: Infectious Diseases

## 2019-04-10 ENCOUNTER — Other Ambulatory Visit: Payer: Self-pay

## 2019-04-10 DIAGNOSIS — Z21 Asymptomatic human immunodeficiency virus [HIV] infection status: Secondary | ICD-10-CM

## 2019-04-10 DIAGNOSIS — K603 Anal fistula: Secondary | ICD-10-CM

## 2019-04-10 DIAGNOSIS — Z0000 Encounter for general adult medical examination without abnormal findings: Secondary | ICD-10-CM

## 2019-04-10 DIAGNOSIS — Z113 Encounter for screening for infections with a predominantly sexual mode of transmission: Secondary | ICD-10-CM

## 2019-04-10 DIAGNOSIS — M797 Fibromyalgia: Secondary | ICD-10-CM

## 2019-04-10 MED ORDER — CIPROFLOXACIN HCL 500 MG PO TABS: 500.0000 mg | ORAL_TABLET | Freq: Two times a day (BID) | ORAL | 0 refills | Status: DC

## 2019-04-10 MED ORDER — METRONIDAZOLE 500 MG PO TABS: 500.0000 mg | ORAL_TABLET | Freq: Two times a day (BID) | ORAL | 0 refills | Status: DC

## 2019-04-10 MED ORDER — CETIRIZINE HCL 10 MG PO CAPS: 10.0000 mg | ORAL_CAPSULE | Freq: Every day | ORAL | 11 refills | Status: DC

## 2019-04-10 MED ORDER — BICTEGRAVIR-EMTRICITAB-TENOFOV 50-200-25 MG PO TABS: 1.0000 | ORAL_TABLET | Freq: Every day | ORAL | 5 refills | Status: DC

## 2019-04-10 MED ORDER — GABAPENTIN 400 MG PO CAPS: 400.0000 mg | ORAL_CAPSULE | Freq: Three times a day (TID) | ORAL | 2 refills | Status: DC

## 2019-04-10 NOTE — Assessment & Plan Note (Signed)
Jonathan Rangel continues to do well with his Biktarvy adherence.  He had lab work 3 months ago which again revealed that he was having undetectable viral load.  He can continue taking his Biktarvy, refills provided today. His vaccines are currently up-to-date. He can return to the office in 6 months with labs prior to his visit.  We will check routine STD tests with serum RPR and urine gonorrhea chlamydia.  Further testing to be determined on risk after discussion.

## 2019-04-10 NOTE — Telephone Encounter (Signed)
Patient was contacted to move his in person visit from 6/10 to 6/9.  Patient did not answer, and a voicemail was left for the patient to contact our office for the reschedule.

## 2019-04-10 NOTE — Assessment & Plan Note (Signed)
Up-to-date on recommended vaccines for people living with HIV

## 2019-04-10 NOTE — Progress Notes (Signed)
Name: Jonathan Rangel  Date of Birth: [birth year]  MRN: [id number]  PCP: Mike Craze, DO   Virtual Visit via Telephone Note  I connected with Jonathan Rangel on 04/10/19 at 11:00 AM EDT by telephone and verified that I am speaking with the correct person using two identifiers.   I discussed the limitations, risks, security and privacy concerns of performing an evaluation and management service by telephone and the availability of in person appointments. I also discussed with the patient that there may be a patient responsible charge related to this service. The patient expressed understanding and agreed to proceed.   SUBJECTIVE:  Brief Narrative:  Jonathan Rangel is a 43 y.o. man with HIV disease first diagnosed in 2012. He has been historically under excellent control. In care previously in [place]. HIV Risk: MSM. HIV OIs: none   Previous Regimens:   Atripla >> suppressed  Biktarvy 2018 >> suppressed   Patient Active Problem List   Diagnosis Date Noted  . Anxiety 01/23/2019  . Anal fistula 10/22/2018  . Healthcare maintenance 07/30/2018  . Amphetamine and psychostimulant-induced psychosis with delusions ([place]) 05/21/2018  . Substance induced mood disorder ([place]) 01/24/2018  . Abnormal liver function 11/23/2017  . Chronic pain   . HIV (human immunodeficiency virus infection) ([place]) 11/20/2017  . Rheumatoid arthritis ([place]) 11/20/2017  . Fibromyalgia 11/20/2017  . History of syphilis 01/14/2015    CC: Routine follow-up HIV care.  Reports ER visit 2 months ago requiring drainage of rectal abscess in the setting of anal fistula.  Continues to have drainage.     HPI: [name] has been doing well taking his Biktarvy.  He has not missed a single dose since our last in office visit.  No concerns with access or side effects to his medication.  Only significant change to his health since our last visit includes ER visit in late March for evaluation of proctitis anal fistula and abscess.  He has  been waiting to be scheduled for surgery through [facility] surgery team however due to COVID-19 outbreak elective surgeries have been postponed.  His pain worsened to a severe state in addition to swelling at the time of his ER visit.  He apparently expressed a lot of bloody and purulent discharge from the mass at home.  CT scan was completed indicating proctitis and perianal fistula without drainable abscess. He says that initially the antibiotic course helped with drainage and it resolved however over the last week or so he has noticed a resumption of purulent discharge mixed with blood.  He denies any fevers or chills.  His pain is not increased and he has no worsening swelling presently in the setting of his open wound.  He is requesting a refill on his antibiotic until he can have his surgery.  He needs refills of his gabapentin and would like to consider a higher dose.  Currently taking 400mg  TID.  He is working with his primary care provider to help find a rheumatologist locally to help with his fibromyalgia and rheumatoid arthritis.  Also needs a refill of his allergy medicine.  Review of Systems  Constitutional: Negative for chills, fever, malaise/fatigue and weight loss.  HENT: Negative for sore throat.        No dental problems  Respiratory: Negative for cough and sputum production.   Cardiovascular: Negative for chest pain and leg swelling.  Gastrointestinal: Negative for abdominal pain, diarrhea and vomiting.  Genitourinary: Negative for dysuria and flank pain.  Musculoskeletal: Negative for back  pain, joint pain, myalgias and neck pain.  Skin: Negative for rash.  Neurological: Negative for dizziness, tingling and headaches.  Psychiatric/Behavioral: Negative for depression and substance abuse. The patient is not nervous/anxious and does not have insomnia.     Past Medical History:  Diagnosis Date  . Anal fistula   . Bell's palsy   . Benign neoplasm of meninges ([place])     gamma knife 05/28/15  . CAD (coronary artery disease)   . Chronic pain   . COPD (chronic obstructive pulmonary disease) ([place])   . Headache   . HIV (human immunodeficiency virus infection) ([place]) 12/27/2002    Allergies  Allergen Reactions  . Keflex [Cephalexin] Palpitations    Social History   Tobacco Use  . Smoking status: Current Every Day Smoker    Types: Cigarettes  . Smokeless tobacco: Never Used  . Tobacco comment: 1pk/4-5days; has info on quitting  Substance Use Topics  . Alcohol use: Yes  . Drug use: No    Family History  Problem Relation Age of Onset  . Breast cancer Maternal Grandmother   . Cancer Maternal Aunt     Social History   Substance and Sexual Activity  Sexual Activity Never    Physical Exam and Objective Findings:  There were no vitals filed for this visit. There is no height or weight on file to calculate BMI.  Jonathan Rangel sounds to be in good health on the phone today.  I do not detect any shortness of breath during our conversation.  His thought content/mood/judgment all appear intact.  Lab Results Lab Results  Component Value Date   WBC 11.9 (H) 02/04/2019   HGB 15.3 02/04/2019   HCT 47.8 02/04/2019   MCV 95.2 02/04/2019   PLT 349 02/04/2019    Lab Results  Component Value Date   CREATININE 1.10 02/04/2019   BUN 9 02/04/2019   NA 137 02/04/2019   K 3.5 02/04/2019   CL 101 02/04/2019   CO2 26 02/04/2019    Lab Results  Component Value Date   ALT 22 01/07/2019   AST 38 01/07/2019   ALKPHOS 112 07/22/2018   BILITOT 0.8 01/07/2019    Lab Results  Component Value Date   CHOL 196 01/07/2019   HDL 50 01/07/2019   LDLCALC 129 (H) 01/07/2019   TRIG 74 01/07/2019   CHOLHDL 3.9 01/07/2019   HIV 1 RNA Quant (copies/mL)  Date Value  01/07/2019 <20 DETECTED (A)  07/29/2018 <20 NOT DETECTED  07/22/2018 <20 DETECTED (A)   CD4 T Cell Abs (/uL)  Date Value  01/07/2019 1,090  07/29/2018 990  07/22/2018 1,110   Lab Results  Component  Value Date   HAV REACTIVE (A) 11/20/2017   Lab Results  Component Value Date   HEPBSAG NON-REACTIVE 11/20/2017   HEPBSAB BORDERLINE (A) 11/20/2017   ASSESSEMTN & PLAN:  Problem List Items Addressed This Visit      Unprioritized   Anal fistula    At the time of his evaluation a few weeks ago with his primary care provider he did not have any signs of infection.  It sounds that he has had a return of purulent drainage that he intermittently needs to help express on his own.  I will refill his metronidazole and Cipro but encouraged him that this is a Band-Aid and he needs to get into see his surgery team.  He tells me that he is waiting call back from their scheduler.  GI referral is also pending.  Fibromyalgia    I told him I am not comfortable increasing his gabapentin any further.  I can provide refills for him today.  Working with primary care team to find and establish care with rheumatology locally.      Relevant Medications   gabapentin (NEURONTIN) 400 MG capsule   Healthcare maintenance    Up-to-date on recommended vaccines for people living with HIV      HIV (human immunodeficiency virus infection) ([place]) (Chronic)    Mayco continues to do well with his Biktarvy adherence.  He had lab work 3 months ago which again revealed that he was having undetectable viral load.  He can continue taking his Biktarvy, refills provided today. His vaccines are currently up-to-date. He can return to the office in 6 months with labs prior to his visit.  We will check routine STD tests with serum RPR and urine gonorrhea chlamydia.  Further testing to be determined on risk after discussion.      Relevant Medications   bictegravir-emtricitabine-tenofovir AF (BIKTARVY) 50-200-25 MG TABS tablet   metroNIDAZOLE (FLAGYL) 500 MG tablet   Other Relevant Orders   T-helper cell (CD4)- ([facility] clinic only)   HIV-1 RNA quant-no reflex-bld   CBC   Comprehensive metabolic panel   Urinalysis    Other  Visit Diagnoses    Routine screening for STI (sexually transmitted infection)    -  Primary   Relevant Orders   RPR   Urine cytology ancillary only      Follow Up Instructions: Return to clinic in 6 months.  Pick up prescription refills including antibiotics.  Schedule appointment with GI and surgery with central [place] surgery.   I discussed the assessment and treatment plan with the patient. The patient was provided an opportunity to ask questions and all were answered. The patient agreed with the plan and demonstrated an understanding of the instructions.   The patient was advised to call back or seek an in-person evaluation if the symptoms worsen or if the condition fails to improve as anticipated.  I provided 12 minutes of non-face-to-face time during this encounter.   Janene Madeira, MSN, NP-C [facility] [email or web address] Pager: [phone] Office: [place]: [phone]   04/10/2019  12:49 PM

## 2019-04-10 NOTE — Assessment & Plan Note (Signed)
At the time of his evaluation a few weeks ago with his primary care provider he did not have any signs of infection.  It sounds that he has had a return of purulent drainage that he intermittently needs to help express on his own.  I will refill his metronidazole and Cipro but encouraged him that this is a Band-Aid and he needs to get into see his surgery team.  He tells me that he is waiting call back from their scheduler.  GI referral is also pending.

## 2019-04-10 NOTE — Assessment & Plan Note (Signed)
I told him I am not comfortable increasing his gabapentin any further.  I can provide refills for him today.  Working with primary care team to find and establish care with rheumatology locally.

## 2019-04-22 ENCOUNTER — Telehealth: Payer: Self-pay | Admitting: Licensed Clinical Social Worker

## 2019-04-22 NOTE — Telephone Encounter (Signed)
Patient was contacted about his appointment scheduled for tomorrow. Patient requested his appointment be completed over the phone due to barriers to coming into the office.

## 2019-04-23 ENCOUNTER — Other Ambulatory Visit: Payer: Self-pay

## 2019-04-23 ENCOUNTER — Encounter: Payer: Self-pay | Admitting: Licensed Clinical Social Worker

## 2019-04-23 ENCOUNTER — Ambulatory Visit (INDEPENDENT_AMBULATORY_CARE_PROVIDER_SITE_OTHER): Payer: Medicare Other | Admitting: Licensed Clinical Social Worker

## 2019-04-23 DIAGNOSIS — F419 Anxiety disorder, unspecified: Secondary | ICD-10-CM

## 2019-04-23 NOTE — BH Specialist Note (Signed)
Integrated Behavioral Health Visit via Telemedicine (Telephone)  04/23/2019 Jonathan Rangel [id number]   Session Start time: 10:07  Session End time: 10:30 Total time: 23 minutes  Referring Provider: Dr. Laural Golden Type of Visit: Telephonic Patient location: [facility] Provider location: Remote All persons participating in visit: patient, [facility], and [facility] intern  Confirmed patient's address: Yes  Confirmed patient's phone number: Yes  Any changes to demographics: No   Discussed confidentiality: Yes    The following statements were read to the patient and/or legal guardian that are established with the [facility] Provider.  "The purpose of this phone visit is to provide behavioral health care while limiting exposure to the coronavirus (COVID19).  There is a possibility of technology failure and discussed alternative modes of communication if that failure occurs."  "By engaging in this telephone visit, you consent to the provision of healthcare.  Additionally, you authorize for your insurance to be billed for the services provided during this telephone visit."   Patient and/or legal guardian consented to telephone visit: Yes   PRESENTING CONCERNS: Patient and/or family reports the following symptoms/concerns: interpersonal relationship issues, loneliness, anxiety, and struggles with communication.  Duration of problem: several years ; Severity of problem: mild  STRENGTHS (Protective Factors/Coping Skills): Enjoys being around family  GOALS ADDRESSED: Patient will: 1.  Reduce symptoms of: agitation, anxiety, stress and interpersonal relationship issues.  2.  Increase knowledge and/or ability of: coping skills, healthy habits and stress reduction  3.  Demonstrate ability to: Increase healthy adjustment to current life circumstances and Increase adequate support systems for patient/family  INTERVENTIONS: Interventions utilized:  Motivational Interviewing, Behavioral Activation and  Supportive Counseling Standardized Assessments completed: assessed for SI, HI, and self-harm.  ASSESSMENT: Patient currently experiencing mild anxiety and interpersonal issues . 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 Patient feels he tries to remove himself from negative people, but feels he is a magnet for these type of relationsihps. Patient identified that he might be "too nice". Patient identified he feels he is used by others, and wants to change this dynamic in his relationships. Patient could benefit from learning how to set healthy boundaries and limits in relationships with others.   Patient may benefit from outpatient counseling services. Marland Kitchen  PLAN: 1. Follow up with behavioral health clinician on : tomorrow due to a short visit.   Dessie Coma, [facility], [place]

## 2019-04-24 ENCOUNTER — Ambulatory Visit: Payer: Medicare Other | Admitting: Licensed Clinical Social Worker

## 2019-04-24 ENCOUNTER — Telehealth: Payer: Self-pay | Admitting: Licensed Clinical Social Worker

## 2019-04-24 NOTE — Telephone Encounter (Signed)
Patient was contacted twice today for his scheduled appointment. Patient did not answer, and a voicemail was left for the patient to contact our office if he would like to reschedule this missed appointment.

## 2019-04-28 NOTE — Addendum Note (Signed)
Addended by: Hulan Fray on: 04/28/2019 07:02 PM   Modules accepted: Orders

## 2019-05-04 ENCOUNTER — Other Ambulatory Visit: Payer: Self-pay | Admitting: Infectious Diseases

## 2019-05-11 ENCOUNTER — Encounter: Payer: Self-pay | Admitting: *Deleted

## 2019-06-23 ENCOUNTER — Other Ambulatory Visit: Payer: Self-pay

## 2019-06-23 DIAGNOSIS — R0602 Shortness of breath: Secondary | ICD-10-CM

## 2019-06-23 MED ORDER — VENTOLIN HFA 108 (90 BASE) MCG/ACT IN AERS: INHALATION_SPRAY | RESPIRATORY_TRACT | 1 refills | Status: DC

## 2019-08-27 ENCOUNTER — Other Ambulatory Visit: Payer: Self-pay | Admitting: *Deleted

## 2019-08-27 MED ORDER — TRAZODONE HCL 100 MG PO TABS: ORAL_TABLET | ORAL | 2 refills | Status: DC

## 2019-09-29 ENCOUNTER — Other Ambulatory Visit: Payer: Self-pay | Admitting: Infectious Diseases

## 2019-09-29 ENCOUNTER — Other Ambulatory Visit: Payer: Self-pay

## 2019-09-29 ENCOUNTER — Other Ambulatory Visit: Payer: Medicare Other

## 2019-09-29 ENCOUNTER — Telehealth: Payer: Self-pay

## 2019-09-29 DIAGNOSIS — Z113 Encounter for screening for infections with a predominantly sexual mode of transmission: Secondary | ICD-10-CM

## 2019-09-29 DIAGNOSIS — Z21 Asymptomatic human immunodeficiency virus [HIV] infection status: Secondary | ICD-10-CM

## 2019-09-29 DIAGNOSIS — Z1389 Encounter for screening for other disorder: Secondary | ICD-10-CM

## 2019-09-29 NOTE — Telephone Encounter (Signed)
Patient walked into clinic with complaint of blood in urine with mild lower abdominal pain for one week.  Urine sample obtained.  Patient was advised we will call him results are obtained and if symptoms increase go to local urgent care for evaluation.   Laverle Patter ,RN

## 2019-09-30 ENCOUNTER — Telehealth: Payer: Self-pay

## 2019-09-30 LAB — URINALYSIS, ROUTINE W REFLEX MICROSCOPIC
Bacteria, UA: NONE SEEN /HPF
Bilirubin Urine: NEGATIVE
Glucose, UA: NEGATIVE
Hgb urine dipstick: NEGATIVE
Hyaline Cast: NONE SEEN /LPF
Ketones, ur: NEGATIVE
Leukocytes,Ua: NEGATIVE
Nitrite: NEGATIVE
Specific Gravity, Urine: 1.027 (ref 1.001–1.03)
pH: <=5 (ref 5.0–8.0)

## 2019-09-30 LAB — T-HELPER CELL (CD4) - (RCID CLINIC ONLY)
CD4 % Helper T Cell: 32 % — ABNORMAL LOW (ref 33–65)
CD4 T Cell Abs: 1275 /uL (ref 400–1790)

## 2019-09-30 NOTE — Telephone Encounter (Signed)
-----   Message from  Callas, NP sent at 09/30/2019 10:29 AM EST ----- It looks like Jonathan Rangel's urinalysis has no evidence of blood in the urine and no signs of infection.  His kidney function is slightly elevated but has been consistent with previous draws in the past and unlikely to be contributing to his abdominal pain. Would continue to follow for any new symptoms, stay well hydrated and if any changes should follow with primary care team or urgent care.

## 2019-09-30 NOTE — Telephone Encounter (Signed)
Relayed Janene Madeira, Np's message regarding lab results. Patient was able to take my call; did not have any questions regarding lab results. Patient understands to follow up with PCP or Urgent care if there are any changes in how he is feeling. Patient verbalized understanding. [place]

## 2019-10-08 LAB — CBC
HCT: 44.2 % (ref 38.5–50.0)
Hemoglobin: 14.6 g/dL (ref 13.2–17.1)
MCH: 30.8 pg (ref 27.0–33.0)
MCHC: 33 g/dL (ref 32.0–36.0)
MCV: 93.2 fL (ref 80.0–100.0)
MPV: 10.1 fL (ref 7.5–12.5)
Platelets: 341 10*3/uL (ref 140–400)
RBC: 4.74 10*6/uL (ref 4.20–5.80)
RDW: 13 % (ref 11.0–15.0)
WBC: 11.7 10*3/uL — ABNORMAL HIGH (ref 3.8–10.8)

## 2019-10-08 LAB — COMPREHENSIVE METABOLIC PANEL
AG Ratio: 1.6 (calc) (ref 1.0–2.5)
ALT: 9 U/L (ref 9–46)
AST: 18 U/L (ref 10–40)
Albumin: 4.5 g/dL (ref 3.6–5.1)
Alkaline phosphatase (APISO): 69 U/L (ref 36–130)
BUN/Creatinine Ratio: 10 (calc) (ref 6–22)
BUN: 14 mg/dL (ref 7–25)
CO2: 27 mmol/L (ref 20–32)
Calcium: 9.8 mg/dL (ref 8.6–10.3)
Chloride: 102 mmol/L (ref 98–110)
Creat: 1.37 mg/dL — ABNORMAL HIGH (ref 0.60–1.35)
Globulin: 2.9 g/dL (calc) (ref 1.9–3.7)
Glucose, Bld: 100 mg/dL — ABNORMAL HIGH (ref 65–99)
Potassium: 3.8 mmol/L (ref 3.5–5.3)
Sodium: 139 mmol/L (ref 135–146)
Total Bilirubin: 0.5 mg/dL (ref 0.2–1.2)
Total Protein: 7.4 g/dL (ref 6.1–8.1)

## 2019-10-08 LAB — HIV-1 RNA QUANT-NO REFLEX-BLD
HIV 1 RNA Quant: <20 copies/mL
HIV-1 RNA Quant, Log: <1.3 Log copies/mL

## 2019-10-08 LAB — FLUORESCENT TREPONEMAL AB(FTA)-IGG-BLD: Fluorescent Treponemal ABS: REACTIVE — AB

## 2019-10-08 LAB — RPR: RPR Ser Ql: REACTIVE — AB

## 2019-10-08 LAB — RPR TITER: RPR Titer: 1:4 {titer} — ABNORMAL HIGH

## 2019-10-15 ENCOUNTER — Encounter: Payer: Medicare Other | Admitting: Internal Medicine

## 2019-10-16 ENCOUNTER — Ambulatory Visit (INDEPENDENT_AMBULATORY_CARE_PROVIDER_SITE_OTHER): Payer: Medicare Other | Admitting: Internal Medicine

## 2019-10-16 VITALS — BP 137/98 | HR 84 | Wt 151.4 lb

## 2019-10-16 DIAGNOSIS — Z23 Encounter for immunization: Secondary | ICD-10-CM

## 2019-10-16 DIAGNOSIS — Z79899 Other long term (current) drug therapy: Secondary | ICD-10-CM

## 2019-10-16 DIAGNOSIS — R1030 Lower abdominal pain, unspecified: Secondary | ICD-10-CM

## 2019-10-16 DIAGNOSIS — R197 Diarrhea, unspecified: Secondary | ICD-10-CM

## 2019-10-16 DIAGNOSIS — R1032 Left lower quadrant pain: Secondary | ICD-10-CM

## 2019-10-16 DIAGNOSIS — F17210 Nicotine dependence, cigarettes, uncomplicated: Secondary | ICD-10-CM | POA: Diagnosis not present

## 2019-10-16 DIAGNOSIS — Z21 Asymptomatic human immunodeficiency virus [HIV] infection status: Secondary | ICD-10-CM

## 2019-10-16 DIAGNOSIS — G8929 Other chronic pain: Secondary | ICD-10-CM

## 2019-10-16 DIAGNOSIS — R109 Unspecified abdominal pain: Secondary | ICD-10-CM | POA: Insufficient documentation

## 2019-10-16 DIAGNOSIS — Z0000 Encounter for general adult medical examination without abnormal findings: Secondary | ICD-10-CM

## 2019-10-16 MED ORDER — CHOLESTYRAMINE 4 G PO PACK: 4.0000 g | PACK | Freq: Every day | ORAL | 2 refills | Status: DC

## 2019-10-16 NOTE — Assessment & Plan Note (Addendum)
Patient reports that he has been having abdominal pain for the past 2 years, it has been pretty stable.  It is located over the left lower abdominal area, feels like a sharp pain, no radiation, improved with bowel movements, ginger ale, and Imodium.  He also endorses chronic diarrhea, has up to 10-15 bowel movements in a day that are improves and watery.  He denies any hematochezia, melena, weight loss, fevers, chills, urinary symptoms, or other symptoms.  He reports that he has had a colonoscopy in the past and states that it was not able to be completed due to poor prep, has not seen any GI doctors recently.  On exam he has a small hypopigmented papule near the 7 o'clock position his rectal area, no hemorrhoids or other lesions noted.  POC FOBT was negative.  On chart review he does have a history of rectal bleeding and anal fistula, he had a CT pelvis on 02/04/2019 that showed rectal wall thickening, inflammation and adenopathy in the perirectal fat.  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 Patient was seen on 11/16 for symptoms of lower abdominal pain and "blood in his urine".  He had a urinalysis obtained that showed no evidence of blood or infection.  Differential for his chronic abdominal pain and diarrhea includes IBS, IBD, chronic infection, malabsorption, or microscopic colitis.  Patient needs to have a colonoscopy to further evaluate and will likely need a biopsy.  Patient already takes Imodium, will add cholestyramine for possible IBS symptoms.  -Continue Imodium 2 mg 45 minutes before meals -Start cholestyramine 4 mg daily -Referral for GI placed

## 2019-10-16 NOTE — Assessment & Plan Note (Signed)
Flu shot given today

## 2019-10-16 NOTE — Progress Notes (Signed)
CC: Abdominal pain  HPI:  [name] is a 43 y.o.  with a PMH listed below presenting for chronic abdominal pain and diarrhea.  Please see A&P for status of the patient's chronic medical conditions  Past Medical History:  Diagnosis Date  . Anal fistula   . Bell's palsy   . Benign neoplasm of meninges ([place])    gamma knife 05/28/15  . CAD (coronary artery disease)   . Chronic pain   . COPD (chronic obstructive pulmonary disease) ([place])   . Headache   . HIV (human immunodeficiency virus infection) ([place]) 12/27/2002   Review of Systems: Refer to history of present illness and assessment and plans for pertinent review of systems, all others reviewed and negative.  Physical Exam:  Vitals:   10/16/19 1001  BP: (!) 137/98  Pulse: 84  Weight: 151 lb 6.4 oz (68.7 kg)    Physical Exam  Constitutional: He is oriented to person, place, and time and well-developed, well-nourished, and in no distress.  Cardiovascular: Normal rate, regular rhythm and normal heart sounds.  Pulmonary/Chest: Effort normal and breath sounds normal. No respiratory distress.  Abdominal: Soft. Bowel sounds are normal. He exhibits no distension and no mass. There is abdominal tenderness (Mild TTP diffusely). There is no rebound and no guarding.  Genitourinary:    Rectum normal.     Genitourinary Comments: Small, hypopigmented papule on right rectal area   Neurological: He is alert and oriented to person, place, and time.  Skin: Skin is warm and dry.  Psychiatric: Mood and affect normal.    Social History   Socioeconomic History  . Marital status: Single    Spouse name: Not on file  . Number of children: Not on file  . Years of education: Not on file  . Highest education level: Not on file  Occupational History  . Not on file  Social Needs  . Financial resource strain: Not on file  . Food insecurity    Worry: Not on file    Inability: Not on file  . Transportation needs    Medical: Not on file     Non-medical: Not on file  Tobacco Use  . Smoking status: Current Every Day Smoker    Types: Cigarettes  . Smokeless tobacco: Never Used  . Tobacco comment: 1pk/4-5days; has info on quitting  Substance and Sexual Activity  . Alcohol use: Yes  . Drug use: No  . Sexual activity: Never  Lifestyle  . Physical activity    Days per week: Not on file    Minutes per session: Not on file  . Stress: Not on file  Relationships  . Social Herbalist on phone: Not on file    Gets together: Not on file    Attends religious service: Not on file    Active member of club or organization: Not on file    Attends meetings of clubs or organizations: Not on file    Relationship status: Not on file  . Intimate partner violence    Fear of current or ex partner: Not on file    Emotionally abused: Not on file    Physically abused: Not on file    Forced sexual activity: Not on file  Other Topics Concern  . Not on file  Social History Narrative  . Not on file    Family History  Problem Relation Age of Onset  . Breast cancer Maternal Grandmother   . Cancer Maternal Aunt  Assessment & Plan:   See Encounters Tab for problem based charting.  Patient discussed with Dr. Daryll Drown

## 2019-10-16 NOTE — Assessment & Plan Note (Signed)
Patient has a history of HIV that appears to be well controlled, currently on [place] and follows with our CAD for this.  He also has a history of syphilis and is currently being treated for this.  He denies any issues taking his medications.

## 2019-10-16 NOTE — Progress Notes (Signed)
Internal Medicine Clinic Attending  Case discussed with Dr. Krienke at the time of the visit.  We reviewed the resident's history and exam and pertinent patient test results.  I agree with the assessment, diagnosis, and plan of care documented in the resident's note.    

## 2019-10-16 NOTE — Patient Instructions (Signed)
Mr. Stran Becton,  It was a pleasure to see you today. Thank you for coming in.   Today we discussed your diarrhea and abdominal pain.  Please start taking the Imodium as follows: 2 mg 45 minutes before a meal Please start taking cholestyramine 4 mg daily I have sent in a referral for the Gastroenterologist  Please return to clinic in 6 months or sooner if needed.   Thank you again for coming in.   Asencion Noble.D.

## 2019-10-20 ENCOUNTER — Telehealth (INDEPENDENT_AMBULATORY_CARE_PROVIDER_SITE_OTHER): Payer: Medicare Other | Admitting: Infectious Diseases

## 2019-10-20 ENCOUNTER — Other Ambulatory Visit: Payer: Self-pay

## 2019-10-20 DIAGNOSIS — Z113 Encounter for screening for infections with a predominantly sexual mode of transmission: Secondary | ICD-10-CM | POA: Diagnosis not present

## 2019-10-20 DIAGNOSIS — Z21 Asymptomatic human immunodeficiency virus [HIV] infection status: Secondary | ICD-10-CM | POA: Diagnosis not present

## 2019-10-20 MED ORDER — BICTEGRAVIR-EMTRICITAB-TENOFOV 50-200-25 MG PO TABS: 1.0000 | ORAL_TABLET | Freq: Every day | ORAL | 11 refills | Status: DC

## 2019-10-20 NOTE — Progress Notes (Signed)
Name: Jonathan Rangel  Date of Birth: Jan 31, 1976  MRN: [id number]  PCP: Jonathan Rob, MD   Virtual Visit via MyChart Video  I connected with Jonathan Rangel on 10/20/19 at 10:00 AM EST by telephone and verified that I am speaking with the correct person using two identifiers.   I discussed the limitations, risks, security and privacy concerns of performing an evaluation and management service by telephone and the availability of in person appointments. I also discussed with the patient that there may be a patient responsible charge related to this service. The patient expressed understanding and agreed to proceed.  Patient Location: mother's apartment in [place], no other people present  Provider location: [facility]   SUBJECTIVE:  Brief Narrative:  Jonathan Rangel is a 43 y.o. man with HIV disease first diagnosed in 2012. He has been historically under excellent control. In care previously in [place]. HIV Risk: MSM. HIV OIs: none   Previous Regimens:   Atripla >> suppressed  Biktarvy 2018 >> suppressed   Patient Active Problem List   Diagnosis Date Noted  . Abdominal pain 10/16/2019  . Anxiety 01/23/2019  . Anal fistula 10/22/2018  . Healthcare maintenance 07/30/2018  . Amphetamine and psychostimulant-induced psychosis with delusions (Jonathan Rangel) 05/21/2018  . Substance induced mood disorder (Jonathan Rangel) 01/24/2018  . Abnormal liver function 11/23/2017  . Chronic pain   . HIV (human immunodeficiency virus infection) (Jonathan Rangel) 11/20/2017  . Rheumatoid arthritis (Jonathan Rangel) 11/20/2017  . Fibromyalgia 11/20/2017  . History of syphilis 01/14/2015    CC: Routine follow-up HIV care. Abdominal pain and diarrhea.      HPI: He had bone marrow removed from previous biopsy   He has a plan to follow up with Jonathan Rangel.   [name] has been taking his Biktarvy everyday without any missed doses. He reports no concern for side effects or access to this medication. He is currently caring for his elderly mother. No sick  contacts to his knowledge. Not presently sexually active.   His only concern is the ongoing left abdominal/side pain and diarrhea. He has 10-15 episodes a day and feels continually drained/dehydrated. Saw PCP recently and started on cholestyramine with a moderate improvement in pain but not resolution. He saw Jonathan Rangel team and they recommended ongoing monitoring on his Imodium and Cholestyramine for now; consideration for colonoscopy if refractory. No fevers/chills noted. He wonders if the pain is anyway related to previous bone marrow biopsy years ago because it is in a similar spot.     Review of Systems  Constitutional: Negative for chills, fever, malaise/fatigue and weight loss.  HENT: Negative for sore throat.        No dental problems  Respiratory: Negative for cough and sputum production.   Cardiovascular: Negative for chest pain and leg swelling.  Gastrointestinal: Negative for abdominal pain, diarrhea and vomiting.  Genitourinary: Negative for dysuria and flank pain.  Musculoskeletal: Negative for back pain, joint pain, myalgias and neck pain.  Skin: Negative for rash.  Neurological: Negative for dizziness, tingling and headaches.  Psychiatric/Behavioral: Negative for depression and substance abuse. The patient is not nervous/anxious and does not have insomnia.     Past Medical History:  Diagnosis Date  . Anal fistula   . Bell's palsy   . Benign neoplasm of meninges (Jonathan Rangel)    gamma knife 05/28/15  . CAD (coronary artery disease)   . Chronic pain   . COPD (chronic obstructive pulmonary disease) ([place])   . Headache   . HIV (human immunodeficiency virus infection) (  [place]) 12/27/2002    Allergies  Allergen Reactions  . Keflex [Cephalexin] Palpitations    Social History   Tobacco Use  . Smoking status: Current Every Day Smoker    Types: Cigarettes  . Smokeless tobacco: Never Used  . Tobacco comment: 1pk/4-5days; has info on quitting  Substance Use Topics  . Alcohol use:  Yes  . Drug use: No    Family History  Problem Relation Age of Onset  . Breast cancer Maternal Grandmother   . Cancer Maternal Aunt     Social History   Substance and Sexual Activity  Sexual Activity Never    Physical Exam and Objective Findings:  There were no vitals filed for this visit. There is no height or weight on file to calculate BMI.  Physical Exam Vitals signs reviewed.  Constitutional:      Appearance: Normal appearance. He is well-developed. He is not ill-appearing.  HENT:     Head: Normocephalic.     Mouth/Throat:     Mouth: Mucous membranes are moist.     Dentition: Normal dentition. No dental abscesses.     Pharynx: Oropharynx is clear.  Eyes:     General: No scleral icterus. Pulmonary:     Effort: Pulmonary effort is normal.  Abdominal:     Comments: Patient wearing heavy coat - difficult to see abdomen contouring.   Neurological:     Mental Status: He is alert and oriented to person, place, and time.  Psychiatric:        Thought Content: Thought content normal.        Judgment: Judgment normal.     Comments: In good spirits today      Lab Results Lab Results  Component Value Date   WBC 11.7 (H) 09/29/2019   HGB 14.6 09/29/2019   HCT 44.2 09/29/2019   MCV 93.2 09/29/2019   PLT 341 09/29/2019    Lab Results  Component Value Date   CREATININE 1.37 (H) 09/29/2019   BUN 14 09/29/2019   NA 139 09/29/2019   K 3.8 09/29/2019   CL 102 09/29/2019   CO2 27 09/29/2019    Lab Results  Component Value Date   ALT 9 09/29/2019   AST 18 09/29/2019   ALKPHOS 112 07/22/2018   BILITOT 0.5 09/29/2019    Lab Results  Component Value Date   CHOL 196 01/07/2019   HDL 50 01/07/2019   LDLCALC 129 (H) 01/07/2019   TRIG 74 01/07/2019   CHOLHDL 3.9 01/07/2019   HIV 1 RNA Quant (copies/mL)  Date Value  09/29/2019 <20 NOT DETECTED  01/07/2019 <20 DETECTED (A)  07/29/2018 <20 NOT DETECTED   CD4 T Cell Abs (/uL)  Date Value  09/29/2019 1,275   01/07/2019 1,090  07/29/2018 990   Lab Results  Component Value Date   HAV REACTIVE (A) 11/20/2017   Lab Results  Component Value Date   HEPBSAG NON-REACTIVE 11/20/2017   HEPBSAB BORDERLINE (A) 11/20/2017   ASSESSEMTN & PLAN:  Problem List Items Addressed This Visit    None      Follow Up Instructions: Return to clinic in 6 months.     I discussed the assessment and treatment plan with the patient. The patient was provided an opportunity to ask questions and all were answered. The patient agreed with the plan and demonstrated an understanding of the instructions.   The patient was advised to call back or seek an in-person evaluation if the symptoms worsen or if the condition fails to  improve as anticipated.  I provided 15 minutes of non-face-to-face time during this encounter.   Janene Madeira, MSN, NP-C [facility] Infectious [facility] [email or web address] Pager: [phone] Office: [place]: [phone]   10/20/2019  10:03 AM

## 2019-10-21 ENCOUNTER — Encounter: Payer: Self-pay | Admitting: Infectious Diseases

## 2019-10-21 NOTE — Patient Instructions (Addendum)
Please continue your Biktarvy every day.   Your goal should be to achieve a soft, formed, easy to pass BM once a day.   The more diarrhea you have the more fluids you will need to drink - the best to drink is water. Fluids that do not count include alcohol, caffeine-containing sodas, coffee.   I have you down for routine follow up with [place] again in 6 months where you can come to lab for follow up (including a urine sample) and a MyChart video visit again 2 weeks later. If you prefer face to face we can change your appointment to reflect this.    Prior to your MyChart Video Visit:   Please be sure to join your visit at least 10 minutes before your scheduled time.   Click the link below to test your video and microphone prior to your visit [email or web address]  When it is time for your visit click Begin Video Visit   Be sure to Select Allow for your device to access the Microphone and Camera for your visit.   You will then be connected, and your provider will be with you shortly.  **If you have any issues connecting, or need assistance please contact our [place] service desk [phone])**  **In order to ensure the best quality for your visit you will need to use either of the following Internet Browsers: [employer], or [employer]**

## 2019-11-24 ENCOUNTER — Encounter: Payer: Self-pay | Admitting: Internal Medicine

## 2019-12-09 ENCOUNTER — Telehealth: Payer: Self-pay | Admitting: Internal Medicine

## 2019-12-09 ENCOUNTER — Other Ambulatory Visit: Payer: Self-pay

## 2019-12-09 NOTE — Telephone Encounter (Signed)
Pt called to [facility] to make appt; he was told that they didn't have everything on his referral; pls contact pt [phone]

## 2019-12-10 ENCOUNTER — Other Ambulatory Visit: Payer: Self-pay

## 2019-12-10 DIAGNOSIS — R0602 Shortness of breath: Secondary | ICD-10-CM

## 2019-12-10 MED ORDER — GABAPENTIN 400 MG PO CAPS: 400.0000 mg | ORAL_CAPSULE | Freq: Three times a day (TID) | ORAL | 1 refills | Status: DC

## 2019-12-10 NOTE — Telephone Encounter (Signed)
Received request for refill for Gabapentin. Medication approved with 1 additional refill. Request for  Ventolin inhaler- refused. Please defer to PCP (note to pharmacy) Eugenia Mcalpine

## 2019-12-15 NOTE — Telephone Encounter (Signed)
Spoke with Howie Ill.  [facility] only rec'd Vitals signs no notes were Attached.  Will have Lela to Refax referral request to [phone] per [facility].

## 2019-12-16 ENCOUNTER — Telehealth: Payer: Self-pay

## 2019-12-16 NOTE — Telephone Encounter (Signed)
Patient called to ask for previous RPR results. Patient states he was contacted by [facility] as a contact. Advise patient that his previous titer is trending down which is a sign of successful treatment unless he has had any reinfection since last labs in 09/2019. Patient states he just wanted [facility] to know that [facility] may be reaching out from information. Eugenia Mcalpine

## 2019-12-31 ENCOUNTER — Telehealth: Payer: Self-pay | Admitting: *Deleted

## 2019-12-31 ENCOUNTER — Ambulatory Visit (INDEPENDENT_AMBULATORY_CARE_PROVIDER_SITE_OTHER): Payer: Medicare Other | Admitting: Internal Medicine

## 2019-12-31 ENCOUNTER — Other Ambulatory Visit: Payer: Self-pay

## 2019-12-31 ENCOUNTER — Encounter: Payer: Self-pay | Admitting: Internal Medicine

## 2019-12-31 VITALS — BP 135/65 | HR 70 | Temp 98.7°F | Ht 68.0 in | Wt 152.6 lb

## 2019-12-31 DIAGNOSIS — L089 Local infection of the skin and subcutaneous tissue, unspecified: Secondary | ICD-10-CM | POA: Insufficient documentation

## 2019-12-31 MED ORDER — DOXYCYCLINE HYCLATE 100 MG PO TABS: 100.0000 mg | ORAL_TABLET | Freq: Two times a day (BID) | ORAL | 0 refills | Status: AC

## 2019-12-31 NOTE — Progress Notes (Signed)
Internal Medicine Clinic Attending  Case discussed with Dr. Agyei at the time of the visit.  We reviewed the resident's history and exam and pertinent patient test results.  I agree with the assessment, diagnosis, and plan of care documented in the resident's note.    

## 2019-12-31 NOTE — Patient Instructions (Addendum)
[name],   Thanks for seeing Jonathan Rangel today. As we talked about, you have an infection in the soft tissue of your nose. I have prescribed antibiotics.   This should help with the infection. If symtoms does not improve, please come back to see Jonathan Rangel.  Take care! Dr. Eileen Stanford  Please call the internal medicine center clinic if you have any questions or concerns, we may be able to help and keep you from a long and expensive emergency room wait. Our clinic and after hours phone number is [phone], the best time to call is Monday through Friday 9 am to 4 pm but there is always someone available 24/7 if you have an emergency. If you need medication refills please notify your pharmacy one week in advance and they will send Jonathan Rangel a request.

## 2019-12-31 NOTE — Telephone Encounter (Signed)
Pt calls and states he had an infection in his tooth, upper R side. It got better and now his R nostril is swollen and infected. Its hard to breathe thru that nostril. ACC 1315

## 2019-12-31 NOTE — Progress Notes (Signed)
   CC: Right nostril pain  HPI:  Mr.Khoi Gordillo is a 44 y.o. pleasant African-American gentleman with medical history listed below presenting for evaluation of right nostril pain.  Please see problem based charting for further details.    Past Medical History:  Diagnosis Date  . Anal fistula   . Bell's palsy   . Benign neoplasm of meninges ([place])    gamma knife 05/28/15  . CAD (coronary artery disease)   . Chronic pain   . COPD (chronic obstructive pulmonary disease) ([place])   . Headache   . HIV (human immunodeficiency virus infection) ([place]) 12/27/2002   Review of Systems:  As per HPI  Physical Exam:  Vitals:   12/31/19 1325  Weight: 152 lb 9.6 oz (69.2 kg)  Height: 5\' 8"  (1.727 m)   Physical Exam  Constitutional: He is well-developed, well-nourished, and in no distress. No distress.  HENT:  Nose: Nose lacerations and sinus tenderness present. No rhinorrhea, septal deviation or nasal septal hematoma.  No foreign bodies. Right sinus exhibits frontal sinus tenderness. Left sinus exhibits frontal sinus tenderness.    Skin: He is not diaphoretic.      Assessment & Plan:   See Encounters Tab for problem based charting.  Patient discussed with Dr. Lynnae January

## 2019-12-31 NOTE — Telephone Encounter (Signed)
Agree, thanks

## 2019-12-31 NOTE — Assessment & Plan Note (Signed)
Soft tissue infection (right nostril): Jonathan Rangel reports that he was in his usual state of health until 2 days ago when he began experiencing upper respiratory viral symptoms such as nasal congestion and rhinorrhea.  He denies fevers, chills, cough or shortness of breath.  He states that he scratched the inner part of his right nostril and began experiencing exquisite pain as well as drainage.  On examination of his nose, he did have clear soft tissue swelling at the inner part of his right nostril medially with purulent drainage.  The area was tender to touch with noticeable erythema.  I did try my best to drain the purulent matter to the best of my ability.    A: Purulent soft tissue infection. Does not appear to be a complication of sinusitis as he clearly has inciting trauma to the area  Plan:  -Doxycycline 100mg  BID x 7days -RTC in 2 weeks  -Nasal irrigation as needed

## 2020-01-09 ENCOUNTER — Other Ambulatory Visit: Payer: Self-pay | Admitting: Gastroenterology

## 2020-01-09 DIAGNOSIS — R1084 Generalized abdominal pain: Secondary | ICD-10-CM | POA: Diagnosis not present

## 2020-01-09 DIAGNOSIS — R634 Abnormal weight loss: Secondary | ICD-10-CM | POA: Diagnosis not present

## 2020-01-09 DIAGNOSIS — R198 Other specified symptoms and signs involving the digestive system and abdomen: Secondary | ICD-10-CM | POA: Diagnosis not present

## 2020-01-15 DIAGNOSIS — R1084 Generalized abdominal pain: Secondary | ICD-10-CM | POA: Diagnosis not present

## 2020-01-20 ENCOUNTER — Ambulatory Visit
Admission: RE | Admit: 2020-01-20 | Discharge: 2020-01-20 | Disposition: A | Discharge status: Home / Self Care | Payer: Medicare HMO | Source: Ambulatory Visit | Attending: Gastroenterology | Admitting: Gastroenterology

## 2020-01-20 DIAGNOSIS — R1084 Generalized abdominal pain: Secondary | ICD-10-CM

## 2020-01-20 DIAGNOSIS — B20 Human immunodeficiency virus [HIV] disease: Secondary | ICD-10-CM | POA: Diagnosis not present

## 2020-01-20 DIAGNOSIS — R634 Abnormal weight loss: Secondary | ICD-10-CM

## 2020-01-20 MED ORDER — IOPAMIDOL (ISOVUE-300) INJECTION 61%
100.0000 mL | Freq: Once | INTRAVENOUS | Status: AC | PRN
  Administered 2020-01-20: 100 mL via INTRAVENOUS

## 2020-01-26 DIAGNOSIS — Z1159 Encounter for screening for other viral diseases: Secondary | ICD-10-CM | POA: Diagnosis not present

## 2020-01-29 DIAGNOSIS — R933 Abnormal findings on diagnostic imaging of other parts of digestive tract: Secondary | ICD-10-CM | POA: Diagnosis not present

## 2020-01-29 DIAGNOSIS — R1084 Generalized abdominal pain: Secondary | ICD-10-CM | POA: Diagnosis not present

## 2020-01-29 DIAGNOSIS — R6889 Other general symptoms and signs: Secondary | ICD-10-CM | POA: Diagnosis not present

## 2020-02-03 DIAGNOSIS — R933 Abnormal findings on diagnostic imaging of other parts of digestive tract: Secondary | ICD-10-CM | POA: Diagnosis not present

## 2020-02-03 DIAGNOSIS — R1084 Generalized abdominal pain: Secondary | ICD-10-CM | POA: Diagnosis not present

## 2020-02-09 ENCOUNTER — Encounter: Payer: Medicare Other | Admitting: Internal Medicine

## 2020-02-09 ENCOUNTER — Telehealth: Payer: Self-pay | Admitting: *Deleted

## 2020-02-09 NOTE — Progress Notes (Deleted)
   CC: ***  HPI:[name] is a 44 y.o. male who presents for evaluation of ***. Please see individual problem based A/P for details.  : ***  Plan: ***  ***: ***  Plan:  ***  ***: ***  Plan:  ***  Depression, PHQ-9: Based on the patients    Office Visit from 12/31/2019 in [place]  PHQ-9 Total Score  8     score we have ***.  Past Medical History:  Diagnosis Date  . Anal fistula   . Bell's palsy   . Benign neoplasm of meninges ([place])    gamma knife 05/28/15  . CAD (coronary artery disease)   . Chronic pain   . COPD (chronic obstructive pulmonary disease) ([place])   . Headache   . HIV (human immunodeficiency virus infection) ([place]) 12/27/2002   Review of Systems:  *** ROS negative except as per HPI.  Physical Exam: There were no vitals filed for this visit.   General: NAD, nl appearance HE: Normocephalic, atraumatic , EOMI, Conjunctivae normal ENT: No congestion, no rhinorrhea, no exudate or erythema  Cardiovascular: Normal rate, regular rhythm.  No murmurs, rubs, or gallops Pulmonary : Effort normal, breath sounds normal. No wheezes, rales, or rhonchi Abdominal: soft, nontender,  bowel sounds present Musculoskeletal: no swelling , deformity, injury ,or tenderness in extremities, Skin: Warm, dry , no bruising, erythema, or rash Psychiatric/Behavioral:  normal mood, normal behavior   Assessment & Plan:   See Encounters Tab for problem based charting.  Patient {GC/GE:3044014::"discussed with","seen with"} Dr. {NAMES:[id number]::"Butcher","Granfortuna","E. Hoffman","Guilloud","Mullen","Narendra","Raines","Vincent"}

## 2020-02-09 NOTE — Telephone Encounter (Signed)
Called patient regarding his missed appointment today with Dr. Court Joy.  Unable to leave message for this patient. No voice mail.

## 2020-02-10 DIAGNOSIS — R6889 Other general symptoms and signs: Secondary | ICD-10-CM | POA: Diagnosis not present

## 2020-03-08 ENCOUNTER — Other Ambulatory Visit: Payer: Self-pay | Admitting: Internal Medicine

## 2020-03-08 DIAGNOSIS — R0602 Shortness of breath: Secondary | ICD-10-CM

## 2020-03-08 DIAGNOSIS — R1084 Generalized abdominal pain: Secondary | ICD-10-CM | POA: Diagnosis not present

## 2020-03-08 DIAGNOSIS — Z1211 Encounter for screening for malignant neoplasm of colon: Secondary | ICD-10-CM | POA: Diagnosis not present

## 2020-03-08 DIAGNOSIS — R634 Abnormal weight loss: Secondary | ICD-10-CM | POA: Diagnosis not present

## 2020-03-08 DIAGNOSIS — R198 Other specified symptoms and signs involving the digestive system and abdomen: Secondary | ICD-10-CM | POA: Diagnosis not present

## 2020-03-08 MED ORDER — VENTOLIN HFA 108 (90 BASE) MCG/ACT IN AERS: INHALATION_SPRAY | RESPIRATORY_TRACT | 1 refills | Status: DC

## 2020-03-08 MED ORDER — TRAZODONE HCL 100 MG PO TABS: ORAL_TABLET | ORAL | 1 refills | Status: DC

## 2020-03-08 NOTE — Telephone Encounter (Signed)
Need refill on traZODone (DESYREL) 100 MG tablet  VENTOLIN HFA 108 (90 Base) MCG/ACT inhaler ;pt contact [phone]   [id number] - [place], [place] [facility] [address]

## 2020-03-09 ENCOUNTER — Other Ambulatory Visit (HOSPITAL_COMMUNITY)
Admission: RE | Admit: 2020-03-09 | Discharge: 2020-03-09 | Disposition: A | Discharge status: Home / Self Care | Payer: Medicare HMO | Source: Ambulatory Visit | Attending: Infectious Diseases | Admitting: Infectious Diseases

## 2020-03-09 ENCOUNTER — Other Ambulatory Visit: Payer: Self-pay

## 2020-03-09 ENCOUNTER — Other Ambulatory Visit: Payer: Medicare HMO

## 2020-03-09 DIAGNOSIS — Z113 Encounter for screening for infections with a predominantly sexual mode of transmission: Secondary | ICD-10-CM

## 2020-03-09 DIAGNOSIS — Z21 Asymptomatic human immunodeficiency virus [HIV] infection status: Secondary | ICD-10-CM | POA: Diagnosis not present

## 2020-03-10 LAB — URINE CYTOLOGY ANCILLARY ONLY
Chlamydia: NEGATIVE
Comment: NEGATIVE
Comment: NORMAL
Neisseria Gonorrhea: NEGATIVE

## 2020-03-10 LAB — T-HELPER CELL (CD4) - (RCID CLINIC ONLY)
CD4 % Helper T Cell: 31 % — ABNORMAL LOW (ref 33–65)
CD4 T Cell Abs: 1095 /uL (ref 400–1790)

## 2020-03-12 LAB — RPR: RPR Ser Ql: REACTIVE — AB

## 2020-03-12 LAB — RPR TITER: RPR Titer: 1:4 {titer} — ABNORMAL HIGH

## 2020-03-12 LAB — FLUORESCENT TREPONEMAL AB(FTA)-IGG-BLD: Fluorescent Treponemal ABS: REACTIVE — AB

## 2020-03-12 LAB — HIV-1 RNA QUANT-NO REFLEX-BLD
HIV 1 RNA Quant: <20 copies/mL
HIV-1 RNA Quant, Log: <1.3 Log copies/mL

## 2020-03-15 ENCOUNTER — Other Ambulatory Visit: Payer: Self-pay

## 2020-03-15 NOTE — Telephone Encounter (Signed)
VENTOLIN HFA 108 (90 Base) MCG/ACT inhaler   Refill request @  [id number] [place], [place] [phone] (Phone) [phone] (Fax)

## 2020-03-15 NOTE — Telephone Encounter (Signed)
Ventolin was refilled 03/08/20 x 1 RF. Called pt - no answer; left message to call [facility] and if he has any problems, call us back.

## 2020-03-17 ENCOUNTER — Telehealth: Payer: Self-pay | Admitting: *Deleted

## 2020-03-17 NOTE — Telephone Encounter (Signed)
Call to [employer] PA not required.  Need to use [place] # [phone].  Call to [facility] to give the correct [facility] #.  Sander Nephew, RN 03/17/2020 10:47 AM

## 2020-03-17 NOTE — Telephone Encounter (Signed)
Call from pt - stated his insurance will no longer cover Ventolin inh. Requesting change of medication. I asked Regino Schultze H to f/u also, ? may need PA.

## 2020-03-22 ENCOUNTER — Encounter: Payer: Self-pay | Admitting: Infectious Diseases

## 2020-03-22 ENCOUNTER — Ambulatory Visit (INDEPENDENT_AMBULATORY_CARE_PROVIDER_SITE_OTHER): Payer: Medicare HMO | Admitting: Infectious Diseases

## 2020-03-22 ENCOUNTER — Other Ambulatory Visit: Payer: Self-pay

## 2020-03-22 VITALS — BP 101/64 | HR 90 | Temp 98.5°F | Ht 68.0 in | Wt 152.0 lb

## 2020-03-22 DIAGNOSIS — Z21 Asymptomatic human immunodeficiency virus [HIV] infection status: Secondary | ICD-10-CM | POA: Diagnosis not present

## 2020-03-22 DIAGNOSIS — R1310 Dysphagia, unspecified: Secondary | ICD-10-CM | POA: Diagnosis not present

## 2020-03-22 DIAGNOSIS — F419 Anxiety disorder, unspecified: Secondary | ICD-10-CM

## 2020-03-22 DIAGNOSIS — R1319 Other dysphagia: Secondary | ICD-10-CM

## 2020-03-22 DIAGNOSIS — J302 Other seasonal allergic rhinitis: Secondary | ICD-10-CM

## 2020-03-22 DIAGNOSIS — M069 Rheumatoid arthritis, unspecified: Secondary | ICD-10-CM | POA: Diagnosis not present

## 2020-03-22 MED ORDER — MONTELUKAST SODIUM 10 MG PO TABS: 10.0000 mg | ORAL_TABLET | Freq: Every day | ORAL | 1 refills | Status: DC

## 2020-03-22 MED ORDER — FLUOXETINE HCL 40 MG PO CAPS: 40.0000 mg | ORAL_CAPSULE | Freq: Every day | ORAL | 3 refills | Status: DC

## 2020-03-22 NOTE — Patient Instructions (Addendum)
For your allergies - continue taking your allergy pill once a day.  You may have a better effect with Xyzal which is a stronger over the counter allergy pill but not sedating like Benadryl I will send in another allergy pill for you called Singulair - take this once a day with your over the counter allergy pill.   Refills have been sent in for your prozac - if you find that it is not working I would encourage you to work with your primary care team for adjustments in the dose or different pill altogether.    For your mother - moisture barrier cream will be a good thing to use for her to protect her skin from urine/stool.   Will have you back in 6 months with labs prior to you appointment again.

## 2020-03-31 ENCOUNTER — Other Ambulatory Visit: Payer: Self-pay | Admitting: Infectious Diseases

## 2020-03-31 DIAGNOSIS — R1319 Other dysphagia: Secondary | ICD-10-CM

## 2020-03-31 DIAGNOSIS — J302 Other seasonal allergic rhinitis: Secondary | ICD-10-CM | POA: Insufficient documentation

## 2020-03-31 NOTE — Assessment & Plan Note (Signed)
Will have him try Xyzal OTC and start Singulair once a night. Other symptomatic care and avoidance of allergens if possible discussed.

## 2020-03-31 NOTE — Assessment & Plan Note (Signed)
Remains under good control on Biktarvy with serial undetectable viral loads and healthy reconstitution of CD4 count - will continue this for him and ensure he has refills available.   Creatinine up slightly - encouraged to drink enough fluids to flush out kidneys. Has been up and down to mild degree in the past. Will continue to monitor. Urinalysis at upcoming visit.

## 2020-03-31 NOTE — Assessment & Plan Note (Signed)
Refill prozac as requested - if ineffective he will reach out to schedule appt with PCP for adjustment to regimen.

## 2020-03-31 NOTE — Assessment & Plan Note (Signed)
He has concerns over having food get hung up in throat with swallowing and frequently needs to attempt to clear throat to pass food chunks. Will refer back to Dr. Cristina Gong with [facility] for this problem.

## 2020-03-31 NOTE — Progress Notes (Signed)
Name: Jonathan Rangel  Date of Birth: 05-Aug-1976  MRN: [id number]  PCP: Madalyn Rob, MD   Patient Active Problem List   Diagnosis Date Noted  . Seasonal allergies 03/31/2020  . Esophageal dysphagia 03/31/2020  . Abdominal pain 10/16/2019  . Anxiety 01/23/2019  . Anal fistula 10/22/2018  . Healthcare maintenance 07/30/2018  . Amphetamine and psychostimulant-induced psychosis with delusions ([place]) 05/21/2018  . Substance induced mood disorder ([place]) 01/24/2018  . Abnormal liver function 11/23/2017  . Chronic pain   . HIV (human immunodeficiency virus infection) ([place]) 11/20/2017  . Rheumatoid arthritis ([place]) 11/20/2017  . Fibromyalgia 11/20/2017  . History of syphilis 01/14/2015     SUBJECTIVE:  Brief Narrative:  Jonathan Rangel is a 44 y.o. man with HIV disease first diagnosed in 2012. He has been historically under excellent control. In care previously in [place]. HIV Risk: MSM. HIV OIs: none   Previous Regimens:   Atripla >> suppressed  Biktarvy 2018 >> suppressed     CC: Routine follow-up HIV care. Abdominal pain and diarrhea.       HPI: Jonathan Rangel is doing well. He is dealing with some stressors at home with regards to the care of his mother but states it is all working out.  He has continued his Biktarvy without any missed doses. No concern for side effects.   Allergies have been particularly bad for him this year despite his best effort with OTC antihistamines. He has had ongoing cough with drainage and sore throat that are worse at night. He does take benadryl sometimes but it makes him sleepy. Hopeful he can have a prescription for something. Not so much with nasal congestion.   He is requesting a refill for prozac today for anxiety/depression to bridge until he can get in with PCP. Dose was previously effective for him.   Requesting assistance with GI referral to explore esophageal motility concern. States that he feels the food gets hung up easily.    Review of Systems    Constitutional: Negative for chills, fever, malaise/fatigue and weight loss.  HENT: Positive for sore throat. Negative for congestion, sinus pain and tinnitus.        No dental problems  Eyes: Positive for discharge.  Respiratory: Negative for cough and sputum production.   Cardiovascular: Negative for chest pain and leg swelling.  Gastrointestinal: Negative for abdominal pain, diarrhea and vomiting.       Swallowing difficulties, without pain   Genitourinary: Negative for dysuria and flank pain.  Musculoskeletal: Negative for back pain, joint pain, myalgias and neck pain.  Skin: Negative for rash.  Neurological: Negative for dizziness, tingling and headaches.  Psychiatric/Behavioral: Negative for depression and substance abuse. The patient is not nervous/anxious and does not have insomnia.     Past Medical History:  Diagnosis Date  . Anal fistula   . Bell's palsy   . Benign neoplasm of meninges ([place])    gamma knife 05/28/15  . CAD (coronary artery disease)   . Chronic pain   . COPD (chronic obstructive pulmonary disease) ([place])   . Headache   . HIV (human immunodeficiency virus infection) ([place]) 12/27/2002    Allergies  Allergen Reactions  . Keflex [Cephalexin] Palpitations    Social History   Tobacco Use  . Smoking status: Current Every Day Smoker    Types: Cigarettes  . Smokeless tobacco: Never Used  . Tobacco comment: 5-6 cigs per day  Substance Use Topics  . Alcohol use: Not Currently  . Drug use: No  Family History  Problem Relation Age of Onset  . Breast cancer Maternal Grandmother   . Cancer Maternal Aunt     Social History   Substance and Sexual Activity  Sexual Activity Never    Physical Exam and Objective Findings:  Vitals:   03/22/20 1136  BP: 101/64  Pulse: 90  Temp: 98.5 F (36.9 C)  SpO2: 100%  Weight: 152 lb (68.9 kg)  Height: 5\' 8"  (1.727 m)   Body mass index is 23.11 kg/m.  Physical Exam Constitutional:      Appearance: He is  well-developed.     Comments: Seated comfortably in chair during visit.   HENT:     Mouth/Throat:     Lips: Pink.     Mouth: Mucous membranes are moist. No oral lesions.     Dentition: Normal dentition. No dental abscesses.     Tongue: No lesions.     Pharynx: No pharyngeal swelling or oropharyngeal exudate.  Cardiovascular:     Rate and Rhythm: Normal rate and regular rhythm.     Heart sounds: Normal heart sounds.  Pulmonary:     Effort: Pulmonary effort is normal.     Breath sounds: Normal breath sounds.  Abdominal:     General: There is no distension.     Palpations: Abdomen is soft.     Tenderness: There is no abdominal tenderness.  Lymphadenopathy:     Cervical: No cervical adenopathy.  Skin:    General: Skin is warm and dry.     Findings: No rash.  Neurological:     Mental Status: He is alert and oriented to person, place, and time.  Psychiatric:        Judgment: Judgment normal.     Comments: In good spirits today and engaged in care discussion.      Lab Results Lab Results  Component Value Date   WBC 11.7 (H) 09/29/2019   HGB 14.6 09/29/2019   HCT 44.2 09/29/2019   MCV 93.2 09/29/2019   PLT 341 09/29/2019    Lab Results  Component Value Date   CREATININE 1.37 (H) 09/29/2019   BUN 14 09/29/2019   NA 139 09/29/2019   K 3.8 09/29/2019   CL 102 09/29/2019   CO2 27 09/29/2019    Lab Results  Component Value Date   ALT 9 09/29/2019   AST 18 09/29/2019   ALKPHOS 112 07/22/2018   BILITOT 0.5 09/29/2019    Lab Results  Component Value Date   CHOL 196 01/07/2019   HDL 50 01/07/2019   LDLCALC 129 (H) 01/07/2019   TRIG 74 01/07/2019   CHOLHDL 3.9 01/07/2019   HIV 1 RNA Quant (copies/mL)  Date Value  03/09/2020 <20 NOT DETECTED  09/29/2019 <20 NOT DETECTED  01/07/2019 <20 DETECTED (A)   CD4 T Cell Abs (/uL)  Date Value  03/09/2020 1,095  09/29/2019 1,275  01/07/2019 1,090   Lab Results  Component Value Date   HAV REACTIVE (A) 11/20/2017    Lab Results  Component Value Date   HEPBSAG NON-REACTIVE 11/20/2017   HEPBSAB BORDERLINE (A) 11/20/2017   ASSESSEMTN & PLAN:  Problem List Items Addressed This Visit      Unprioritized   HIV (human immunodeficiency virus infection) ([place]) - Primary (Chronic)    Remains under good control on Biktarvy with serial undetectable viral loads and healthy reconstitution of CD4 count - will continue this for him and ensure he has refills available.   Creatinine up slightly - encouraged to drink enough  fluids to flush out kidneys. Has been up and down to mild degree in the past. Will continue to monitor. Urinalysis at upcoming visit.       Relevant Orders   HIV-1 RNA quant-no reflex-bld   T-helper cell (CD4)- ([facility] clinic only)   CBC with Differential/Platelet   COMPLETE METABOLIC PANEL WITH GFR   RPR   Lipid panel   Anxiety    Refill prozac as requested - if ineffective he will reach out to schedule appt with PCP for adjustment to regimen.       Relevant Medications   FLUoxetine (PROZAC) 40 MG capsule   Seasonal allergies    Will have him try Xyzal OTC and start Singulair once a night. Other symptomatic care and avoidance of allergens if possible discussed.       Esophageal dysphagia    He has concerns over having food get hung up in throat with swallowing and frequently needs to attempt to clear throat to pass food chunks. Will refer back to Dr. Cristina Gong with [facility] for this problem.       Relevant Orders   Ambulatory referral to Gastroenterology      Janene Madeira, MSN, NP-[facility] [email or web address] Pager: [phone] Office: [place]: [phone]   03/31/2020  3:19 PM

## 2020-04-13 ENCOUNTER — Other Ambulatory Visit: Payer: Self-pay | Admitting: Infectious Diseases

## 2020-04-14 ENCOUNTER — Other Ambulatory Visit: Payer: Self-pay

## 2020-04-14 ENCOUNTER — Encounter: Payer: Self-pay | Admitting: Internal Medicine

## 2020-04-14 ENCOUNTER — Ambulatory Visit (INDEPENDENT_AMBULATORY_CARE_PROVIDER_SITE_OTHER): Payer: Medicare HMO | Admitting: Internal Medicine

## 2020-04-14 VITALS — BP 118/84 | HR 66 | Temp 98.5°F | Ht 68.0 in | Wt 155.9 lb

## 2020-04-14 DIAGNOSIS — M797 Fibromyalgia: Secondary | ICD-10-CM | POA: Diagnosis not present

## 2020-04-14 DIAGNOSIS — J302 Other seasonal allergic rhinitis: Secondary | ICD-10-CM | POA: Diagnosis not present

## 2020-04-14 MED ORDER — FLUTICASONE PROPIONATE 50 MCG/ACT NA SUSP: 1.0000 | Freq: Every day | NASAL | 2 refills | Status: DC

## 2020-04-14 NOTE — Assessment & Plan Note (Addendum)
Patient reports rhinorrhea, clear PND, and mild shortness of breath.  He thinks that it is mostly because of his seasonal allergy. On exam, he is afebrile, oropharyngeal is clear. lung is CTA bilaterally without wheezing. His symptoms are likely due to seasonal allergy.  Of note, he has soft, mobile, about 5 mm lymphadenopathy at anterior aspect of neck at left side which is likely residual lymphadenopathy due to recent tooth abscess that was extracted.  No acute finding or evidence of abscess or infection currently. I encouraged him to see his dentist again if developed pain or swelling around prior abscess area.   -Reordered Flonase -Instructed to pick up OTC Xyzal and montelukast that prescribed by ID -See dentist if develop any fever or pain, swelling around tooth extraction area

## 2020-04-14 NOTE — Progress Notes (Signed)
   CC: F/u of fibromyalgia  HPI:  Jonathan Rangel is a 44 y.o. male with PMHx as documented below, presented for follow-up of fibromyalgia . Please refer to problem based charting for further details and assessment and plan of current problem and chronic medical conditions.   Past Medical History:  Diagnosis Date  . Anal fistula   . Bell's palsy   . Benign neoplasm of meninges ([place])    gamma knife 05/28/15  . CAD (coronary artery disease)   . Chronic pain   . COPD (chronic obstructive pulmonary disease) ([place])   . Headache   . HIV (human immunodeficiency virus infection) ([place]) 12/27/2002   Review of Systems:  Review of Systems  Constitutional: Negative for chills and fever.  Cardiovascular: Negative for chest pain.  Gastrointestinal: Negative for abdominal pain, blood in stool, diarrhea, nausea and vomiting.  Musculoskeletal: Positive for myalgias.  Neurological: Negative for dizziness, focal weakness and weakness.  Psychiatric/Behavioral: The patient has insomnia.    Physical Exam:  Vitals:   04/14/20 1320  BP: 118/84  Pulse: 66  Temp: 98.5 F (36.9 C)  TempSrc: Oral  SpO2: 100%  Weight: 155 lb 14.4 oz (70.7 kg)  Height: 5\' 8"  (1.727 m)    Constitutional: Well-developed and well-nourished. No acute distress.  Head and neck: soft, mobile mildly tender LAP at ant left side with size of ~26ml  Pharynx: Clear w/o exudate. Slight gum swelling around the area of previous tooth extraction, no obvious abcess or evidence of infection Cardiovascular: RRR, nl S1S2,  no murmur, no LEE Respiratory: Effort normal and breath sounds normal. No respiratory distress. No wheezes.  GI: Soft. Bowel sounds are normal. No distension. There is no tenderness.  Neurological: Is alert and oriented x3  Skin: Not diaphoretic. No erythema.  Psychiatric: Normal mood and affect. Behavior is normal. Judgment and thought content normal.   Assessment & Plan:   See Encounters Tab for problem based  charting.  Patient discussed with Dr. Lynnae January

## 2020-04-14 NOTE — Assessment & Plan Note (Addendum)
He takes gabapentin but not all the time because it's effect does not last that much and if he takes 800 mg, it makes him drowsy and meks him being shaky and also cause him drooling. He mentions that he can function with hydrocodone. He does not take takes muscle relaxant now. He takes Fluoxetin for his mood.  He reports pain on shoulders, elbows, wrist, pulms and fingers. That is a constant pain. He sometimes wake up with pain if he had overexerted himself the night before. He takes care of his mother at home. Pain interferes with his daily activity.   He asks for refill on Hydrocodone. We discussed that. I reassured him that I understand it is hard to deal with fibromyalgia pain but opiate has not been confirmed by studies to have any benefit in terms control in fibromyalgia. I talked to him about other medical and nonmedical options, including changing fluoxetine to duloxetine, or adding amitriptyline, also considering exercise or other none medical treatments.  He mentions that he is not interested in changing current medications.  He would like to talk to his orthopedic surgeon on his next appointment about getting pain meds or refer him to pain clinic.   -Continue fluoxetine and gabapentin  -Follow-up with PCP in next availability (likely August).  -Consider changing fluoxetine to duloxetine versus adding amitriptyline when patient agrees to do so and if fibromyalgia pain not controlled

## 2020-04-14 NOTE — Patient Instructions (Addendum)
Thank you for allowing [place] to provide your care today. Today we discussed your fibromyalgia pain. Studies showed there is no evidence that support the benefit of opioid and fibromyalgia. I recommend you to take gabapentin and fluoxetine. I respect your wish about not change or add other medicines such as amitriptyline. For your allergic symptoms, I send a prescription for Flonase.  Also take over-the-counter allergy medicines : Xyzal. that was prescribed by case his disease.  They also prescribed montelukast.  Pick that up from pharmacy and take it as instructed.  I recommend you to see your dentist to follow-up of your tooth abscess to see if you need further management or antibiotic. Please follow-up with your primary care in next available time. Please take rest of your medications as before  Should you have any questions or concerns please call the internal medicine clinic at [phone].

## 2020-04-15 NOTE — Progress Notes (Signed)
Internal Medicine Clinic Attending  Case discussed with Dr. Masoudi  at the time of the visit.  We reviewed the resident's history and exam and pertinent patient test results.  I agree with the assessment, diagnosis, and plan of care documented in the resident's note.  

## 2020-04-20 ENCOUNTER — Other Ambulatory Visit: Payer: Medicare Other

## 2020-04-21 ENCOUNTER — Ambulatory Visit (INDEPENDENT_AMBULATORY_CARE_PROVIDER_SITE_OTHER): Payer: Medicare HMO | Admitting: Orthopaedic Surgery

## 2020-04-21 ENCOUNTER — Encounter: Payer: Self-pay | Admitting: Orthopaedic Surgery

## 2020-04-21 ENCOUNTER — Other Ambulatory Visit: Payer: Self-pay

## 2020-04-21 DIAGNOSIS — M79601 Pain in right arm: Secondary | ICD-10-CM | POA: Diagnosis not present

## 2020-04-21 DIAGNOSIS — M79602 Pain in left arm: Secondary | ICD-10-CM

## 2020-04-21 DIAGNOSIS — R6889 Other general symptoms and signs: Secondary | ICD-10-CM | POA: Diagnosis not present

## 2020-04-21 NOTE — Progress Notes (Signed)
Office Visit Note   Patient: Jonathan Rangel           Date of Birth: October 23, 1976           MRN: [id number] Visit Date: 04/21/2020              Requested by: Madalyn Rob, MD [address] [place],  [place] [address] PCP: Madalyn Rob, MD   Assessment & Plan: Visit Diagnoses:  1. Bilateral arm pain     Plan: Impression is bilateral arm pain likely due to chronic pain syndrome and fibromyalgia with possible neuropathy.  We will obtain nerve conduction studies of both upper extremities.  We will also order an MRI of the C-spine to evaluate for structural abnormalities.  We will also make referral to a pain management clinic here in [place].  Follow-up after the MRI and nerve conduction studies.  Follow-Up Instructions: Return if symptoms worsen or fail to improve.   Orders:  No orders of the defined types were placed in this encounter.  No orders of the defined types were placed in this encounter.     Procedures: No procedures performed   Clinical Data: No additional findings.   Subjective: Chief Complaint  Patient presents with  . Right Shoulder - Pain    PATIENT STATES HE HAS PAIN FROM BIL SHOULDERS ALL THE WAY DOWN INTO HIS FINGERTIPS. WORSE ON THE RIGHT SIDE.   Marland Kitchen Left Shoulder - Pain  . Left Hand - Pain  . Right Hand - Pain    Jonathan Rangel is a 44 year old gentleman who recently relocated here from [place] comes in for evaluation of bilateral arm pain with tingling numbness and burning.  He has had 2 strokes that have affected his right side.  He takes gabapentin twice a day to ease the pain but it does make him drowsy.  He is status post right carpal tunnel release and right cubital tunnel release in [place].  He was in chronic pain management in [place].  He also has fibromyalgia and positive rheumatoid factor.  He states that he has bilateral shoulder pain that radiates down into the fingertips.   Review of Systems  Constitutional: Negative.   All other  systems reviewed and are negative.    Objective: Vital Signs: There were no vitals taken for this visit.  Physical Exam Vitals and nursing note reviewed.  Constitutional:      Appearance: He is well-developed.  HENT:     Head: Normocephalic and atraumatic.  Eyes:     Pupils: Pupils are equal, round, and reactive to light.  Pulmonary:     Effort: Pulmonary effort is normal.  Abdominal:     Palpations: Abdomen is soft.  Musculoskeletal:        General: Normal range of motion.     Cervical back: Neck supple.  Skin:    General: Skin is warm.  Neurological:     Mental Status: He is alert and oriented to person, place, and time.  Psychiatric:        Behavior: Behavior normal.        Thought Content: Thought content normal.        Judgment: Judgment normal.     Ortho Exam Bilateral shoulders are tender in the trapezius and suprascapular region.  Muscle strength is fairly well-preserved to manual muscle testing.  Well-healed surgical scars. Specialty Comments:  No specialty comments available.  Imaging: No results found.   PMFS History: Patient Active Problem List   Diagnosis  Date Noted  . Seasonal allergies 03/31/2020  . Esophageal dysphagia 03/31/2020  . Abdominal pain 10/16/2019  . Anxiety 01/23/2019  . Anal fistula 10/22/2018  . Healthcare maintenance 07/30/2018  . Amphetamine and psychostimulant-induced psychosis with delusions ([place]) 05/21/2018  . Substance induced mood disorder ([place]) 01/24/2018  . Abnormal liver function 11/23/2017  . Chronic pain   . HIV (human immunodeficiency virus infection) ([place]) 11/20/2017  . Rheumatoid arthritis ([place]) 11/20/2017  . Fibromyalgia 11/20/2017  . History of syphilis 01/14/2015   Past Medical History:  Diagnosis Date  . Anal fistula   . Bell's palsy   . Benign neoplasm of meninges ([place])    gamma knife 05/28/15  . CAD (coronary artery disease)   . Chronic pain   . COPD (chronic obstructive pulmonary disease) ([place])   .  Headache   . HIV (human immunodeficiency virus infection) ([place]) 12/27/2002    Family History  Problem Relation Age of Onset  . Breast cancer Maternal Grandmother   . Cancer Maternal Aunt     Past Surgical History:  Procedure Laterality Date  . CARPAL TUNNEL RELEASE    . CHOLECYSTECTOMY    . COLONOSCOPY  2015   patient reported normal   . Stereotactic destruction of lesion using gamma radiation  05/28/2015   right temporal lobe   . TONSILLECTOMY     Social History   Occupational History  . Not on file  Tobacco Use  . Smoking status: Current Every Day Smoker    Types: Cigarettes  . Smokeless tobacco: Never Used  . Tobacco comment: 5-6 cigs per day  Substance and Sexual Activity  . Alcohol use: Not Currently  . Drug use: Yes    Types: Marijuana  . Sexual activity: Not on file

## 2020-04-22 ENCOUNTER — Other Ambulatory Visit: Payer: Self-pay

## 2020-04-22 DIAGNOSIS — M79602 Pain in left arm: Secondary | ICD-10-CM

## 2020-04-22 DIAGNOSIS — M542 Cervicalgia: Secondary | ICD-10-CM

## 2020-04-22 DIAGNOSIS — M79601 Pain in right arm: Secondary | ICD-10-CM

## 2020-04-26 ENCOUNTER — Other Ambulatory Visit: Payer: Self-pay

## 2020-04-26 DIAGNOSIS — M79602 Pain in left arm: Secondary | ICD-10-CM

## 2020-04-26 DIAGNOSIS — M542 Cervicalgia: Secondary | ICD-10-CM

## 2020-04-28 ENCOUNTER — Telehealth: Payer: Self-pay | Admitting: Orthopaedic Surgery

## 2020-04-28 NOTE — Telephone Encounter (Signed)
Pt called stating the Dr. He was previously seeing has retired and his pt given off to other Drs. So the pt wasn't able to get his records; pt would like to know if he could just have all the tests redone?  [phone]

## 2020-04-29 NOTE — Telephone Encounter (Signed)
Order for MRI and EMG/NCV in chart. I have called and LMVM for patient will proceed with these studies.

## 2020-04-29 NOTE — Telephone Encounter (Signed)
Please advise. Thanks.  

## 2020-04-29 NOTE — Telephone Encounter (Signed)
Sure

## 2020-04-30 ENCOUNTER — Telehealth: Payer: Self-pay | Admitting: Orthopaedic Surgery

## 2020-04-30 NOTE — Telephone Encounter (Signed)
Per note in chart patient scheduled for 07/02

## 2020-04-30 NOTE — Telephone Encounter (Signed)
Patient called stating he received the message from [place] and want to proceed with the orders. Patient is scheduled for MRI on 05/14/2020. The number to contact patient if needed is [phone]

## 2020-05-03 ENCOUNTER — Other Ambulatory Visit: Payer: Self-pay | Admitting: Gastroenterology

## 2020-05-03 DIAGNOSIS — R1310 Dysphagia, unspecified: Secondary | ICD-10-CM

## 2020-05-07 ENCOUNTER — Other Ambulatory Visit: Payer: Self-pay | Admitting: Infectious Diseases

## 2020-05-11 ENCOUNTER — Telehealth: Payer: Medicare Other | Admitting: Infectious Diseases

## 2020-05-14 ENCOUNTER — Ambulatory Visit
Admission: RE | Admit: 2020-05-14 | Discharge: 2020-05-14 | Disposition: A | Discharge status: Home / Self Care | Payer: Medicare HMO | Source: Ambulatory Visit | Attending: Orthopaedic Surgery | Admitting: Orthopaedic Surgery

## 2020-05-14 ENCOUNTER — Other Ambulatory Visit: Payer: Self-pay

## 2020-05-14 DIAGNOSIS — M2578 Osteophyte, vertebrae: Secondary | ICD-10-CM | POA: Diagnosis not present

## 2020-05-14 DIAGNOSIS — M50123 Cervical disc disorder at C6-C7 level with radiculopathy: Secondary | ICD-10-CM | POA: Diagnosis not present

## 2020-05-14 DIAGNOSIS — R6889 Other general symptoms and signs: Secondary | ICD-10-CM | POA: Diagnosis not present

## 2020-05-14 DIAGNOSIS — M542 Cervicalgia: Secondary | ICD-10-CM

## 2020-05-14 DIAGNOSIS — M50122 Cervical disc disorder at C5-C6 level with radiculopathy: Secondary | ICD-10-CM | POA: Diagnosis not present

## 2020-05-14 DIAGNOSIS — M4722 Other spondylosis with radiculopathy, cervical region: Secondary | ICD-10-CM | POA: Diagnosis not present

## 2020-05-25 ENCOUNTER — Encounter: Payer: Self-pay | Admitting: Orthopaedic Surgery

## 2020-05-25 ENCOUNTER — Ambulatory Visit (INDEPENDENT_AMBULATORY_CARE_PROVIDER_SITE_OTHER): Payer: Medicare HMO | Admitting: Orthopaedic Surgery

## 2020-05-25 VITALS — Ht 67.5 in | Wt 158.0 lb

## 2020-05-25 DIAGNOSIS — M542 Cervicalgia: Secondary | ICD-10-CM

## 2020-05-25 DIAGNOSIS — M79602 Pain in left arm: Secondary | ICD-10-CM | POA: Diagnosis not present

## 2020-05-25 DIAGNOSIS — R6889 Other general symptoms and signs: Secondary | ICD-10-CM | POA: Diagnosis not present

## 2020-05-25 DIAGNOSIS — M79601 Pain in right arm: Secondary | ICD-10-CM | POA: Diagnosis not present

## 2020-05-25 NOTE — Progress Notes (Signed)
Office Visit Note   Patient: Jonathan Rangel           Date of Birth: [birth year]           MRN: [id number] Visit Date: 05/25/2020              Requested by: Madalyn Rob, MD [address] [place],  [place] [address] PCP: Madalyn Rob, MD   Assessment & Plan: Visit Diagnoses:  1. Cervicalgia   2. Bilateral arm pain     Plan: Cervical spine MRI shows multilevel disc osteophyte complex with mild spinal and uncal vertebral narrowing.  No myelomalacia.  These findings were reviewed with the patient in detail.  We have already made a referral to a pain management clinic.  He also has an upcoming appointment with [facility] for nerve conduction studies.  From my standpoint we can see him back as needed.  Follow-Up Instructions: Return if symptoms worsen or fail to improve.   Orders:  No orders of the defined types were placed in this encounter.  No orders of the defined types were placed in this encounter.     Procedures: No procedures performed   Clinical Data: No additional findings.   Subjective: Chief Complaint  Patient presents with  . Neck - Follow-up    MRI review    Jonathan Rangel is 44 year old gentleman here for C-spine MRI review.  Denies any changes.   Review of Systems   Objective: Vital Signs: Ht 5' 7.5" (1.715 m)   Wt 158 lb (71.7 kg)   BMI 24.38 kg/m   Physical Exam  Ortho Exam Exam is unchanged. Specialty Comments:  No specialty comments available.  Imaging: No results found.   PMFS History: Patient Active Problem List   Diagnosis Date Noted  . Seasonal allergies 03/31/2020  . Esophageal dysphagia 03/31/2020  . Abdominal pain 10/16/2019  . Anxiety 01/23/2019  . Anal fistula 10/22/2018  . Healthcare maintenance 07/30/2018  . Amphetamine and psychostimulant-induced psychosis with delusions ([place]) 05/21/2018  . Substance induced mood disorder ([place]) 01/24/2018  . Abnormal liver function 11/23/2017  . Chronic pain   . HIV  (human immunodeficiency virus infection) ([place]) 11/20/2017  . Rheumatoid arthritis ([place]) 11/20/2017  . Fibromyalgia 11/20/2017  . History of syphilis 01/14/2015   Past Medical History:  Diagnosis Date  . Anal fistula   . Bell's palsy   . Benign neoplasm of meninges ([place])    gamma knife 05/28/15  . CAD (coronary artery disease)   . Chronic pain   . COPD (chronic obstructive pulmonary disease) ([place])   . Headache   . HIV (human immunodeficiency virus infection) ([place]) 12/27/2002    Family History  Problem Relation Age of Onset  . Breast cancer Maternal Grandmother   . Cancer Maternal Aunt     Past Surgical History:  Procedure Laterality Date  . CARPAL TUNNEL RELEASE    . CHOLECYSTECTOMY    . COLONOSCOPY  2015   patient reported normal   . Stereotactic destruction of lesion using gamma radiation  05/28/2015   right temporal lobe   . TONSILLECTOMY     Social History   Occupational History  . Not on file  Tobacco Use  . Smoking status: Current Every Day Smoker    Types: Cigarettes  . Smokeless tobacco: Never Used  . Tobacco comment: 5-6 cigs per day  Substance and Sexual Activity  . Alcohol use: Not Currently  . Drug use: Yes    Types: Marijuana  .  Sexual activity: Not on file

## 2020-05-27 ENCOUNTER — Ambulatory Visit
Admission: RE | Admit: 2020-05-27 | Discharge: 2020-05-27 | Disposition: A | Discharge status: Home / Self Care | Payer: Medicare HMO | Source: Ambulatory Visit | Attending: Gastroenterology | Admitting: Gastroenterology

## 2020-05-27 DIAGNOSIS — R1310 Dysphagia, unspecified: Secondary | ICD-10-CM | POA: Diagnosis not present

## 2020-05-27 DIAGNOSIS — R6889 Other general symptoms and signs: Secondary | ICD-10-CM | POA: Diagnosis not present

## 2020-06-09 ENCOUNTER — Other Ambulatory Visit: Payer: Self-pay

## 2020-06-09 ENCOUNTER — Other Ambulatory Visit: Payer: Self-pay | Admitting: Infectious Diseases

## 2020-06-09 MED ORDER — TRAZODONE HCL 100 MG PO TABS: ORAL_TABLET | ORAL | 1 refills | Status: DC

## 2020-06-09 NOTE — Telephone Encounter (Signed)
Please advise on refill.

## 2020-06-09 NOTE — Telephone Encounter (Signed)
traZODone (DESYREL) 100 MG tablet,  gabapentin (NEURONTIN) 400 MG capsule, REFILL REQUEST @  [id number] [address] [place], [place] Phone:  [phone]  Fax:  [phone]

## 2020-06-11 DIAGNOSIS — R6889 Other general symptoms and signs: Secondary | ICD-10-CM | POA: Diagnosis not present

## 2020-06-24 ENCOUNTER — Ambulatory Visit: Payer: Medicare HMO | Admitting: Neurology

## 2020-06-24 ENCOUNTER — Telehealth: Payer: Self-pay

## 2020-06-24 ENCOUNTER — Encounter: Payer: Self-pay | Admitting: Neurology

## 2020-06-24 NOTE — Telephone Encounter (Signed)
Pt no showed 06/24/2020 new pt appt with Dr. Krista Blue. Pt had a previous no show back in 2019 with Dr. Jaynee Eagles.

## 2020-07-15 ENCOUNTER — Telehealth: Payer: Self-pay | Admitting: Orthopaedic Surgery

## 2020-07-15 DIAGNOSIS — R6889 Other general symptoms and signs: Secondary | ICD-10-CM | POA: Diagnosis not present

## 2020-07-15 NOTE — Telephone Encounter (Signed)
Ok for neck brace? Does he really need one?

## 2020-07-15 NOTE — Telephone Encounter (Signed)
Patient called.   He wants a neck brace but his insurance needs a proof of necessity from our office first. He is also requesting a call back to discuss his worsening symptoms   Patient call back: [phone]

## 2020-07-16 ENCOUNTER — Emergency Department (HOSPITAL_COMMUNITY)
Admission: EM | Admit: 2020-07-16 | Discharge: 2020-07-17 | Disposition: A | Discharge status: Home / Self Care | Payer: Medicare HMO | Attending: Emergency Medicine | Admitting: Emergency Medicine

## 2020-07-16 ENCOUNTER — Encounter (HOSPITAL_COMMUNITY): Payer: Self-pay | Admitting: *Deleted

## 2020-07-16 ENCOUNTER — Other Ambulatory Visit: Payer: Self-pay

## 2020-07-16 DIAGNOSIS — L02511 Cutaneous abscess of right hand: Secondary | ICD-10-CM | POA: Insufficient documentation

## 2020-07-16 DIAGNOSIS — Z5321 Procedure and treatment not carried out due to patient leaving prior to being seen by health care provider: Secondary | ICD-10-CM | POA: Diagnosis not present

## 2020-07-16 NOTE — ED Triage Notes (Signed)
Pt reports possible insect bite and swelling to right middle finger/hand 3 days. Denies fever.

## 2020-07-16 NOTE — Telephone Encounter (Signed)
Talked with patient and advised him of your message below.  Patient would like to know what he can do?  Stated that his neck and back are both in pain.  Cb# [phone].  Please advise.  Thank you.

## 2020-07-16 NOTE — Telephone Encounter (Signed)
We have made a referral to pain management which I believe is pending.  I'm not sure if there's much else to do.

## 2020-07-16 NOTE — Telephone Encounter (Signed)
There is no neck brace that I can think of that addresses his neck issues . Thanks.

## 2020-07-16 NOTE — ED Notes (Signed)
Pt stated had something to do and that pt could not stay.

## 2020-07-20 NOTE — Telephone Encounter (Signed)
Called and left a VM advising patient of Dr. Phoebe Sharps message below.

## 2020-07-22 ENCOUNTER — Encounter: Payer: Self-pay | Admitting: Physical Medicine and Rehabilitation

## 2020-07-26 ENCOUNTER — Emergency Department (HOSPITAL_COMMUNITY)
Admission: EM | Admit: 2020-07-26 | Discharge: 2020-07-26 | Disposition: A | Discharge status: Home / Self Care | Payer: Medicare HMO | Attending: Emergency Medicine | Admitting: Emergency Medicine

## 2020-07-26 ENCOUNTER — Telehealth: Payer: Self-pay | Admitting: *Deleted

## 2020-07-26 ENCOUNTER — Encounter (HOSPITAL_COMMUNITY): Payer: Self-pay | Admitting: Emergency Medicine

## 2020-07-26 DIAGNOSIS — B86 Scabies: Secondary | ICD-10-CM | POA: Diagnosis not present

## 2020-07-26 DIAGNOSIS — R21 Rash and other nonspecific skin eruption: Secondary | ICD-10-CM | POA: Diagnosis present

## 2020-07-26 DIAGNOSIS — B0089 Other herpesviral infection: Secondary | ICD-10-CM | POA: Diagnosis not present

## 2020-07-26 DIAGNOSIS — I2510 Atherosclerotic heart disease of native coronary artery without angina pectoris: Secondary | ICD-10-CM | POA: Insufficient documentation

## 2020-07-26 DIAGNOSIS — J449 Chronic obstructive pulmonary disease, unspecified: Secondary | ICD-10-CM | POA: Diagnosis not present

## 2020-07-26 DIAGNOSIS — F17210 Nicotine dependence, cigarettes, uncomplicated: Secondary | ICD-10-CM | POA: Insufficient documentation

## 2020-07-26 MED ORDER — PERMETHRIN 5 % EX CREA: TOPICAL_CREAM | CUTANEOUS | 0 refills | Status: DC

## 2020-07-26 MED ORDER — ACYCLOVIR 400 MG PO TABS: 800.0000 mg | ORAL_TABLET | Freq: Two times a day (BID) | ORAL | 0 refills | Status: AC

## 2020-07-26 NOTE — Telephone Encounter (Signed)
Patient left message in triage asking for a call back. RN noted patient was in-patient status at [facility] ED.  RN returned the call after patient was discharge, left message asking Mary to call back if he still needed advice/assistance. Landis Gandy, RN

## 2020-07-26 NOTE — ED Provider Notes (Signed)
[facility] Provider Note   CSN: [id number] Arrival date & time: 07/26/20  1027     History Chief Complaint  Patient presents with  . Rash    [facility] is a 44 y.o. male.  HPI Patient is 44 year old male with a past medical history significant for COPD, HIV, CAD  Patient is on [place] states he has had no missed doses and follows with infectious disease. Patient states that he is right middle and left middle finger are painful, somewhat itchy burning and supremely uncomfortable.  He states that the discomfort is constant.  No aggravating or mitigating factors.  He has taken nothing prior to arrival for pain. Patient states he has no significant associated symptoms.  Has a history of chickenpox and states that it feels like the time that he had shingles.   I reviewed patient's EMR at his most recent 4 months and 10 months checkups and labs he had undetectable HIV RNA and had normal absolute CD4 count. Patient states he is compliant with his [place] [employer] and sees his internal medicine doctor, regularly as scheduled.      Past Medical History:  Diagnosis Date  . Anal fistula   . Bell's palsy   . Benign neoplasm of meninges ([place])    gamma knife 05/28/15  . CAD (coronary artery disease)   . Chronic pain   . COPD (chronic obstructive pulmonary disease) ([place])   . Headache   . HIV (human immunodeficiency virus infection) ([place]) 12/27/2002    Patient Active Problem List   Diagnosis Date Noted  . Seasonal allergies 03/31/2020  . Esophageal dysphagia 03/31/2020  . Abdominal pain 10/16/2019  . Anxiety 01/23/2019  . Anal fistula 10/22/2018  . Healthcare maintenance 07/30/2018  . Amphetamine and psychostimulant-induced psychosis with delusions ([place]) 05/21/2018  . Substance induced mood disorder ([place]) 01/24/2018  . Abnormal liver function 11/23/2017  . Chronic pain   . HIV (human immunodeficiency virus infection) ([place]) 11/20/2017  .  Rheumatoid arthritis ([place]) 11/20/2017  . Fibromyalgia 11/20/2017  . History of syphilis 01/14/2015    Past Surgical History:  Procedure Laterality Date  . CARPAL TUNNEL RELEASE    . CHOLECYSTECTOMY    . COLONOSCOPY  2015   patient reported normal   . Stereotactic destruction of lesion using gamma radiation  05/28/2015   right temporal lobe   . TONSILLECTOMY         Family History  Problem Relation Age of Onset  . Breast cancer Maternal Grandmother   . Cancer Maternal Aunt     Social History   Tobacco Use  . Smoking status: Current Every Day Smoker    Types: Cigarettes  . Smokeless tobacco: Never Used  . Tobacco comment: 5-6 cigs per day  Substance Use Topics  . Alcohol use: Not Currently  . Drug use: Yes    Types: Marijuana    Home Medications Prior to Admission medications   Medication Sig Start Date End Date Taking? Authorizing Provider  acyclovir (ZOVIRAX) 400 MG tablet Take 2 tablets (800 mg total) by mouth 2 (two) times daily for 7 days. 07/26/20 08/02/20  Tedd Sias, PA  bictegravir-emtricitabine-tenofovir AF (BIKTARVY) 50-200-25 MG TABS tablet Take 1 tablet by mouth daily. Try to take at the same time each day with or without food. 10/20/19   [place] Callas, NP  cholestyramine (QUESTRAN) 4 g packet Take 1 packet (4 g total) by mouth daily. 10/16/19   Asencion Noble, MD  ciprofloxacin (CIPRO)  500 MG tablet Take 1 tablet (500 mg total) by mouth 2 (two) times daily. 04/10/19   Stanley Callas, NP  FLUoxetine (PROZAC) 40 MG capsule Take 1 capsule (40 mg total) by mouth daily. 03/22/20   [place] Callas, NP  fluticasone (FLONASE) 50 MCG/ACT nasal spray Place 1 spray into both nostrils daily. 04/14/20   Masoudi, Elhamalsadat, MD  gabapentin (NEURONTIN) 400 MG capsule TAKE 1 CAPSULE BY MOUTH 3 TIMES DAILY. 06/09/20   [place] Callas, NP  ibuprofen (ADVIL,MOTRIN) 200 MG tablet Take 600 mg by mouth every 6 (six) hours as needed for moderate pain.     [provider]  metroNIDAZOLE (FLAGYL) 500 MG tablet Take 1 tablet (500 mg total) by mouth 2 (two) times daily. 04/10/19   [place] Callas, NP  montelukast (SINGULAIR) 10 MG tablet TAKE 1 TABLET BY MOUTH EVERYDAY AT BEDTIME 04/13/20   [place] Callas, NP  permethrin (ELIMITE) 5 % cream Apply to affected area once--leave on for 12 hours and then wash off 07/26/20   Owen Pratte, Salcha S, PA  traZODone (DESYREL) 100 MG tablet TAKE 1 TABLET BY MOUTH EVERYDAY AT BEDTIME 06/09/20   [place], Carley D, DO  VENTOLIN HFA 108 (90 Base) MCG/ACT inhaler INHALE 1-2 PUFFS INTO THE LUNGS EVERY 6 (SIX) HOURS AS NEEDED FOR WHEEZING OR SHORTNESS OF BREATH. 03/08/20   Madalyn Rob, MD  gabapentin (NEURONTIN) 400 MG capsule Take 1 capsule (400 mg total) by mouth 3 (three) times daily. 12/10/19   [place] Callas, NP    Allergies    Keflex [cephalexin]  Review of Systems   Review of Systems  Constitutional: Positive for fatigue. Negative for chills and fever.  HENT: Negative for congestion.   Respiratory: Negative for shortness of breath.   Cardiovascular: Negative for chest pain.  Gastrointestinal: Negative for abdominal pain.  Musculoskeletal: Positive for myalgias. Negative for neck pain.  Skin: Positive for rash.    Physical Exam Updated Vital Signs BP (!) 126/96 (BP Location: Left Arm)   Pulse 66   Temp 98.6 F (37 C) (Oral)   Resp 16   Ht 5\' 8"  (1.727 m)   Wt 71.7 kg   SpO2 100%   BMI 24.02 kg/m   Physical Exam Vitals and nursing note reviewed.  Constitutional:      General: He is not in acute distress.    Comments: Pleasant 44 year old male in no acute distress sitting comfortably in bed.  HENT:     Head: Normocephalic and atraumatic.     Nose: Nose normal.     Mouth/Throat:     Mouth: Mucous membranes are moist.  Eyes:     General: No scleral icterus. Cardiovascular:     Rate and Rhythm: Normal rate and regular rhythm.     Pulses: Normal pulses.     Heart sounds: Normal  heart sounds.  Pulmonary:     Effort: Pulmonary effort is normal. No respiratory distress.     Breath sounds: Normal breath sounds. No wheezing.  Abdominal:     Palpations: Abdomen is soft.     Tenderness: There is no abdominal tenderness. There is no guarding or rebound.  Musculoskeletal:     Cervical back: Normal range of motion.     Right lower leg: No edema.     Left lower leg: No edema.     Comments: Full range of motion of bilateral hands and all fingers.  Mild tenderness to touch of the right middle finger starting at the  PIP and extending to the end of the finger.  Good cap refill of all fingers.  No bony deformity.  See skin exam.  Skin:    General: Skin is warm and dry.     Capillary Refill: Capillary refill takes less than 2 seconds.     Comments: Excoriation herpetic lesion to the left and right middle finger with significantly worse excoriations on the right middle finger.  There is evidence of herpetic whitlow.  Neurological:     Mental Status: He is alert. Mental status is at baseline.     Comments: Sensation intact in all fingertips  Psychiatric:        Mood and Affect: Mood normal.        Behavior: Behavior normal.           ED Results / Procedures / Treatments   Labs (all labs ordered are listed, but only abnormal results are displayed) Labs Reviewed - No data to display  EKG None  Radiology No results found.  Procedures Procedures (including critical care time)  Medications Ordered in ED Medications - No data to display  ED Course  I have reviewed the triage vital signs and the nursing notes.  Pertinent labs & imaging results that were available during my care of the patient were reviewed by me and considered in my medical decision making (see chart for details).    MDM Rules/Calculators/A&P                           Patient is a 44 year old male with past medical history detailed above presented with bilateral lateral finger pain and  swelling.  Physical exam notable for likely herpetic whitlow.  Vital signs are within normal limits.  He also has had some congestion he has no allergies.  Discussed with Apolonio Schneiders of pharmacy.  No contraindications to giving acyclovir at this time.  He will follow-up closely with his PCP to recheck the skin of his middle fingers to make sure that he does not have a bacterial superinfection and to follow-up on his medication management symptoms.  Patient is understanding of plan.  Vitals within normal limits.  Prescribed permethrin cream for possible scabies and acyclovir for likely herpetic whitlow.  The medical records were personally reviewed by myself. I personally reviewed all lab results and interpreted all imaging studies and either concurred with their official read or contacted radiology for clarification. Additional history obtained from old records.  This patient appears reasonably screened and I doubt any other medical condition requiring further workup, evaluation, or treatment in the ED at this time prior to discharge.   Patient's vitals are WNL apart from vital sign abnormalities discussed above, patient is in NAD, and able to ambulate in the ED at their baseline and able to tolerate PO.  Pain has been managed or a plan has been made for home management and has no complaints prior to discharge. Patient is comfortable with above plan and for discharge at this time. All questions were answered prior to disposition. Results from the ER workup discussed with the patient face to face and all questions answered to the best of my ability. The patient is safe for discharge with strict return precautions. Patient appears safe for discharge with appropriate follow-up. Conveyed my impression with the patient and they voiced understanding and are agreeable to plan.   An After Visit Summary was printed and given to the patient.  Portions of this note were  generated with [employer].  Dictation errors may occur despite best attempts at proofreading.   Patient discharged with appropriate therapy and close follow-up. Given return precautions.   Final Clinical Impression(s) / ED Diagnoses Final diagnoses:  Herpetic whitlow  Scabies    Rx / DC Orders ED Discharge Orders         Ordered    permethrin (ELIMITE) 5 % cream        07/26/20 1235    acyclovir (ZOVIRAX) 400 MG tablet  2 times daily        07/26/20 1235           Pati Gallo [place], [place] 07/27/20 2002    Tegeler, Gwenyth Allegra, MD 07/28/20 1742

## 2020-07-26 NOTE — ED Triage Notes (Signed)
Pt has rash to right middle finger and left middle finger. Pt reports this first started after his second covid vaccine on 06/24/20.

## 2020-07-26 NOTE — Discharge Instructions (Addendum)
The rash on your fingers is consistent with herpetic whitlow versus possible scabies.  I will cover you with both of these.  The medication for herpetic whitlow is an oral medication/tablet.  Please take this as prescribed.  He was called acyclovir.  He will take it twice daily until you run out of tablets.  You will also use permethrin cream once -- apply and leave on skin for 12 hours. Then wash.    FOLLOW UP WITH YOUR PRIMARY CARE DOCTOR WITHIN THE WEEK

## 2020-07-29 ENCOUNTER — Telehealth: Payer: Self-pay

## 2020-07-29 NOTE — Telephone Encounter (Signed)
Patient called office today requesting appointment with Doren Custard, NP regarding recent ED visit. Patient advised to contact PCP for appointment. Would like for NP to review note and prescribe something for his finger is possible.  Encouraged patient to contact PCP since provider is not in office.  [place]

## 2020-07-29 NOTE — Telephone Encounter (Signed)
It's something I really need to see him for to provide more recommendations.  Seems they are treating for herpetic infection-has he had any improvement on the acyclovir? Can he upload a current photo to [facility] portal so I can evaluate?

## 2020-07-29 NOTE — Telephone Encounter (Signed)
Called patient and requested he upload picture of finger on mychart. Patient will try to do this today. States rash has improved,but new rash has broken out on another finger.  [place]

## 2020-07-30 MED ORDER — MUPIROCIN 2 % EX OINT: 1.0000 "application " | TOPICAL_OINTMENT | Freq: Two times a day (BID) | CUTANEOUS | 0 refills | Status: DC

## 2020-07-30 NOTE — Addendum Note (Signed)
Addended by: [place] Callas on: 07/30/2020 12:23 PM   Modules accepted: Orders

## 2020-08-02 ENCOUNTER — Other Ambulatory Visit: Payer: Self-pay | Admitting: Internal Medicine

## 2020-08-02 DIAGNOSIS — R0602 Shortness of breath: Secondary | ICD-10-CM

## 2020-08-03 MED ORDER — ACYCLOVIR 400 MG PO TABS: 800.0000 mg | ORAL_TABLET | Freq: Two times a day (BID) | ORAL | 0 refills | Status: AC

## 2020-08-03 MED ORDER — MUPIROCIN 2 % EX OINT: 1.0000 "application " | TOPICAL_OINTMENT | Freq: Two times a day (BID) | CUTANEOUS | 0 refills | Status: DC

## 2020-08-03 NOTE — Addendum Note (Signed)
Addended by: [place] Callas on: 08/03/2020 02:41 PM   Modules accepted: Orders

## 2020-08-03 NOTE — Addendum Note (Signed)
Addended by: [place] Callas on: 08/03/2020 12:38 PM   Modules accepted: Orders

## 2020-08-12 DIAGNOSIS — R6889 Other general symptoms and signs: Secondary | ICD-10-CM | POA: Diagnosis not present

## 2020-08-24 ENCOUNTER — Other Ambulatory Visit: Payer: Self-pay | Admitting: Internal Medicine

## 2020-08-24 ENCOUNTER — Other Ambulatory Visit: Payer: Self-pay

## 2020-08-24 DIAGNOSIS — J302 Other seasonal allergic rhinitis: Secondary | ICD-10-CM

## 2020-08-24 NOTE — Telephone Encounter (Signed)
VENTOLIN HFA 108 (90 Base) MCG/ACT inhaler  gabapentin (NEURONTIN) 400 MG capsule, REFILL REQUEST @  [id number] [address] [place], [place] Phone:  [phone]  Fax:  [phone]

## 2020-08-24 NOTE — Telephone Encounter (Signed)
Patient should have available refills on both meds (Gabapentin sent via [facility] on 06/09/20 63month supply w/ 1 rf and Ventolin sent 08/02/20 w/ 1RF). Pt informed. SChaplin, RN,BSN

## 2020-09-01 ENCOUNTER — Encounter: Payer: Self-pay | Admitting: Physical Medicine and Rehabilitation

## 2020-09-01 ENCOUNTER — Encounter (HOSPITAL_COMMUNITY): Payer: Self-pay | Admitting: Emergency Medicine

## 2020-09-01 ENCOUNTER — Emergency Department (HOSPITAL_COMMUNITY)
Admission: EM | Admit: 2020-09-01 | Discharge: 2020-09-01 | Disposition: A | Discharge status: Home / Self Care | Payer: Medicare HMO | Attending: Emergency Medicine | Admitting: Emergency Medicine

## 2020-09-01 ENCOUNTER — Encounter: Payer: Medicare HMO | Attending: Physical Medicine and Rehabilitation | Admitting: Physical Medicine and Rehabilitation

## 2020-09-01 ENCOUNTER — Other Ambulatory Visit: Payer: Self-pay

## 2020-09-01 VITALS — BP 147/96 | HR 83 | Temp 98.2°F | Ht 68.0 in | Wt 160.0 lb

## 2020-09-01 DIAGNOSIS — I2510 Atherosclerotic heart disease of native coronary artery without angina pectoris: Secondary | ICD-10-CM | POA: Insufficient documentation

## 2020-09-01 DIAGNOSIS — S80211A Abrasion, right knee, initial encounter: Secondary | ICD-10-CM | POA: Diagnosis not present

## 2020-09-01 DIAGNOSIS — F17210 Nicotine dependence, cigarettes, uncomplicated: Secondary | ICD-10-CM | POA: Diagnosis not present

## 2020-09-01 DIAGNOSIS — S8991XA Unspecified injury of right lower leg, initial encounter: Secondary | ICD-10-CM | POA: Diagnosis present

## 2020-09-01 DIAGNOSIS — M4802 Spinal stenosis, cervical region: Secondary | ICD-10-CM

## 2020-09-01 DIAGNOSIS — G8929 Other chronic pain: Secondary | ICD-10-CM | POA: Diagnosis not present

## 2020-09-01 DIAGNOSIS — M7918 Myalgia, other site: Secondary | ICD-10-CM | POA: Diagnosis not present

## 2020-09-01 DIAGNOSIS — G4701 Insomnia due to medical condition: Secondary | ICD-10-CM

## 2020-09-01 DIAGNOSIS — Z86011 Personal history of benign neoplasm of the brain: Secondary | ICD-10-CM | POA: Insufficient documentation

## 2020-09-01 DIAGNOSIS — M545 Low back pain, unspecified: Secondary | ICD-10-CM

## 2020-09-01 DIAGNOSIS — J449 Chronic obstructive pulmonary disease, unspecified: Secondary | ICD-10-CM | POA: Diagnosis not present

## 2020-09-01 DIAGNOSIS — W2209XA Striking against other stationary object, initial encounter: Secondary | ICD-10-CM | POA: Insufficient documentation

## 2020-09-01 MED ORDER — CYCLOBENZAPRINE HCL 10 MG PO TABS: 10.0000 mg | ORAL_TABLET | Freq: Every day | ORAL | 0 refills | Status: DC | PRN

## 2020-09-01 MED ORDER — SULFAMETHOXAZOLE-TRIMETHOPRIM 800-160 MG PO TABS: 1.0000 | ORAL_TABLET | Freq: Two times a day (BID) | ORAL | 0 refills | Status: AC

## 2020-09-01 NOTE — Patient Instructions (Signed)
Foods that relieve pain:   1) Ginger 2) Blueberries 3) Salmon 4) Pumpkin seeds 5) dark chocolate 6) turmeric 7) tart cherries 8) virgin olive oil 9) chilli peppers 10) mint 11) red wine

## 2020-09-01 NOTE — ED Provider Notes (Signed)
[facility] EMERG[facility] DEPARTMENT Provider Note   CSN: [id number] Arrival date & time: 09/01/20  1050     History Chief Complaint  Patient presents with  . Knee Pain    [facility] is a 44 y.o. male presents to ED for evaluation of right knee pain. Onset 3 days ago. States he almost fell and scraped his knee. Has wound there. Drained a little at home but wants to make sure all the pus is out. Pain with bending, palpation. No fever, chills. No IVDU. No calf pain or swelling.   HPI     Past Medical History:  Diagnosis Date  . Anal fistula   . Bell's palsy   . Benign neoplasm of meninges ([place])    gamma knife 05/28/15  . CAD (coronary artery disease)   . Chronic pain   . COPD (chronic obstructive pulmonary disease) ([place])   . Headache   . HIV (human immunodeficiency virus infection) ([place]) 12/27/2002    Patient Active Problem List   Diagnosis Date Noted  . Seasonal allergies 03/31/2020  . Esophageal dysphagia 03/31/2020  . Abdominal pain 10/16/2019  . Anxiety 01/23/2019  . Anal fistula 10/22/2018  . Healthcare maintenance 07/30/2018  . Amphetamine and psychostimulant-induced psychosis with delusions ([place]) 05/21/2018  . Substance induced mood disorder ([place]) 01/24/2018  . Abnormal liver function 11/23/2017  . Chronic pain   . HIV (human immunodeficiency virus infection) ([place]) 11/20/2017  . Rheumatoid arthritis ([place]) 11/20/2017  . Fibromyalgia 11/20/2017  . History of syphilis 01/14/2015    Past Surgical History:  Procedure Laterality Date  . CARPAL TUNNEL RELEASE    . CHOLECYSTECTOMY    . COLONOSCOPY  2015   patient reported normal   . Stereotactic destruction of lesion using gamma radiation  05/28/2015   right temporal lobe   . TONSILLECTOMY         Family History  Problem Relation Age of Onset  . Breast cancer Maternal Grandmother   . Cancer Maternal Aunt     Social History   Tobacco Use  . Smoking status: Current Every Day Smoker     Types: Cigarettes  . Smokeless tobacco: Never Used  . Tobacco comment: 5-6 cigs per day  Substance Use Topics  . Alcohol use: Not Currently  . Drug use: Yes    Types: Marijuana    Home Medications Prior to Admission medications   Medication Sig Start Date End Date Taking? Authorizing Provider  bictegravir-emtricitabine-tenofovir AF (BIKTARVY) 50-200-25 MG TABS tablet Take 1 tablet by mouth daily. Try to take at the same time each day with or without food. 10/20/19   Crystal Callas, NP  cholestyramine (QUESTRAN) 4 g packet Take 1 packet (4 g total) by mouth daily. 10/16/19   Asencion Noble, MD  cyclobenzaprine (FLEXERIL) 10 MG tablet Take 1 tablet (10 mg total) by mouth daily as needed for muscle spasms. 09/01/20   Raulkar, Clide Deutscher, MD  fluticasone (FLONASE) 50 MCG/ACT nasal spray PLACE 1 SPRAY INTO BOTH NOSTRILS DAILY. 09/01/20   Iona Beard, MD  gabapentin (NEURONTIN) 400 MG capsule TAKE 1 CAPSULE BY MOUTH 3 TIMES DAILY. 06/09/20   [place] Callas, NP  ibuprofen (ADVIL,MOTRIN) 200 MG tablet Take 600 mg by mouth every 6 (six) hours as needed for moderate pain.    [provider]  montelukast (SINGULAIR) 10 MG tablet TAKE 1 TABLET BY MOUTH EVERYDAY AT BEDTIME 04/13/20   [place] Callas, NP  sulfamethoxazole-trimethoprim (BACTRIM DS) 800-160 MG tablet Take  1 tablet by mouth 2 (two) times daily for 7 days. 09/01/20 09/08/20  Kinnie Feil, PA-C  traZODone (DESYREL) 100 MG tablet TAKE 1 TABLET BY MOUTH EVERYDAY AT BEDTIME 06/09/20   [place], Carley D, DO  VENTOLIN HFA 108 (90 Base) MCG/ACT inhaler INHALE 1-2 PUFFS INTO THE LUNGS EVERY 6 HOURS AS NEEDED FOR WHEEZING OR SHORTNESS OF BREATH 08/02/20   Mitzi [place], MD  gabapentin (NEURONTIN) 400 MG capsule Take 1 capsule (400 mg total) by mouth 3 (three) times daily. 12/10/19   [place] Callas, NP    Allergies    Keflex [cephalexin]  Review of Systems   Review of Systems  Musculoskeletal: Positive for  arthralgias.  Skin: Positive for wound.  All other systems reviewed and are negative.   Physical Exam Updated Vital Signs BP (!) 140/95 (BP Location: Right Arm)   Pulse 84   Temp 98.3 F (36.8 C) (Oral)   Resp 14   SpO2 100%   Physical Exam Constitutional:      Appearance: He is well-developed.  HENT:     Head: Normocephalic.     Nose: Nose normal.  Eyes:     General: Lids are normal.  Cardiovascular:     Rate and Rhythm: Normal rate.  Pulmonary:     Effort: Pulmonary effort is normal. No respiratory distress.  Musculoskeletal:        General: Normal range of motion.     Cervical back: Normal range of motion.  Skin:    Comments: Small superficial ulcerated wound over right patella with minimal circumferential erythema and mild edema. No fluctuance. No drainage, bleeding. Minimal warmth. Full ROM of knee without pain, pain with palpation only. Normal calf without edema or tenderness   Neurological:     Mental Status: He is alert.  Psychiatric:        Behavior: Behavior normal.     ED Results / Procedures / Treatments   Labs (all labs ordered are listed, but only abnormal results are displayed) Labs Reviewed - No data to display  EKG None  Radiology No results found.  Procedures Procedures (including critical care time)  Medications Ordered in ED Medications - No data to display  ED Course  I have reviewed the triage vital signs and the nursing notes.  Pertinent labs & imaging results that were available during my care of the patient were reviewed by me and considered in my medical decision making (see chart for details).    MDM Rules/Calculators/A&P                          Posttraumatic right knee pain with wound. Appears mostly inflamed and not significantly infected. No findings on exam to suggest abscess, severe cellulitis, gout, septic arthritis, sepsis. Doubt DVT in this setting. Will dc with warm compresses, antibiotic, NSAID. Return precautions  given.  Final Clinical Impression(s) / ED Diagnoses Final diagnoses:  Abrasion of right knee, initial encounter    Rx / DC Orders ED Discharge Orders         Ordered    sulfamethoxazole-trimethoprim (BACTRIM DS) 800-160 MG tablet  2 times daily        09/01/20 1105           Arlean Hopping 09/01/20 1147    Isla Pence, MD 09/02/20 [phone]

## 2020-09-01 NOTE — Progress Notes (Signed)
Subjective:    Patient ID: Jonathan Rangel, male    DOB: 23-Sep-1976, 44 y.o.   MRN: [id number]  HPI  Jonathan Rangel is a 44 year old man who presents to establish care for cervical spine pain. He has a history of fibromyalgia, bilateral carpal tunnel syndrome, and rheumatoid arthritis.   Average pain is 10/10.   He has very tight cervical muscles- this is currently where his pain was worst. He used to get trigger point injections with excellent relief. He thinks he may have been getting it with steroids. He used to get this done in [place]. He used to take Hydrocodone 36m, muscle relaxer, 317mof morphine (which he did not like). He does not like medications because he likes to stay focused. The hydrocodone used to work very well for him. He is currently on Gabapentin and this makes him shaky. He was taking it twice per day. He tried Lyrica and this caused swelling.   Pain Inventory Average Pain 10 Pain Right Now 10 My pain is sharp, burning, dull, stabbing, tingling and aching  In the last 24 hours, has pain interfered with the following? General activity 7 Relation with others 5 Enjoyment of life 10 What TIME of day is your pain at its worst? morning  and daytime Sleep (in general) Fair  Pain is worse with: walking, bending, sitting, standing and some activites Pain improves with: rest, heat/ice, medication, TENS and injections Relief from Meds: 5  walk without assistance how many minutes can you walk? 20-30 ability to climb steps?  no do you drive?  no  not employed: date last employed 12/2002  bladder control problems bowel control problems weakness numbness tingling trouble walking spasms anxiety  New pt  New pt    Family History  Problem Relation Age of Onset  . Breast cancer Maternal Grandmother   . Cancer Maternal Aunt    Social History   Socioeconomic History  . Marital status: Single    Spouse name: Not on file  . Number of children: Not on file  .  Years of education: Not on file  . Highest education level: Not on file  Occupational History  . Not on file  Tobacco Use  . Smoking status: Current Every Day Smoker    Types: Cigarettes  . Smokeless tobacco: Never Used  . Tobacco comment: 5-6 cigs per day  Substance and Sexual Activity  . Alcohol use: Not Currently  . Drug use: Yes    Types: Marijuana  . Sexual activity: Not on file  Other Topics Concern  . Not on file  Social History Narrative  . Not on file   Social Determinants of Health   Financial Resource Strain:   . Difficulty of Paying Living Expenses: Not on file  Food Insecurity:   . Worried About RuCharity fundraisern the Last Year: Not on file  . Ran Out of Food in the Last Year: Not on file  Transportation Needs:   . Lack of Transportation (Medical): Not on file  . Lack of Transportation (Non-Medical): Not on file  Physical Activity:   . Days of Exercise per Week: Not on file  . Minutes of Exercise per Session: Not on file  Stress:   . Feeling of Stress : Not on file  Social Connections:   . Frequency of Communication with Friends and Family: Not on file  . Frequency of Social Gatherings with Friends and Family: Not on file  . Attends Religious Services:  Not on file  . Active Member of Clubs or Organizations: Not on file  . Attends Archivist Meetings: Not on file  . Marital Status: Not on file   Past Surgical History:  Procedure Laterality Date  . CARPAL TUNNEL RELEASE    . CHOLECYSTECTOMY    . COLONOSCOPY  2015   patient reported normal   . Stereotactic destruction of lesion using gamma radiation  05/28/2015   right temporal lobe   . TONSILLECTOMY     Past Medical History:  Diagnosis Date  . Anal fistula   . Bell's palsy   . Benign neoplasm of meninges ([place])    gamma knife 05/28/15  . CAD (coronary artery disease)   . Chronic pain   . COPD (chronic obstructive pulmonary disease) ([place])   . Headache   . HIV (human immunodeficiency  virus infection) ([place]) 12/27/2002   BP (!) 147/96   Pulse 83   Temp 98.2 F (36.8 C)   Ht '5\' 8"'  (1.727 m)   Wt 160 lb (72.6 kg)   SpO2 99%   BMI 24.33 kg/m   Opioid Risk Score:   Fall Risk Score:  `1  Depression screen PHQ 2/9  Depression screen [facility] 2/9 04/14/2020 12/31/2019 04/10/2019 01/22/2019 10/22/2018  Decreased Interest 2 0 1 0 0  Down, Depressed, Hopeless 0 2 1 0 0  PHQ - 2 Score '2 2 2 ' 0 0  Altered sleeping 2 0 1 0 0  Tired, decreased energy '3 2 1 ' 0 0  Change in appetite 2 0 1 0 0  Feeling bad or failure about yourself  0 0 1 0 0  Trouble concentrating 0 2 1 0 0  Moving slowly or fidgety/restless '2 2 1 ' 0 0  Suicidal thoughts 0 0 1 0 0  PHQ-9 Score '11 8 9 ' 0 0  Difficult doing work/chores Somewhat difficult (No Data) - - Not difficult at all   Review of Systems  Constitutional: Positive for appetite change, diaphoresis and unexpected weight change.  Respiratory: Positive for cough and shortness of breath.   Gastrointestinal: Positive for abdominal pain, constipation and nausea.  Skin: Positive for rash.       spasms  Neurological: Positive for weakness and numbness.       Tingling  Psychiatric/Behavioral: The patient is nervous/anxious.        Objective:   Physical Exam Gen: no distress, normal appearing HEENT: oral mucosa pink and moist, NCAT Cardio: Reg rate Chest: normal effort, normal rate of breathing Abd: soft, non-distended Ext: no edema Skin: intact Neuro: Alert and oriented x3.  Musculoskeletal: Tight cervical mucles bilaterally.  Psych: pleasant, normal affect    Assessment & Plan:  1) Chronic Pain Syndrome secondary to fibromyalgia, cervical stenosis.  -Has has tried Savella, Cymbalta, Gabapentin, Lyrica.  -Discussed current symptoms of pain and history of pain.  -Discussed benefits of exercise in reducing pain. -Discussed following foods that may reduce pain: 1) Ginger 2) Blueberries 3) Salmon 4) Pumpkin seeds 5) dark chocolate 6)  turmeric 7) tart cherries 8) virgin olive oil 9) chilli peppers 10) mint 11) red wine -Marijuana helps his pain.  -He had good benefit from epidural steroid injection last visit.  -He does yoga and Tai Chi- continue this.   2) Cervical Myofascial Syndrome:  -Trigger point injections next visit.  -Provided script for Flexeril.   3) Insomnia: -Takes Trazodone. Has tried Amitriptyline 40m without benefit.   4) Low back pain:  -Trigger point injections next visit.  -  XR ordered.   60 minutes spent in reviewing patient's chart, discussing diffuse pain complaints, discussed marijuana use, discussed pain relieving foods, discussed his exercise regimen, prescribed flexeril prn, plan for trigger point injections next visit (an the mechanism of how this works), insomnia and medications he has tried for it (trazodone helps, not Amitriptyline), reviewed cervical spine imaging and explaining results to patients.

## 2020-09-01 NOTE — Discharge Instructions (Addendum)
You have a small wound on your knee with some redness.  This is probably inflammation or probably an early infection.  Take antibiotic.  Take 500 to 1000 mg of Tylenol every 6-8 hours for pain.  Apply warm compresses and massage under hot water.  Symptoms should improve after 48 or 72 hours of antibiotics.  Return to the ED for worsening swelling redness warmth pus fevers greater than 100

## 2020-09-01 NOTE — ED Triage Notes (Signed)
Pt arrives to ED via pov with c/o of sore on right knee for 3 days, sore occurred after hitting his knee on the corner of a wall.

## 2020-09-02 ENCOUNTER — Other Ambulatory Visit: Payer: Self-pay

## 2020-09-02 ENCOUNTER — Encounter (HOSPITAL_COMMUNITY): Payer: Self-pay

## 2020-09-02 ENCOUNTER — Emergency Department (HOSPITAL_COMMUNITY)
Admission: EM | Admit: 2020-09-02 | Discharge: 2020-09-02 | Disposition: A | Discharge status: Home / Self Care | Payer: Medicare HMO | Attending: Emergency Medicine | Admitting: Emergency Medicine

## 2020-09-02 ENCOUNTER — Emergency Department (HOSPITAL_COMMUNITY): Payer: Medicare HMO

## 2020-09-02 DIAGNOSIS — W1839XA Other fall on same level, initial encounter: Secondary | ICD-10-CM | POA: Insufficient documentation

## 2020-09-02 DIAGNOSIS — J449 Chronic obstructive pulmonary disease, unspecified: Secondary | ICD-10-CM | POA: Insufficient documentation

## 2020-09-02 DIAGNOSIS — M7989 Other specified soft tissue disorders: Secondary | ICD-10-CM | POA: Diagnosis not present

## 2020-09-02 DIAGNOSIS — S80211A Abrasion, right knee, initial encounter: Secondary | ICD-10-CM | POA: Diagnosis not present

## 2020-09-02 DIAGNOSIS — Z79899 Other long term (current) drug therapy: Secondary | ICD-10-CM | POA: Diagnosis not present

## 2020-09-02 DIAGNOSIS — I2510 Atherosclerotic heart disease of native coronary artery without angina pectoris: Secondary | ICD-10-CM | POA: Diagnosis not present

## 2020-09-02 DIAGNOSIS — M25461 Effusion, right knee: Secondary | ICD-10-CM | POA: Diagnosis not present

## 2020-09-02 DIAGNOSIS — L03115 Cellulitis of right lower limb: Secondary | ICD-10-CM | POA: Diagnosis not present

## 2020-09-02 DIAGNOSIS — M25469 Effusion, unspecified knee: Secondary | ICD-10-CM

## 2020-09-02 DIAGNOSIS — D72829 Elevated white blood cell count, unspecified: Secondary | ICD-10-CM | POA: Insufficient documentation

## 2020-09-02 DIAGNOSIS — F17210 Nicotine dependence, cigarettes, uncomplicated: Secondary | ICD-10-CM | POA: Diagnosis not present

## 2020-09-02 LAB — COMPREHENSIVE METABOLIC PANEL
ALT: 13 U/L (ref 0–44)
AST: 15 U/L (ref 15–41)
Albumin: 3.5 g/dL (ref 3.5–5.0)
Alkaline Phosphatase: 60 U/L (ref 38–126)
Anion gap: 7 (ref 5–15)
BUN: 13 mg/dL (ref 6–20)
CO2: 27 mmol/L (ref 22–32)
Calcium: 8.8 mg/dL — ABNORMAL LOW (ref 8.9–10.3)
Chloride: 106 mmol/L (ref 98–111)
Creatinine, Ser: 1.09 mg/dL (ref 0.61–1.24)
GFR, Estimated: >60 mL/min (ref >60)
Glucose, Bld: 94 mg/dL (ref 70–99)
Potassium: 3.9 mmol/L (ref 3.5–5.1)
Sodium: 140 mmol/L (ref 135–145)
Total Bilirubin: 0.4 mg/dL (ref 0.3–1.2)
Total Protein: 7.1 g/dL (ref 6.5–8.1)

## 2020-09-02 LAB — CBC WITH DIFFERENTIAL/PLATELET
Abs Immature Granulocytes: 0.05 10*3/uL (ref 0.00–0.07)
Basophils Absolute: 0.1 10*3/uL (ref 0.0–0.1)
Basophils Relative: 0 %
Eosinophils Absolute: 0.2 10*3/uL (ref 0.0–0.5)
Eosinophils Relative: 2 %
HCT: 40.4 % (ref 39.0–52.0)
Hemoglobin: 13.2 g/dL (ref 13.0–17.0)
Immature Granulocytes: 0 %
Lymphocytes Relative: 22 %
Lymphs Abs: 2.7 10*3/uL (ref 0.7–4.0)
MCH: 31.8 pg (ref 26.0–34.0)
MCHC: 32.7 g/dL (ref 30.0–36.0)
MCV: 97.3 fL (ref 80.0–100.0)
Monocytes Absolute: 1.2 10*3/uL — ABNORMAL HIGH (ref 0.1–1.0)
Monocytes Relative: 10 %
Neutro Abs: 7.9 10*3/uL — ABNORMAL HIGH (ref 1.7–7.7)
Neutrophils Relative %: 66 %
Platelets: 258 10*3/uL (ref 150–400)
RBC: 4.15 MIL/uL — ABNORMAL LOW (ref 4.22–5.81)
RDW: 14 % (ref 11.5–15.5)
WBC: 12.1 10*3/uL — ABNORMAL HIGH (ref 4.0–10.5)
nRBC: 0 % (ref 0.0–0.2)

## 2020-09-02 LAB — SYNOVIAL CELL COUNT + DIFF, W/ CRYSTALS
Crystals, Fluid: NONE SEEN
Eosinophils-Synovial: 0 % (ref 0–1)
Lymphocytes-Synovial Fld: 28 % — ABNORMAL HIGH (ref 0–20)
Monocyte-Macrophage-Synovial Fluid: 33 % — ABNORMAL LOW (ref 50–90)
Neutrophil, Synovial: 39 % — ABNORMAL HIGH (ref 0–25)
WBC, Synovial: 33 /mm3 (ref 0–200)

## 2020-09-02 MED ORDER — ONDANSETRON 4 MG PO TBDP: 4.0000 mg | ORAL_TABLET | Freq: Once | ORAL | Status: DC

## 2020-09-02 MED ORDER — ONDANSETRON HCL 4 MG PO TABS: 4.0000 mg | ORAL_TABLET | Freq: Three times a day (TID) | ORAL | 0 refills | Status: DC | PRN

## 2020-09-02 MED ORDER — HYDROCODONE-ACETAMINOPHEN 5-325 MG PO TABS: 1.0000 | ORAL_TABLET | Freq: Once | ORAL | Status: DC

## 2020-09-02 MED ORDER — HYDROCODONE-ACETAMINOPHEN 5-325 MG PO TABS: 1.0000 | ORAL_TABLET | Freq: Four times a day (QID) | ORAL | 0 refills | Status: DC | PRN

## 2020-09-02 MED ORDER — HYDROMORPHONE HCL 1 MG/ML IJ SOLN
1.0000 mg | Freq: Once | INTRAMUSCULAR | Status: AC
  Administered 2020-09-02: 1 mg via INTRAVENOUS
  Filled 2020-09-02: qty 1

## 2020-09-02 MED ORDER — LIDOCAINE-EPINEPHRINE 1 %-1:100000 IJ SOLN
10.0000 mL | Freq: Once | INTRAMUSCULAR | Status: AC
  Administered 2020-09-02: 10 mL via INTRADERMAL
  Filled 2020-09-02: qty 1

## 2020-09-02 MED ORDER — DOXYCYCLINE HYCLATE 100 MG PO CAPS: 100.0000 mg | ORAL_CAPSULE | Freq: Two times a day (BID) | ORAL | 0 refills | Status: DC

## 2020-09-02 NOTE — ED Triage Notes (Signed)
Patient complains of ongoing right knee pain with swelling and increased pain. Taking antibiotics as prescribed.

## 2020-09-02 NOTE — Discharge Instructions (Addendum)

## 2020-09-02 NOTE — ED Notes (Signed)
Pt provided with ice chips and ginger ale as requested.

## 2020-09-02 NOTE — ED Provider Notes (Signed)
[facility] EMERGENCY DEPARTMENT Provider Note   CSN: [id number] Arrival date & time: 09/02/20  1129     History No chief complaint on file.   Jonathan Rangel is a 44 y.o. male with PMH of HIV on Biktarvy, CAD, and COPD who presents to the ED with complaints of right knee pain and swelling.  I reviewed patient's medical record and he was evaluated in the ER yesterday for a right knee abrasion.  Patient denied any systemic symptoms at home and his physical exam was reassuring.  Mild circumferential warmth, erythema, and edema is noted.  However, ROM fully intact.  He was ultimately discharged home with Bactrim antibiotics and over-the-counter analgesics.  On my examination, patient reports that he began approximately 1 week ago when he fell causing an abrasion to his right knee.  A few days ago, he was picking at the scab which caused mild bleeding and irritation.  He has since noticed progressive swelling, erythema, warmth, and discomfort.  Patient also described milky drainage this morning.  He is still ambulatory.  He is still to flex and extend his knee.  However, he states that he has had significant progression of his swelling since he was evaluated here in the ER yesterday.  He denies any symptoms of systemic illness.  No fevers or chills.  No generalized fatigue.  He denies any history of gout or gouty arthritis.  He states that his CD4 count is relatively well controlled on his Biktarvy.  He denies any numbness, weakness, or other symptoms.  He is concerned for joint infection.  HPI     Past Medical History:  Diagnosis Date  . Anal fistula   . Bell's palsy   . Benign neoplasm of meninges ([place])    gamma knife 05/28/15  . CAD (coronary artery disease)   . Chronic pain   . COPD (chronic obstructive pulmonary disease) ([place])   . Headache   . HIV (human immunodeficiency virus infection) ([place]) 12/27/2002    Patient Active Problem List   Diagnosis Date Noted  .  Seasonal allergies 03/31/2020  . Esophageal dysphagia 03/31/2020  . Abdominal pain 10/16/2019  . Anxiety 01/23/2019  . Anal fistula 10/22/2018  . Healthcare maintenance 07/30/2018  . Amphetamine and psychostimulant-induced psychosis with delusions ([place]) 05/21/2018  . Substance induced mood disorder ([place]) 01/24/2018  . Abnormal liver function 11/23/2017  . Chronic pain   . HIV (human immunodeficiency virus infection) ([place]) 11/20/2017  . Rheumatoid arthritis ([place]) 11/20/2017  . Fibromyalgia 11/20/2017  . History of syphilis 01/14/2015    Past Surgical History:  Procedure Laterality Date  . CARPAL TUNNEL RELEASE    . CHOLECYSTECTOMY    . COLONOSCOPY  2015   patient reported normal   . Stereotactic destruction of lesion using gamma radiation  05/28/2015   right temporal lobe   . TONSILLECTOMY         Family History  Problem Relation Age of Onset  . Breast cancer Maternal Grandmother   . Cancer Maternal Aunt     Social History   Tobacco Use  . Smoking status: Current Every Day Smoker    Types: Cigarettes  . Smokeless tobacco: Never Used  . Tobacco comment: 5-6 cigs per day  Substance Use Topics  . Alcohol use: Not Currently  . Drug use: Yes    Types: Marijuana    Home Medications Prior to Admission medications   Medication Sig Start Date End Date Taking? Authorizing Provider  bictegravir-emtricitabine-tenofovir AF (BIKTARVY) [phone]  MG TABS tablet Take 1 tablet by mouth daily. Try to take at the same time each day with or without food. 10/20/19   Talala Callas, NP  cholestyramine (QUESTRAN) 4 g packet Take 1 packet (4 g total) by mouth daily. 10/16/19   Asencion Noble, MD  cyclobenzaprine (FLEXERIL) 10 MG tablet Take 1 tablet (10 mg total) by mouth daily as needed for muscle spasms. 09/01/20   Raulkar, Clide Deutscher, MD  fluticasone (FLONASE) 50 MCG/ACT nasal spray PLACE 1 SPRAY INTO BOTH NOSTRILS DAILY. 09/01/20   Iona Beard, MD  gabapentin (NEURONTIN) 400  MG capsule TAKE 1 CAPSULE BY MOUTH 3 TIMES DAILY. 06/09/20   [place] Callas, NP  ibuprofen (ADVIL,MOTRIN) 200 MG tablet Take 600 mg by mouth every 6 (six) hours as needed for moderate pain.    [provider]  montelukast (SINGULAIR) 10 MG tablet TAKE 1 TABLET BY MOUTH EVERYDAY AT BEDTIME 04/13/20   Dixon, Melton Krebs, NP  sulfamethoxazole-trimethoprim (BACTRIM DS) 800-160 MG tablet Take 1 tablet by mouth 2 (two) times daily for 7 days. 09/01/20 09/08/20  Kinnie Feil, PA-C  traZODone (DESYREL) 100 MG tablet TAKE 1 TABLET BY MOUTH EVERYDAY AT BEDTIME 06/09/20   [place], Carley D, DO  VENTOLIN HFA 108 (90 Base) MCG/ACT inhaler INHALE 1-2 PUFFS INTO THE LUNGS EVERY 6 HOURS AS NEEDED FOR WHEEZING OR SHORTNESS OF BREATH 08/02/20   Mitzi Hansen, MD  gabapentin (NEURONTIN) 400 MG capsule Take 1 capsule (400 mg total) by mouth 3 (three) times daily. 12/10/19   [place] Callas, NP    Allergies    Keflex [cephalexin]  Review of Systems   Review of Systems  Constitutional: Negative for chills and fever.  Musculoskeletal: Positive for arthralgias.  Skin: Positive for wound.  Neurological: Negative for weakness and numbness.  Hematological: Does not bruise/bleed easily.    Physical Exam Updated Vital Signs BP 126/69   Pulse 97   Temp 98.9 F (37.2 C) (Oral)   Resp 16   SpO2 98%   Physical Exam Vitals and nursing note reviewed. Exam conducted with a chaperone present.  Constitutional:      Appearance: Normal appearance.  HENT:     Head: Normocephalic and atraumatic.  Eyes:     General: No scleral icterus.    Conjunctiva/sclera: Conjunctivae normal.  Cardiovascular:     Rate and Rhythm: Normal rate.     Pulses: Normal pulses.  Pulmonary:     Effort: Pulmonary effort is normal.  Musculoskeletal:     Comments: Right knee: Moderate swelling and effusion noted.  Abrasion to patellar area with mild bloody/purulent drainage.  ROM largely intact, only mildly limited -  likely due to swelling.  No overlying induration, but erythema and mild warmth appreciated.  Skin:    General: Skin is dry.     Capillary Refill: Capillary refill takes less than 2 seconds.  Neurological:     General: No focal deficit present.     Mental Status: He is alert and oriented to person, place, and time.     GCS: GCS eye subscore is 4. GCS verbal subscore is 5. GCS motor subscore is 6.     Cranial Nerves: No cranial nerve deficit.     Sensory: No sensory deficit.     Motor: No weakness.     Coordination: Coordination normal.     Comments: Mildly antalgic gait.  Psychiatric:        Mood and Affect: Mood normal.  Behavior: Behavior normal.        Thought Content: Thought content normal.     ED Results / Procedures / Treatments   Labs (all labs ordered are listed, but only abnormal results are displayed) Labs Reviewed  CBC WITH DIFFERENTIAL/PLATELET - Abnormal; Notable for the following components:      Result Value   WBC 12.1 (*)    RBC 4.15 (*)    Neutro Abs 7.9 (*)    Monocytes Absolute 1.2 (*)    All other components within normal limits  COMPREHENSIVE METABOLIC PANEL - Abnormal; Notable for the following components:   Calcium 8.8 (*)    All other components within normal limits  BODY FLUID CULTURE  GRAM STAIN  SYNOVIAL CELL COUNT + DIFF, W/ CRYSTALS    EKG None  Radiology DG Knee Complete 4 Views Right  Result Date: 09/02/2020 CLINICAL DATA:  Struck RIGHT knee against headboard a bed last week, increasing swelling since, open sore at anterior RIGHT knee, warm to touch, EXAM: RIGHT KNEE - COMPLETE 4+ VIEW COMPARISON:  None FINDINGS: Regional soft tissue swelling medially and anteriorly. Osseous mineralization normal. Joint spaces preserved. No fracture, dislocation, or bone destruction. No joint effusion. IMPRESSION: Soft tissue swelling without osseous abnormality. Electronically Signed   By: Lavonia Dana M.D.   On: 09/02/2020 13:03     Procedures Procedures (including critical care time)  Medications Ordered in ED Medications  lidocaine-EPINEPHrine (XYLOCAINE W/EPI) 1 %-1:100000 (with pres) injection 10 mL (has no administration in time range)  HYDROmorphone (DILAUDID) injection 1 mg (1 mg Intravenous Given 09/02/20 1445)    ED Course  I have reviewed the triage vital signs and the nursing notes.  Pertinent labs & imaging results that were available during my care of the patient were reviewed by me and considered in my medical decision making (see chart for details).  Clinical Course as of Sep 02 1556  Thu Sep 02, 2020  1506 44 yo male presenting to the ED with right knee pain and swelling.  He fell onto the knee 1 week ago, was recovering well, then "picked" at the knee scab 1-2 days ago, and now has worsening swelling of the knee.  He is afebrile here, has some warmth and effusion of the right knee, but has excellent ROM (passive and active), and is neurovascularly intact.  He has a small region of purulent drainage from a skin tag overlying the patella.  He reports a hx of MRSA infections in the past.  We consented him verbally for an arthrocentesis and will perform this at bedside.  If the synovial workup is negative for infection, he can be discharge home.   [MT]    Clinical Course User Index [MT] Wyvonnia Dusky, MD   MDM Rules/Calculators/A&P                          Given patient's progressively worsening pain, swelling, erythema, and warmth, suspicion for septic arthritis.  His range of motion was largely reassuring however and plain films were personally reviewed and without evidence of effusion.  While there was no significant induration on exam, suspect septic arthritis versus cellulitis.  Patient is already taking Bactrim given his history of MRSA colonization.  Patient states that he is followed by Dr. Frankey Shown.   Dr. Langston Masker personally evaluated patient at bedside and attempted to tap joint to  evaluate for septic arthritis.  He was only able to obtain approximately 3  to 5 cc synovial fluid.    While he does have a mild leukocytosis, he denies any fevers or chills.  Vital signs are stable and within normal limits.  Suspect he can be discharged home with doxycycline in addition to his Bactrim for double coverage given his hx of MRSA.  If the arthrocentesis findings are suggestive of septic arthritis, he will need to go to the OR for washout.     At shift change care was transferred to Margarita Mail, PA-C who will follow pending studies, re-evaluate, and determine disposition.     Final Clinical Impression(s) / ED Diagnoses Final diagnoses:  Knee swelling    Rx / DC Orders ED Discharge Orders    None       Corena Herter, PA-C 09/02/20 1557    Wyvonnia Dusky, MD 09/02/20 1725

## 2020-09-02 NOTE — ED Notes (Signed)
Crutches given to pt. This RN ensured that pt understands how to properly use crutches for walking

## 2020-09-02 NOTE — ED Provider Notes (Signed)
Patient taken at shift change from PA [place].  Here with complaint of knee pain.  He had a recent injury and then picked a scab off of his knee now his knee is hot red swollen.  Will await analysis of synovial fluid.   Synovial fluid returned he does not fit criteria for septic joint.  Patient given crutches, doxycycline, pain meds for cellulitis.  I discussed return precautions with the patient.  Appears appropriate for discharge at this time   Jonathan Rangel 09/02/20 2034    Lajean Saver, MD 09/02/20 [phone]

## 2020-09-02 NOTE — ED Notes (Signed)
E-signature pad unavailable at time of pt discharge. This RN discussed discharge materials with pt and answered all pt questions. Pt stated understanding of discharge material. ? ?

## 2020-09-06 ENCOUNTER — Other Ambulatory Visit: Payer: Medicare HMO

## 2020-09-06 LAB — BODY FLUID CULTURE: Culture: NO GROWTH

## 2020-09-07 ENCOUNTER — Other Ambulatory Visit: Payer: Self-pay

## 2020-09-07 ENCOUNTER — Ambulatory Visit (INDEPENDENT_AMBULATORY_CARE_PROVIDER_SITE_OTHER): Payer: Medicare HMO | Admitting: Orthopaedic Surgery

## 2020-09-07 ENCOUNTER — Encounter: Payer: Self-pay | Admitting: Orthopaedic Surgery

## 2020-09-07 VITALS — Ht 68.0 in | Wt 160.0 lb

## 2020-09-07 DIAGNOSIS — L089 Local infection of the skin and subcutaneous tissue, unspecified: Secondary | ICD-10-CM | POA: Diagnosis not present

## 2020-09-07 MED ORDER — DOXYCYCLINE HYCLATE 100 MG PO CAPS: 100.0000 mg | ORAL_CAPSULE | Freq: Two times a day (BID) | ORAL | 0 refills | Status: DC

## 2020-09-07 NOTE — Progress Notes (Signed)
Office Visit Note   Patient: Jonathan Rangel           Date of Birth: Dec 25, 1975           MRN: [id number] Visit Date: 09/07/2020              Requested by: Madalyn Rob, MD [address] [place],  [place] [address] PCP: Madalyn Rob, MD   Assessment & Plan: Visit Diagnoses:  1. Skin infection of right knee     Plan: Impression is resolving superficial infection to the right knee.  At this point, we have refilled the doxycycline which she will continue for another 2 weeks.  He will start wet-to-dry dressing changes twice daily.  He was instructed on this today.  We will also apply compression wrap.  Follow-up with [place] in 2 weeks time for recheck.  He will call with any concerns or questions in the meantime.  Follow-Up Instructions: Return in about 2 weeks (around 09/21/2020).   Orders:  No orders of the defined types were placed in this encounter.  Meds ordered this encounter  Medications  . doxycycline (VIBRAMYCIN) 100 MG capsule    Sig: Take 1 capsule (100 mg total) by mouth 2 (two) times daily.    Dispense:  28 capsule    Refill:  0      Procedures: No procedures performed   Clinical Data: No additional findings.   Subjective: Chief Complaint  Patient presents with  . Right Knee - Pain    HPI patient is a pleasant 44 year old gentleman who comes in today in the ED follow-up.  Approximately 2 weeks ago, he was helping his mom get up in the bed when he hit his right knee on the headboard.  This did cause a superficial abrasion which she picked about a week later causing subsequent drainage.  He was seen in the ED for this.  Right knee arthrocentesis was performed which was negative for infection.  He was placed on doxycycline and comes in today for follow-up.  He notes that his pain has improved.  He is still on the doxycycline.  He still notes slight drainage to the patella.  No fevers or chills.  Review of Systems as detailed in HPI.  All others reviewed and  are negative.   Objective: Vital Signs: Ht 5\' 8"  (1.727 m)   Wt 160 lb (72.6 kg)   BMI 24.33 kg/m   Physical Exam well-developed well-nourished gentleman in no acute distress.  Alert oriented x3.  Ortho Exam right knee exam shows trace effusion.  He does have slight swelling to the prepatellar bursa.  He does have a somewhat superficial abrasion without signs of infection or cellulitis.  He has painless range of motion of the knee from about 0 to 100 degrees.  He is neurovascular intact distally.  The wound appears to be an area that a scab had fallen off from.  There is subcutaneous tissue that is exposed.  No exposed bone.  Specialty Comments:  No specialty comments available.  Imaging: No new imaging   PMFS History: Patient Active Problem List   Diagnosis Date Noted  . Seasonal allergies 03/31/2020  . Esophageal dysphagia 03/31/2020  . Abdominal pain 10/16/2019  . Anxiety 01/23/2019  . Anal fistula 10/22/2018  . Healthcare maintenance 07/30/2018  . Amphetamine and psychostimulant-induced psychosis with delusions ([place]) 05/21/2018  . Substance induced mood disorder ([place]) 01/24/2018  . Abnormal liver function 11/23/2017  . Chronic pain   .  HIV (human immunodeficiency virus infection) ([place]) 11/20/2017  . Rheumatoid arthritis ([place]) 11/20/2017  . Fibromyalgia 11/20/2017  . History of syphilis 01/14/2015   Past Medical History:  Diagnosis Date  . Anal fistula   . Bell's palsy   . Benign neoplasm of meninges ([place])    gamma knife 05/28/15  . CAD (coronary artery disease)   . Chronic pain   . COPD (chronic obstructive pulmonary disease) ([place])   . Headache   . HIV (human immunodeficiency virus infection) ([place]) 12/27/2002    Family History  Problem Relation Age of Onset  . Breast cancer Maternal Grandmother   . Cancer Maternal Aunt     Past Surgical History:  Procedure Laterality Date  . CARPAL TUNNEL RELEASE    . CHOLECYSTECTOMY    . COLONOSCOPY  2015   patient  reported normal   . Stereotactic destruction of lesion using gamma radiation  05/28/2015   right temporal lobe   . TONSILLECTOMY     Social History   Occupational History  . Not on file  Tobacco Use  . Smoking status: Current Every Day Smoker    Types: Cigarettes  . Smokeless tobacco: Never Used  . Tobacco comment: 5-6 cigs per day  Substance and Sexual Activity  . Alcohol use: Not Currently  . Drug use: Yes    Types: Marijuana  . Sexual activity: Not on file

## 2020-09-08 ENCOUNTER — Telehealth: Payer: Self-pay | Admitting: Orthopaedic Surgery

## 2020-09-08 NOTE — Telephone Encounter (Signed)
Patient called requesting a call back. Patient wanted to make sure he is not missing the saline solution the doctor gave him .Please call patient at [phone].

## 2020-09-08 NOTE — Telephone Encounter (Signed)
Called patient . He got all he needed yesterday.

## 2020-09-27 ENCOUNTER — Ambulatory Visit (INDEPENDENT_AMBULATORY_CARE_PROVIDER_SITE_OTHER): Payer: Medicare HMO | Admitting: Infectious Diseases

## 2020-09-27 ENCOUNTER — Other Ambulatory Visit: Payer: Self-pay

## 2020-09-27 ENCOUNTER — Other Ambulatory Visit (HOSPITAL_COMMUNITY)
Admission: RE | Admit: 2020-09-27 | Discharge: 2020-09-27 | Disposition: A | Discharge status: Home / Self Care | Payer: Medicare HMO | Source: Ambulatory Visit | Attending: Infectious Diseases | Admitting: Infectious Diseases

## 2020-09-27 ENCOUNTER — Encounter: Payer: Self-pay | Admitting: Infectious Diseases

## 2020-09-27 VITALS — BP 123/76 | HR 74 | Wt 153.0 lb

## 2020-09-27 DIAGNOSIS — L301 Dyshidrosis [pompholyx]: Secondary | ICD-10-CM

## 2020-09-27 DIAGNOSIS — Z23 Encounter for immunization: Secondary | ICD-10-CM | POA: Diagnosis not present

## 2020-09-27 DIAGNOSIS — Z21 Asymptomatic human immunodeficiency virus [HIV] infection status: Secondary | ICD-10-CM | POA: Diagnosis not present

## 2020-09-27 DIAGNOSIS — Z8619 Personal history of other infectious and parasitic diseases: Secondary | ICD-10-CM | POA: Diagnosis not present

## 2020-09-27 DIAGNOSIS — Z113 Encounter for screening for infections with a predominantly sexual mode of transmission: Secondary | ICD-10-CM

## 2020-09-27 DIAGNOSIS — L282 Other prurigo: Secondary | ICD-10-CM | POA: Diagnosis not present

## 2020-09-27 MED ORDER — BETAMETHASONE DIPROPIONATE 0.05 % EX CREA: TOPICAL_CREAM | Freq: Two times a day (BID) | CUTANEOUS | 0 refills | Status: DC

## 2020-09-27 MED ORDER — HYDROXYZINE HCL 25 MG PO TABS: 25.0000 mg | ORAL_TABLET | Freq: Three times a day (TID) | ORAL | 0 refills | Status: DC | PRN

## 2020-09-27 MED ORDER — KETOCONAZOLE 2 % EX CREA: 1.0000 "application " | TOPICAL_CREAM | Freq: Every day | CUTANEOUS | 0 refills | Status: DC

## 2020-09-27 NOTE — Patient Instructions (Addendum)
Please continue your Biktarvy every day.  For your eye lids and face - KETOCONAZOLE cream once a day. Stop the mupirocin cream.   For your hands and abdomen - use the BETAMETHASONE cream twice a day for 2 weeks.   Half and half for Eucerin cream and Vaseline  - use this on your abdomen and chest.     Dyshidrotic Eczema Dyshidrotic eczema (pompholyx) is a type of eczema that causes very itchy (pruritic), fluid-filled blisters (vesicles) to form on the hands and feet. It can affect people of any age, but is more common before the age of 33. There is no cure, but treatment and certain lifestyle changes can help relieve symptoms. What are the causes? The cause of this condition is not known. What increases the risk? You are more likely to develop this condition if:  You wash your hands frequently.  You have a personal history or family history of eczema, allergies, asthma, or hay fever.  You are allergic to metals such as nickel or cobalt.  You work with cement.  You smoke. What are the signs or symptoms? Symptoms of this condition may affect the hands, feet, or both. Symptoms may come and go (recur), and may include:  Severe itching, which may happen before blisters appear.  Blisters. These may form suddenly. ? In the early stages, blisters may form near the fingertips. ? In severe cases, blisters may grow to large blister masses (bullae). ? Blisters resolve in 2-3 weeks without bursting. This is followed by a dry phase in which itching eases.  Pain and swelling.  Cracks or long, narrow openings (fissures) in the skin.  Severe dryness.  Ridges on the nails. How is this diagnosed? This condition may be diagnosed based on:  A physical exam.  Your symptoms.  Your medical history.  Skin scrapings to rule out a fungal infection.  Testing a swab of fluid for bacteria (culture).  Removing and checking a small piece of skin (biopsy) in order to test for infection or to  rule out other conditions.  Skin patch tests. These tests involve taking patches that contain possible allergens and placing them on your back. Your health care provider will wait a few days and then check to see if an allergic reaction occurred. These tests may be done if your health care provider suspects allergic reactions, or to rule out other types of eczema. You may be referred to a health care provider who specializes in the skin (dermatologist) to help diagnose and treat this condition. How is this treated? There is no cure for this condition, but treatment can help relieve symptoms. Depending on how many blisters you have and how severe they are, your health care provider may suggest:  Avoiding allergens, irritants, or triggers that worsen symptoms. This may involve lifestyle changes such as: ? Using different lotions or soaps. ? Avoiding hot weather or places that will cause you to sweat a lot. ? Managing stress with coping techniques such as relaxation and exercise, and asking for help when you need it. ? Diet changes as recommended by your health care provider.  Using a clean, damp towel (cool compress) to relieve symptoms.  Soaking in a bath that contains a type of salt that relieves irritation (aluminum acetate soaks).  Medicine taken by mouth to reduce itching (oral antihistamines).  Medicine applied to the skin to reduce swelling and irritation (topical corticosteroids).  Medicine that reduces the activity of the body's disease-fighting system (immunosuppressants) to treat inflammation.  This may be given in severe cases.  Antibiotic medicines to treat bacterial infection.  Light therapy (phototherapy). This involves shining ultraviolet (UV) light on affected skin in order to reduce itchiness and inflammation. Follow these instructions at home: Bathing and skin care   Wash skin gently. After bathing or washing your hands, pat your skin dry. Avoid rubbing your  skin.  Remove all jewelry before bathing. If the skin under the jewelry stays wet, blisters may form or get worse.  Apply cool compresses as told by your health care provider: ? Soak a clean towel in cool water. ? Wring out excess water until towel is damp. ? Place the towel over affected skin. Leave the towel on for 20 minutes at a time, 2-3 times a day.  Use mild soaps, cleansers, and lotions that do not contain dyes, perfumes, or other irritants.  Keep your skin hydrated. To do this: ? Avoid very hot water. Take lukewarm baths or showers. ? Apply moisturizer within three minutes of bathing. This locks in moisture. Medicines  Take and apply over-the-counter and prescription medicines only as told by your health care provider.  If you were prescribed antibiotic medicine, take or apply it as told by your health care provider. Do not stop using the antibiotic even if you start to feel better. General instructions  Identify and avoid triggers and allergens.  Keep fingernails short to avoid breaking open the skin while scratching.  Use waterproof gloves to protect your hands when doing work that keeps your hands wet for a long time.  Wear socks to keep your feet dry.  Do not use any products that contain nicotine or tobacco, such as cigarettes and e-cigarettes. If you need help quitting, ask your health care provider.  Keep all follow-up visits as told by your health care provider. This is important. Contact a health care provider if:  You have symptoms that do not go away.  You have signs of infection, such as: ? Crusting, pus, or a bad smell. ? More redness, swelling, or pain. ? Increased warmth in the affected area. Summary  Dyshidrotic eczema (pompholyx) is a type of eczema that causes very itchy (pruritic), fluid-filled blisters (vesicles) to form on the hands and feet.  The cause of this condition is not known.  There is no cure for this condition, but treatment can  help relieve symptoms. Treatment depends on how many blisters you have and how severe they are.  Use mild soaps, cleansers, and lotions that do not contain dyes, perfumes, or other irritants. Keep your skin hydrated. This information is not intended to replace advice given to you by your health care provider. Make sure you discuss any questions you have with your health care provider. Document Revised: 02/19/2019 Document Reviewed: 03/15/2017 Elsevier Patient Education  [place].

## 2020-09-27 NOTE — Progress Notes (Signed)
Name: Jonathan Rangel  Date of Birth: May 25, 1976  MRN: [id number]  PCP: Madalyn Rob, MD    Patient Active Problem List   Diagnosis Date Noted  . Dyshidrotic eczema 09/29/2020  . Pruritic rash 09/29/2020  . Seasonal allergies 03/31/2020  . Esophageal dysphagia 03/31/2020  . Abdominal pain 10/16/2019  . Anxiety 01/23/2019  . Anal fistula 10/22/2018  . Healthcare maintenance 07/30/2018  . Amphetamine and psychostimulant-induced psychosis with delusions ([place]) 05/21/2018  . Substance induced mood disorder ([place]) 01/24/2018  . Abnormal liver function 11/23/2017  . Chronic pain   . HIV (human immunodeficiency virus infection) ([place]) 11/20/2017  . Rheumatoid arthritis ([place]) 11/20/2017  . Fibromyalgia 11/20/2017  . History of syphilis 01/14/2015     SUBJECTIVE:  Brief Narrative:  Jonathan Rangel is a 44 y.o. man with HIV diagnosed in 2012. He has been historically under excellent control. In care previously in [place].  HIV Risk: MSM. HIV OIs: none   Previous Regimens:   Atripla >> suppressed  Biktarvy 2018 >> suppressed    Chief Complaint  Patient presents with  . Follow-up    declined condoms; offered flu shot; abdomen cramping last day or so; reports being upset related to neighbor issues;          HPI: Unfortunately a neighbor posing as [employer] Employee gained access to [employer] and this has caused a great deal of stress for him. He has re-secured it with changing password.   He has a few concerns today with his health.  Rashes on the hands / fingers - this is ongoing from previous ER visit. It is intensely itchy and sometimes painful. Small blisters on the sides of fingers and boarding nail cuticles. Was treated for herpetic whitlow in ER given it was more isolated to one finger at that time and had h/o cold sores. Not much improvement with that or topical antibacterial creams. He has his hands in bleach / hot water most of the day with cleaning up his mother's incontinent  episodes. Also with dry itchy skin and raised bumpy rash to torso overlying stomach up to chest. Also with a similar appearing rash on penis and above the eyes under lids. Has been using mupirocin ointment to the eyes but "this burns" sometimes and has not really helped much aside from a little with dry skin. No urethral discharge / drainage. No open ulcerations. Not painful but itchy. One longstanding male partner without any other encounters. No lymphadenopathy.  Has had an abscess come up on the right knee that he sought care in ER for - this has healed nicely and no longer causes him pain.   No problems with his Phillips Odor - continues to take this everyday without lapse in dose. Has not taken flu shot yet.    Review of Systems  Constitutional: Negative for chills and fever.  Eyes: Negative for blurred vision and photophobia.  Respiratory: Negative for cough and sputum production.   Cardiovascular: Negative for chest pain.  Gastrointestinal: Negative for diarrhea, nausea and vomiting.  Genitourinary: Negative for dysuria.  Skin: Positive for itching and rash.  Neurological: Negative for headaches.    Past Medical History:  Diagnosis Date  . Anal fistula   . Bell's palsy   . Benign neoplasm of meninges ([place])    gamma knife 05/28/15  . CAD (coronary artery disease)   . Chronic pain   . COPD (chronic obstructive pulmonary disease) ([place])   . Headache   . HIV (human immunodeficiency virus infection) (  [place]) 12/27/2002    Allergies  Allergen Reactions  . Keflex [Cephalexin] Palpitations    Social History   Tobacco Use  . Smoking status: Current Every Day Smoker    Types: Cigarettes  . Smokeless tobacco: Never Used  . Tobacco comment: 5-6 cigs per day  Substance Use Topics  . Alcohol use: Not Currently  . Drug use: Yes    Types: Marijuana    Family History  Problem Relation Age of Onset  . Breast cancer Maternal Grandmother   . Cancer Maternal Aunt     Social History    Substance and Sexual Activity  Sexual Activity Not on file    Physical Exam and Objective Findings:  Vitals:   09/27/20 0924  BP: 123/76  Pulse: 74  Weight: 153 lb (69.4 kg)   Body mass index is 23.26 kg/m.    Physical Exam Vitals reviewed. Exam conducted with a chaperone present.  Constitutional:      Appearance: He is well-developed.     Comments: Seated comfortably in chair during visit.   HENT:     Mouth/Throat:     Lips: Pink.     Mouth: Mucous membranes are moist. No oral lesions.     Dentition: Normal dentition. No dental abscesses.     Tongue: No lesions.     Pharynx: No pharyngeal swelling or oropharyngeal exudate.  Eyes:     Comments: Faint appearing slightly hyperpigmented  flaking rash on the upper lid and eye brows. No drainage, blisters or lesions noted.   Cardiovascular:     Rate and Rhythm: Normal rate and regular rhythm.     Heart sounds: Normal heart sounds.  Pulmonary:     Effort: Pulmonary effort is normal.     Breath sounds: Normal breath sounds.  Abdominal:     General: There is no distension.     Palpations: Abdomen is soft.     Tenderness: There is no abdominal tenderness.  Genitourinary:    Comments: Similar appearing dry hyperpigmented flaking areas to shaft of penis and lower scrotum. No ulcerations, drainage, lymphadenopathy. No cellulitic skin.  Lymphadenopathy:     Cervical: No cervical adenopathy.  Skin:    General: Skin is warm and dry.     Findings: No rash.     Comments: Raised maculopapular hyperpigmented rash diffusely over torso. Some erythema to areas where has obviously been scratching. Back has some small areas but overall more spared than front.  Hands have vesicular / crusted lesions on the inter webbing and lateral aspect of fingers as well as ridging around the nailbeds. No purulence or erythema. Swollen appearing in some areas.  R knee with area of healed previously expressed furuncle. No inflammation or pain.    Neurological:     Mental Status: He is alert and oriented to person, place, and time.  Psychiatric:        Judgment: Judgment normal.     Comments: In good spirits today and engaged in care discussion.      Lab Results Lab Results  Component Value Date   WBC 12.1 (H) 09/02/2020   HGB 13.2 09/02/2020   HCT 40.4 09/02/2020   MCV 97.3 09/02/2020   PLT 258 09/02/2020    Lab Results  Component Value Date   CREATININE 1.09 09/02/2020   BUN 13 09/02/2020   NA 140 09/02/2020   K 3.9 09/02/2020   CL 106 09/02/2020   CO2 27 09/02/2020    Lab Results  Component Value Date  ALT 13 09/02/2020   AST 15 09/02/2020   ALKPHOS 60 09/02/2020   BILITOT 0.4 09/02/2020    Lab Results  Component Value Date   CHOL 196 01/07/2019   HDL 50 01/07/2019   LDLCALC 129 (H) 01/07/2019   TRIG 74 01/07/2019   CHOLHDL 3.9 01/07/2019   HIV 1 RNA Quant (copies/mL)  Date Value  03/09/2020 <20 NOT DETECTED  09/29/2019 <20 NOT DETECTED  01/07/2019 <20 DETECTED (A)   CD4 T Cell Abs (/uL)  Date Value  09/27/2020 870  03/09/2020 1,095  09/29/2019 1,275   Lab Results  Component Value Date   HAV REACTIVE (A) 11/20/2017   Lab Results  Component Value Date   HEPBSAG NON-REACTIVE 11/20/2017   HEPBSAB BORDERLINE (A) 11/20/2017   ASSESSEMTN & PLAN:  Problem List Items Addressed This Visit      Unprioritized   Pruritic rash    Seborrheic and atopic dermatitis likely causing much of his problems. Will do lower potency steroid cream for face / genitals. Shortest course needed < 7d ideally. With more diffuse distribution will try a short steroid taper PO as well. Hydroxyzine TID PRN PO.  1:1 Eucerin + Vaseline mixture applied after showers/baths and throughout the day.       HIV (human immunodeficiency virus infection) ([place]) - Primary (Chronic)    Doing well on Biktarvy and taking it correctly. He has had perfect virologic suppression for some time now. Will update pertinent labs today.    Vaccination counseling discussed at today's visit. Updated as outlined per recommendations for [facility] at today's visit including flu shot. Declined COVID vaccine  Last colon cancer screening: never   Depression screening discussed today - no needs at this time.  Patient is not currently receiving dental care - [facility] enrollment needed. No needs at this time STI screening today given genital rashes.   Return in about 6 months (around 03/27/2021).       Relevant Medications   ketoconazole (NIZORAL) 2 % cream   Other Relevant Orders   HIV-1 RNA quant-no reflex-bld   T-helper cell (CD4)- ([facility] clinic only) (Completed)   History of syphilis    Rash has some features c/w syphilis re-infection but not as likely. Will check RPR for surveillance and reassurance - if this was a new infection we should detect it in the blood given how long this has been going on for him (>2 weeks).       Dyshidrotic eczema    Rashes to his hands most c/w dyshidrotic ezcema. Will try high-potency steroid BID for up to 2 weeks for hands today. Has a history of atopy/allergies. Possible this is triggered by hot water / cleaning products. Would suggest to use gloves, gentle soap, pat dry and frequent lotion as well.  Information provided.        Other Visit Diagnoses    Routine screening for STI (sexually transmitted infection)       Relevant Orders   RPR (Completed)   Urine cytology ancillary only (Completed)   Need for immunization against influenza       Relevant Orders   Flu Vaccine QUAD 36+ mos IM (Completed)      Janene Madeira, MSN, NP-C [facility] [email or web address] Pager: [phone] Office: [place]: [phone]   09/29/2020  10:28 AM

## 2020-09-28 LAB — URINE CYTOLOGY ANCILLARY ONLY
Chlamydia: NEGATIVE
Comment: NEGATIVE
Comment: NORMAL
Neisseria Gonorrhea: NEGATIVE

## 2020-09-28 LAB — T-HELPER CELL (CD4) - (RCID CLINIC ONLY)
CD4 % Helper T Cell: 36 % (ref 33–65)
CD4 T Cell Abs: 870 /uL (ref 400–1790)

## 2020-09-29 ENCOUNTER — Encounter: Payer: Self-pay | Admitting: Physical Medicine and Rehabilitation

## 2020-09-29 ENCOUNTER — Encounter: Payer: Medicare HMO | Attending: Physical Medicine and Rehabilitation | Admitting: Physical Medicine and Rehabilitation

## 2020-09-29 ENCOUNTER — Other Ambulatory Visit: Payer: Self-pay

## 2020-09-29 VITALS — BP 132/86 | HR 104 | Temp 99.1°F | Ht 68.0 in | Wt 150.0 lb

## 2020-09-29 DIAGNOSIS — M7918 Myalgia, other site: Secondary | ICD-10-CM | POA: Diagnosis not present

## 2020-09-29 DIAGNOSIS — L301 Dyshidrosis [pompholyx]: Secondary | ICD-10-CM | POA: Insufficient documentation

## 2020-09-29 DIAGNOSIS — L282 Other prurigo: Secondary | ICD-10-CM | POA: Insufficient documentation

## 2020-09-29 LAB — HIV-1 RNA QUANT-NO REFLEX-BLD
HIV 1 RNA Quant: 44 Copies/mL — ABNORMAL HIGH
HIV-1 RNA Quant, Log: 1.64 Log cps/mL — ABNORMAL HIGH

## 2020-09-29 LAB — FLUORESCENT TREPONEMAL AB(FTA)-IGG-BLD: Fluorescent Treponemal ABS: REACTIVE — AB

## 2020-09-29 LAB — RPR TITER: RPR Titer: 1:4 {titer} — ABNORMAL HIGH

## 2020-09-29 LAB — RPR: RPR Ser Ql: REACTIVE — AB

## 2020-09-29 NOTE — Assessment & Plan Note (Addendum)
Seborrheic and atopic dermatitis likely causing much of his problems. Will do lower potency steroid cream for face / genitals. Shortest course needed < 7d ideally. Hydroxyzine TID PRN PO. May need PO prednisone short term if topical alone is not enough - he will let me know. 1:1 Eucerin + Vaseline mixture applied after showers/baths and throughout the day.

## 2020-09-29 NOTE — Progress Notes (Signed)
Trigger Point Injection  Indication: Lumbar myofascial pain not relieved by medication management and other conservative care.  Informed consent was obtained after describing risk and benefits of the procedure with the patient, this includes bleeding, bruising, infection and medication side effects.  The patient wishes to proceed and has given written consent.  The patient was placed in a seated position.  The cervical area was marked and prepped with Betadine.  It was entered with a 25-gauge 1/2 inch needle and a total of 5 mL of 1% lidocaine and normal saline was injected into a total of 3 trigger points, after negative draw back for blood.  The patient tolerated the procedure well.  Post procedure instructions were given.

## 2020-09-29 NOTE — Assessment & Plan Note (Signed)
Doing well on Biktarvy and taking it correctly. He has had perfect virologic suppression for some time now. Will update pertinent labs today.   Vaccination counseling discussed at today's visit. Updated as outlined per recommendations for [facility] at today's visit including flu shot. Declined COVID vaccine  Last colon cancer screening: never   Depression screening discussed today - no needs at this time.  Patient is not currently receiving dental care - [facility] enrollment needed. No needs at this time STI screening today given genital rashes.   Return in about 6 months (around 03/27/2021).

## 2020-09-29 NOTE — Patient Instructions (Signed)
Podcast: The [employer] Show  Book: The Microbiome Diet   Turmeric to reduce inflammation--can be used in cooking or taken as a supplement.  Benefits of turmeric:  -Highly anti-inflammatory  -Increases antioxidants  -Improves memory, attention, brain disease  -Lowers risk of heart disease  -May help prevent cancer  -Decreases pain  -Alleviates depression  -Delays aging and decreases risk of chronic disease  -Consume with black pepper to increase absorption    Turmeric Milk Recipe:  1 cup milk  1 tsp turmeric  1 tsp cinnamon  1 tsp grated ginger (optional)  Black pepper (boosts the anti-inflammatory properties of turmeric).  1 tsp honey

## 2020-09-29 NOTE — Assessment & Plan Note (Signed)
Rashes to his hands most c/w dyshidrotic ezcema. Will try high-potency steroid BID for up to 2 weeks for hands today. Has a history of atopy/allergies. Possible this is triggered by hot water / cleaning products. Would suggest to use gloves, gentle soap, pat dry and frequent lotion as well.  Information provided.

## 2020-09-29 NOTE — Assessment & Plan Note (Signed)
Rash has some features c/w syphilis re-infection but not as likely. Will check RPR for surveillance and reassurance - if this was a new infection we should detect it in the blood given how long this has been going on for him (>2 weeks).

## 2020-10-29 ENCOUNTER — Other Ambulatory Visit: Payer: Self-pay | Admitting: Internal Medicine

## 2020-10-29 DIAGNOSIS — R0602 Shortness of breath: Secondary | ICD-10-CM

## 2020-10-29 MED ORDER — VENTOLIN HFA 108 (90 BASE) MCG/ACT IN AERS: INHALATION_SPRAY | RESPIRATORY_TRACT | 1 refills | Status: DC

## 2020-10-29 NOTE — Telephone Encounter (Signed)
Front desk--please arrange an appointment with one of our providers at pt's  earliest convenience. Thank you 

## 2020-11-02 ENCOUNTER — Other Ambulatory Visit: Payer: Self-pay | Admitting: Physical Medicine and Rehabilitation

## 2020-11-02 ENCOUNTER — Other Ambulatory Visit: Payer: Self-pay | Admitting: Infectious Diseases

## 2020-11-02 DIAGNOSIS — L282 Other prurigo: Secondary | ICD-10-CM

## 2020-11-02 DIAGNOSIS — Z21 Asymptomatic human immunodeficiency virus [HIV] infection status: Secondary | ICD-10-CM

## 2020-11-02 DIAGNOSIS — L301 Dyshidrosis [pompholyx]: Secondary | ICD-10-CM

## 2020-11-09 ENCOUNTER — Telehealth: Payer: Self-pay | Admitting: *Deleted

## 2020-11-09 NOTE — Telephone Encounter (Signed)
Please let him know that would be fine

## 2020-11-09 NOTE — Telephone Encounter (Signed)
Jonathan Rangel called and said he has an appt with Dr Carlis Abbott on 11/17/20.  He is asking to do a drug screen on that day so he can get medication.

## 2020-11-09 NOTE — Telephone Encounter (Signed)
Ok that is no problem! He is not currently on any controlled substances

## 2020-11-09 NOTE — Telephone Encounter (Signed)
I called him to request that he come in and do the test today or tomorrow (the 24 hour rule) so that we would have the result by his appt date. He declined and said that he would just wait. I let him know, that per our policy we do not do drug screens on patient request. He still declined.

## 2020-11-16 ENCOUNTER — Ambulatory Visit: Payer: Medicare HMO | Admitting: Orthopaedic Surgery

## 2020-11-17 ENCOUNTER — Encounter: Payer: Medicare HMO | Attending: Physical Medicine and Rehabilitation | Admitting: Physical Medicine and Rehabilitation

## 2020-11-17 DIAGNOSIS — M7918 Myalgia, other site: Secondary | ICD-10-CM | POA: Insufficient documentation

## 2020-11-19 ENCOUNTER — Encounter: Payer: Self-pay | Admitting: Physician Assistant

## 2020-11-19 ENCOUNTER — Ambulatory Visit (INDEPENDENT_AMBULATORY_CARE_PROVIDER_SITE_OTHER): Payer: Medicare HMO | Admitting: Orthopaedic Surgery

## 2020-11-19 DIAGNOSIS — M7918 Myalgia, other site: Secondary | ICD-10-CM

## 2020-11-19 DIAGNOSIS — M542 Cervicalgia: Secondary | ICD-10-CM

## 2020-11-19 NOTE — Progress Notes (Signed)
Office Visit Note   Patient: Jonathan Rangel           Date of Birth: 1975/11/15           MRN: [id number] Visit Date: 11/19/2020              Requested by: Madalyn Rob, MD [address] [place],  [place] [address] PCP: Madalyn Rob, MD   Assessment & Plan: Visit Diagnoses:  1. Cervicalgia   2. Myofascial pain syndrome, cervical     Plan: Impression is cervicalgia with myofascial pain syndrome and underlying fibromyalgia.  I have put in a referral for physical therapy and the patient has agreed to attend.  We will also put in a new referral for [facility].  He will follow-up with [place] as needed.  Follow-Up Instructions: No follow-ups on file.   Orders:  Orders Placed This Encounter  Procedures  . Ambulatory referral to Neurology   No orders of the defined types were placed in this encounter.     Procedures: No procedures performed   Clinical Data: No additional findings.   Subjective: Chief Complaint  Patient presents with  . Neck - Pain    HPI patient is a 45 year old gentleman who comes in today with continued neck pain.  He has been seeing [place] for this for a while now.  MRI of the cervical spine back in July showed mild C3-7 spinal canal narrowing and mild right C3-4 and bilateral C4-6 neural foraminal narrowing.  He was having and continues to have bilateral upper extremity paresthesias as well.  He has been referred to a pain clinic where he has had what sounds like trigger point injections to the parascapular regions but was hesitant to proceed with this around the neck.  He has not yet been to physical therapy.  He was referred to [facility] for bilateral upper and extremity nerve conduction studies but no showed multiple times.  Review of Systems as detailed in HPI.  All others reviewed and are negative.   Objective: Vital Signs: There were no vitals taken for this visit.  Physical Exam well-developed  well-nourished gentleman no acute distress.  Alert oriented x3.  Ortho Exam cervical spine exam shows diffuse tenderness throughout.  Increased pain with all motions of the neck.  No focal weakness.  He is neurovascular intact distally.  Specialty Comments:  No specialty comments available.  Imaging: No new imaging   PMFS History: Patient Active Problem List   Diagnosis Date Noted  . Dyshidrotic eczema 09/29/2020  . Pruritic rash 09/29/2020  . Seasonal allergies 03/31/2020  . Esophageal dysphagia 03/31/2020  . Abdominal pain 10/16/2019  . Anxiety 01/23/2019  . Anal fistula 10/22/2018  . Healthcare maintenance 07/30/2018  . Amphetamine and psychostimulant-induced psychosis with delusions ([place]) 05/21/2018  . Substance induced mood disorder ([place]) 01/24/2018  . Abnormal liver function 11/23/2017  . Chronic pain   . HIV (human immunodeficiency virus infection) ([place]) 11/20/2017  . Rheumatoid arthritis ([place]) 11/20/2017  . Fibromyalgia 11/20/2017  . History of syphilis 01/14/2015   Past Medical History:  Diagnosis Date  . Anal fistula   . Bell's palsy   . Benign neoplasm of meninges ([place])    gamma knife 05/28/15  . CAD (coronary artery disease)   . Chronic pain   . COPD (chronic obstructive pulmonary disease) ([place])   . Headache   . HIV (human immunodeficiency virus infection) ([place]) 12/27/2002    Family History  Problem Relation  Age of Onset  . Breast cancer Maternal Grandmother   . Cancer Maternal Aunt     Past Surgical History:  Procedure Laterality Date  . CARPAL TUNNEL RELEASE    . CHOLECYSTECTOMY    . COLONOSCOPY  2015   patient reported normal   . Stereotactic destruction of lesion using gamma radiation  05/28/2015   right temporal lobe   . TONSILLECTOMY     Social History   Occupational History  . Not on file  Tobacco Use  . Smoking status: Current Every Day Smoker    Types: Cigarettes  . Smokeless tobacco: Never Used  . Tobacco comment: 5-6 cigs per day   Substance and Sexual Activity  . Alcohol use: Not Currently  . Drug use: Yes    Types: Marijuana  . Sexual activity: Not on file

## 2020-12-11 ENCOUNTER — Other Ambulatory Visit: Payer: Self-pay | Admitting: Physical Medicine and Rehabilitation

## 2020-12-14 ENCOUNTER — Telehealth: Payer: Self-pay | Admitting: *Deleted

## 2020-12-14 ENCOUNTER — Other Ambulatory Visit: Payer: Self-pay

## 2020-12-14 ENCOUNTER — Ambulatory Visit (INDEPENDENT_AMBULATORY_CARE_PROVIDER_SITE_OTHER): Payer: Medicare HMO | Admitting: Neurology

## 2020-12-14 ENCOUNTER — Encounter: Payer: Self-pay | Admitting: Neurology

## 2020-12-14 VITALS — BP 122/67 | HR 65 | Ht 68.0 in | Wt 150.5 lb

## 2020-12-14 DIAGNOSIS — M542 Cervicalgia: Secondary | ICD-10-CM | POA: Diagnosis not present

## 2020-12-14 DIAGNOSIS — R202 Paresthesia of skin: Secondary | ICD-10-CM | POA: Insufficient documentation

## 2020-12-14 NOTE — Telephone Encounter (Signed)
Per front desk, patient was here at 8am and checked in late due to line.

## 2020-12-14 NOTE — Progress Notes (Signed)
.   Chief Complaint  Patient presents with  . New Patient (Initial Visit)    Evaluation for NCV/EMG for upper extremities due to cervicalgia. He is also under pain management.     HISTORICAL  Jonathan Rangel is a 45 year old male, seen in request by orthopedic surgeon Dr. Lynann Bologna for evaluation of neck pain,  initial evaluation was on December 14, 2020  I reviewed and summarized the referring note. Past medical history HIV Chronic pain management Depression anxiety  He reported that he has neck pain all his life, gradually getting worse over the years, he complains of stiff neck, have to go through different kind of pillows, sometimes he felt radiating pain to his arm, also worried about his neck pain might affecting his mentality,  He denies gait abnormality, denies bowel bladder incontinence  Had a history of right carpal tunnel release surgery, still has intermittent right hand paresthesia  He reported a history of stroke twice in the past, but we personally reviewed MRI of the brain without contrast July 2019 that was normal   Also personally reviewed MRI of cervical spine May 14, 2020, mild multilevel degenerative changes, mild right C3-4, bilateral C4-6 neural foraminal narrowing, no evidence of spinal cord or nerve root compression  REVIEW OF SYSTEMS: Full 14 system review of systems performed and notable only for as above All other review of systems were negative.  ALLERGIES: Allergies  Allergen Reactions  . Keflex [Cephalexin] Palpitations    HOME MEDICATIONS: Current Outpatient Medications  Medication Sig Dispense Refill  . betamethasone dipropionate 0.05 % cream Apply topically 2 (two) times daily. 30 g 0  . BIKTARVY 50-200-25 MG TABS tablet TAKE 1 TABLET BY MOUTH DAILY. TRY TO TAKE AT THE SAME TIME EACH DAY WITH OR WITHOUT FOOD. 30 tablet 6  . cholestyramine (QUESTRAN) 4 g packet Take 1 packet (4 g total) by mouth daily. 60 each 2  . cyclobenzaprine (FLEXERIL) 10  MG tablet TAKE 1 TABLET BY MOUTH DAILY AS NEEDED FOR MUSCLE SPASMS. 30 tablet 0  . doxycycline (VIBRAMYCIN) 100 MG capsule Take 1 capsule (100 mg total) by mouth 2 (two) times daily. One po bid x 7 days 14 capsule 0  . doxycycline (VIBRAMYCIN) 100 MG capsule Take 1 capsule (100 mg total) by mouth 2 (two) times daily. 28 capsule 0  . FLUoxetine (PROZAC) 40 MG capsule Take 40 mg by mouth daily.    . fluticasone (FLONASE) 50 MCG/ACT nasal spray PLACE 1 SPRAY INTO BOTH NOSTRILS DAILY. 48 mL 2  . gabapentin (NEURONTIN) 400 MG capsule TAKE 1 CAPSULE BY MOUTH THREE TIMES A DAY 270 capsule 1  . HYDROcodone-acetaminophen (NORCO) 5-325 MG tablet Take 1 tablet by mouth every 6 (six) hours as needed for severe pain. 10 tablet 0  . HYDROcodone-acetaminophen (NORCO) 7.5-325 MG tablet (Schedule II Drug) TAKE 1 TABLET BY MOUTH EVERY 6 (SIX) HOURS AS NEEDED FOR UP TO 5 DAYS FOR MODERATE PAIN.    . hydrOXYzine (ATARAX/VISTARIL) 25 MG tablet Take 1 tablet (25 mg total) by mouth 3 (three) times daily as needed. 30 tablet 0  . ketoconazole (NIZORAL) 2 % cream Apply 1 application topically daily. 15 g 0  . montelukast (SINGULAIR) 10 MG tablet TAKE 1 TABLET BY MOUTH EVERYDAY AT BEDTIME 30 tablet 1  . ondansetron (ZOFRAN) 4 MG tablet Take 1 tablet (4 mg total) by mouth every 8 (eight) hours as needed for nausea or vomiting. 10 tablet 0  . traZODone (DESYREL) 100 MG tablet TAKE 1  TABLET BY MOUTH EVERYDAY AT BEDTIME 90 tablet 1  . VENTOLIN HFA 108 (90 Base) MCG/ACT inhaler Use 1-2 puffs every 4-6 hours as needed for shortness of breath 18 each 1   No current facility-administered medications for this visit.    PAST MEDICAL HISTORY: Past Medical History:  Diagnosis Date  . Anal fistula   . Bell's palsy   . Benign neoplasm of meninges ([place])    gamma knife 05/28/15  . CAD (coronary artery disease)   . Chronic pain   . COPD (chronic obstructive pulmonary disease) ([place])   . Headache   . HIV (human immunodeficiency  virus infection) ([place]) 12/27/2002    PAST SURGICAL HISTORY: Past Surgical History:  Procedure Laterality Date  . CARPAL TUNNEL RELEASE    . CHOLECYSTECTOMY    . COLONOSCOPY  2015   patient reported normal   . Stereotactic destruction of lesion using gamma radiation  05/28/2015   right temporal lobe   . TONSILLECTOMY      FAMILY HISTORY: Family History  Problem Relation Age of Onset  . Breast cancer Maternal Grandmother   . Cancer Maternal Aunt   . COPD Mother   . Fibromyalgia Mother   . Rheum arthritis Mother   . Other Father        unsure of medical history    SOCIAL HISTORY: Social History   Socioeconomic History  . Marital status: Single    Spouse name: Not on file  . Number of children: 0  . Years of education: 59  . Highest education level: High school graduate  Occupational History  . Occupation: Disabled  Tobacco Use  . Smoking status: Current Every Day Smoker    Types: Cigarettes  . Smokeless tobacco: Never Used  . Tobacco comment: 5-6 cigs per day  Substance and Sexual Activity  . Alcohol use: Not Currently  . Drug use: Yes    Types: Marijuana    Comment: daily use  . Sexual activity: Not on file  Other Topics Concern  . Not on file  Social History Narrative   Lives at home with mother.   Right-handed.   No daily caffeine use.   Social Determinants of Health   Financial Resource Strain: Not on file  Food Insecurity: Not on file  Transportation Needs: Not on file  Physical Activity: Not on file  Stress: Not on file  Social Connections: Not on file  Intimate Partner Violence: Not on file     PHYSICAL EXAM   Vitals:   12/14/20 0814  BP: 122/67  Pulse: 65  Weight: 150 lb 8 oz (68.3 kg)  Height: 5\' 8"  (1.727 m)   Not recorded     Body mass index is 22.88 kg/m.  PHYSICAL EXAMNIATION:  Gen: NAD, conversant, well nourised, well groomed                     Cardiovascular: Regular rate rhythm, no peripheral edema, warm,  nontender. Eyes: Conjunctivae clear without exudates or hemorrhage Neck: Supple, no carotid bruits. Pulmonary: Clear to auscultation bilaterally   NEUROLOGICAL EXAM:  MENTAL STATUS: Speech:    Speech is normal; fluent and spontaneous with normal comprehension.  Cognition:     Orientation to time, place and person     Normal recent and remote memory     Normal Attention span and concentration     Normal Language, naming, repeating,spontaneous speech     Fund of knowledge   CRANIAL NERVES: CN II: Visual fields  are full to confrontation. Pupils are round equal and briskly reactive to light. CN III, IV, VI: extraocular movement are normal. No ptosis. CN V: Facial sensation is intact to light touch CN VII: Face is symmetric with normal eye closure  CN VIII: Hearing is normal to causal conversation. CN IX, X: Phonation is normal. CN XI: Head turning and shoulder shrug are intact  MOTOR: There is no pronator drift of out-stretched arms. Muscle bulk and tone are normal. Muscle strength is normal.  REFLEXES: Reflexes are 2+ and symmetric at the biceps, triceps, knees, and ankles. Plantar responses are flexor.  SENSORY: Intact to light touch, pinprick and vibratory sensation are intact in fingers and toes. With exception of mildly decreased pinprick at bilateral first 3 finger pads  COORDINATION: There is no trunk or limb dysmetria noted.  GAIT/STANCE: Posture is normal. Gait is steady with normal steps, base, arm swing, and turning. Heel and toe walking are normal. Tandem gait is normal.  Romberg is absent.   DIAGNOSTIC DATA (LABS, IMAGING, TESTING) - I reviewed patient records, labs, notes, testing and imaging myself where available.   ASSESSMENT AND PLAN  Ripken Rekowski is a 45 y.o. male   Neck pain, radiating pain to bilateral shoulder  Essentially normal neurological examinations,  Previously normal MRI of the brain, mild degenerative changes on MRI of cervical spine  His  complaints of pain are most consistent with musculoskeletal etiology  I have suggested him hold compression, neck stretching exercise, he is to continue follow-up with his pain management  I do not think EMG nerve conduction study can add on extra information in his management, patient also recall previous needle pain/bad memory with EMG study, does not want to proceed with it.  Marcial Pacas, M.D. Ph.D.  [facility] [address], [place], [place] [address] Ph: [phone] Fax: [phone]  CC:  Leandrew Koyanagi, MD [place],  [place] [address]  Madalyn Rob, MD

## 2020-12-14 NOTE — Telephone Encounter (Signed)
No showed new patient appointment. 

## 2020-12-15 ENCOUNTER — Encounter: Payer: Medicare HMO | Admitting: Internal Medicine

## 2020-12-16 ENCOUNTER — Ambulatory Visit (INDEPENDENT_AMBULATORY_CARE_PROVIDER_SITE_OTHER): Payer: Medicare HMO | Admitting: Internal Medicine

## 2020-12-16 ENCOUNTER — Encounter: Payer: Self-pay | Admitting: Internal Medicine

## 2020-12-16 ENCOUNTER — Other Ambulatory Visit: Payer: Self-pay

## 2020-12-16 VITALS — BP 102/62 | HR 84 | Temp 98.5°F | Ht 68.0 in | Wt 149.9 lb

## 2020-12-16 DIAGNOSIS — F419 Anxiety disorder, unspecified: Secondary | ICD-10-CM

## 2020-12-16 MED ORDER — FLUOXETINE HCL 20 MG PO CAPS: 60.0000 mg | ORAL_CAPSULE | Freq: Every day | ORAL | 2 refills | Status: DC

## 2020-12-16 NOTE — Patient Instructions (Signed)
Thank you, Jonathan Rangel for allowing [place] to provide your care today. Today we discussed anxiety.    I have ordered the following labs for you:  Lab Orders  No laboratory test(s) ordered today     Tests ordered today:    Referrals ordered today:   Referral Orders  No referral(s) requested today     I have ordered the following medication/changed the following medications:   Stop the following medications: Medications Discontinued During This Encounter  Medication Reason  . doxycycline (VIBRAMYCIN) 100 MG capsule   . doxycycline (VIBRAMYCIN) 100 MG capsule   . betamethasone dipropionate 0.05 % cream Discontinued by provider  . ketoconazole (NIZORAL) 2 % cream Completed Course  . cyclobenzaprine (FLEXERIL) 10 MG tablet Completed Course     Start the following medications: No orders of the defined types were placed in this encounter.    Follow up: 3 months    Remember: Talk to [facility] and see if they have a counselor available through their clinic for you to see.  Should you have any questions or concerns please call the internal medicine clinic at [phone].      Tamsen Snider, M.D. [place]

## 2020-12-16 NOTE — Progress Notes (Signed)
   CC: anxiety  HPI:Jonathan Rangel is a 45 y.o. male who presents for evaluation of anxiety. Please see individual problem based A/P for details.  Depression, PHQ-9: Based on the patients  [place] Visit from 12/16/2020 in [facility]  PHQ-9 Total Score 16      Past Medical History:  Diagnosis Date  . Anal fistula   . Bell's palsy   . Benign neoplasm of meninges ([place])    gamma knife 05/28/15  . CAD (coronary artery disease)   . Chronic pain   . COPD (chronic obstructive pulmonary disease) ([place])   . Headache   . HIV (human immunodeficiency virus infection) ([place]) 12/27/2002   Review of Systems:   ROS   Physical Exam: Vitals:   12/16/20 1418  BP: 102/62  Pulse: 84  Temp: 98.5 F (36.9 C)  TempSrc: Oral  SpO2: 98%  Weight: 149 lb 14.4 oz (68 kg)  Height: 5\' 8"  (1.727 m)    Physical Exam Constitutional:      General: He is not in acute distress.    Appearance: He is not ill-appearing.  Cardiovascular:     Rate and Rhythm: Normal rate and regular rhythm.  Pulmonary:     Effort: Pulmonary effort is normal.     Breath sounds: Normal breath sounds.  Abdominal:     General: Bowel sounds are normal.     Palpations: Abdomen is soft.  Skin:    General: Skin is warm and dry.  Psychiatric:     Comments: Anxious mood , anxious affect       Assessment & Plan:   See Encounters Tab for problem based charting.  Patient discussed with Dr. Jimmye Norman

## 2020-12-17 ENCOUNTER — Other Ambulatory Visit: Payer: Self-pay | Admitting: Internal Medicine

## 2020-12-17 ENCOUNTER — Encounter: Payer: Self-pay | Admitting: Internal Medicine
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
## 2020-12-21 NOTE — Progress Notes (Signed)
Internal Medicine Clinic Attending  Case discussed with Dr. Court Joy  At the time of the visit.  We reviewed the resident's history and exam and pertinent patient test results.  I agree with the assessment, diagnosis, and plan of care documented in the resident's note. This person's anxiety has a significant situational component, which is difficult to address unless his living environment can be changed.  If increasing prozac is not beneficial, would reduce the dose back to 40 and add another agent such as buspirone.  Note also that for some individuals, Prozac can have an undesirable activating effect and may not be the optimal SSRI choice for him if this level of anxiety persists.
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
## 2021-01-05 ENCOUNTER — Telehealth: Payer: Self-pay | Admitting: Orthopaedic Surgery

## 2021-01-05 NOTE — Telephone Encounter (Signed)
I advised patient I have not received any forms. Advised him to call [employer] to see if they have.

## 2021-01-05 NOTE — Telephone Encounter (Signed)
Pt called stating he has some forms being faxed regarding a disability appeal and he wants to make sure Dr.Xu gets it, fills it out, and sends it back before 01/26/21. If we could to update pt when this has been done he would greatly appreciate it.   [phone]

## 2021-01-08 ENCOUNTER — Other Ambulatory Visit: Payer: Self-pay | Admitting: Internal Medicine

## 2021-01-08 DIAGNOSIS — F419 Anxiety disorder, unspecified: Secondary | ICD-10-CM

## 2021-02-14 ENCOUNTER — Other Ambulatory Visit: Payer: Self-pay | Admitting: Internal Medicine

## 2021-02-14 DIAGNOSIS — R0602 Shortness of breath: Secondary | ICD-10-CM

## 2021-02-15 ENCOUNTER — Ambulatory Visit (INDEPENDENT_AMBULATORY_CARE_PROVIDER_SITE_OTHER): Payer: Medicare HMO | Admitting: Infectious Diseases

## 2021-02-15 ENCOUNTER — Other Ambulatory Visit: Payer: Self-pay

## 2021-02-15 ENCOUNTER — Encounter: Payer: Self-pay | Admitting: Infectious Diseases

## 2021-02-15 VITALS — BP 120/76 | HR 67 | Temp 98.3°F | Wt 157.0 lb

## 2021-02-15 DIAGNOSIS — Z21 Asymptomatic human immunodeficiency virus [HIV] infection status: Secondary | ICD-10-CM

## 2021-02-15 DIAGNOSIS — Z8619 Personal history of other infectious and parasitic diseases: Secondary | ICD-10-CM

## 2021-02-15 DIAGNOSIS — K603 Anal fistula: Secondary | ICD-10-CM | POA: Diagnosis not present

## 2021-02-15 DIAGNOSIS — H538 Other visual disturbances: Secondary | ICD-10-CM

## 2021-02-15 DIAGNOSIS — B20 Human immunodeficiency virus [HIV] disease: Secondary | ICD-10-CM | POA: Diagnosis not present

## 2021-02-15 DIAGNOSIS — Z113 Encounter for screening for infections with a predominantly sexual mode of transmission: Secondary | ICD-10-CM | POA: Diagnosis not present

## 2021-02-15 NOTE — Patient Instructions (Signed)
Nice to see you today  Please continue your biktarvy once daily.   Please stop by the lab on your way out.    Will help to get you referred to General Surgery and Ophthalmology.   Please come back in 6 months for routine care. Labs same day works fine if you prefer that.

## 2021-02-15 NOTE — Assessment & Plan Note (Signed)
Stable on Biktarvy once daily. Will update pertinent labs today and continue current treatment plan.  STI screen today.  Return in about 6 months (around 08/17/2021).

## 2021-02-15 NOTE — Addendum Note (Signed)
Addended by: Caffie Pinto on: 02/15/2021 04:50 PM   Modules accepted: Orders

## 2021-02-15 NOTE — Assessment & Plan Note (Addendum)
Will refer him to general surgery to discuss further management given this has increased drainage and discomfort lately. No needs for antibiotics today as it is not inflamed or infected appearing. Will collect rectal swab to screen for STI component.

## 2021-02-15 NOTE — Assessment & Plan Note (Signed)
Repeat titer today - we spent time discussing he has had no concerning findings on exam or serology indicating new infection. Explained that he has serofast antibodies. Will follow RPR today.

## 2021-02-15 NOTE — Progress Notes (Signed)
Name: Jonathan Rangel  Date of Birth: 1976/05/30  MRN: [id number]  PCP: Madalyn Rob, MD     SUBJECTIVE:  Brief Narrative:  Jonathan Rangel is a 45 y.o. man with HIV diagnosed in 2012. He has been historically under excellent control. In care previously in [place].  HIV Risk: MSM. HIV OIs: none   Previous Regimens:   Atripla >> suppressed  Biktarvy 2018 >> suppressed    Chief Complaint  Patient presents with  . Follow-up    B20        HPI: Moved to a new area with his mother and much more pleasant for him. Less stressful. Continues to take his biktarvy without any problems or concerns. No missed doses, ever. Wondering how his syphilis labs are looking.   Anal fistula continues to drain. Has some scarring and hard spots. Has been draining since November 2021 this is painful sometime.  Also has some pain along the left lateral flank and fullness.  Requesting to see a surgeon about this.     Review of Systems  Constitutional: Negative for chills and fever.  Eyes: Negative for blurred vision and photophobia.  Respiratory: Negative for cough and sputum production.   Cardiovascular: Negative for chest pain.  Gastrointestinal: Negative for diarrhea, nausea and vomiting.  Genitourinary: Negative for dysuria.  Skin: Positive for itching and rash.  Neurological: Negative for headaches.    Past Medical History:  Diagnosis Date  . Anal fistula   . Bell's palsy   . Benign neoplasm of meninges ([place])    gamma knife 05/28/15  . CAD (coronary artery disease)   . Chronic pain   . COPD (chronic obstructive pulmonary disease) ([place])   . Headache   . HIV (human immunodeficiency virus infection) ([place]) 12/27/2002    Allergies  Allergen Reactions  . Keflex [Cephalexin] Palpitations    Social History   Tobacco Use  . Smoking status: Current Every Day Smoker    Packs/day: 0.50    Types: Cigarettes  . Smokeless tobacco: Never Used  Substance Use Topics  . Alcohol use: Not  Currently  . Drug use: Yes    Types: Marijuana    Comment: daily use      Social History   Substance and Sexual Activity  Sexual Activity Not Currently   Comment: declined condoms 02/2021    Physical Exam and Objective Findings:  Vitals:   02/15/21 1016  BP: 120/76  Pulse: 67  Temp: 98.3 F (36.8 C)  TempSrc: Oral  Weight: 157 lb (71.2 kg)   Body mass index is 23.87 kg/m.    Physical Exam Vitals reviewed.  HENT:     Mouth/Throat:     Mouth: No oral lesions.     Dentition: Normal dentition. No dental caries.  Eyes:     General: No scleral icterus. Cardiovascular:     Rate and Rhythm: Normal rate and regular rhythm.     Heart sounds: Normal heart sounds.  Pulmonary:     Effort: Pulmonary effort is normal.     Breath sounds: Normal breath sounds.  Abdominal:     General: There is no distension.     Palpations: Abdomen is soft.     Tenderness: There is no abdominal tenderness.  Genitourinary:      Comments: White drainage without erythema or much pain. There appears to be tunneling under the skin.  Lymphadenopathy:     Cervical: No cervical adenopathy.  Skin:    General: Skin is warm and dry.  Findings: No rash.  Neurological:     Mental Status: He is alert and oriented to person, place, and time.     Lab Results Lab Results  Component Value Date   WBC 12.1 (H) 09/02/2020   HGB 13.2 09/02/2020   HCT 40.4 09/02/2020   MCV 97.3 09/02/2020   PLT 258 09/02/2020    Lab Results  Component Value Date   CREATININE 1.09 09/02/2020   BUN 13 09/02/2020   NA 140 09/02/2020   K 3.9 09/02/2020   CL 106 09/02/2020   CO2 27 09/02/2020    Lab Results  Component Value Date   ALT 13 09/02/2020   AST 15 09/02/2020   ALKPHOS 60 09/02/2020   BILITOT 0.4 09/02/2020    Lab Results  Component Value Date   CHOL 196 01/07/2019   HDL 50 01/07/2019   LDLCALC 129 (H) 01/07/2019   TRIG 74 01/07/2019   CHOLHDL 3.9 01/07/2019   HIV 1 RNA Quant  Date Value   09/27/2020 44 Copies/mL (H)  03/09/2020 <20 NOT DETECTED copies/mL  09/29/2019 <20 NOT DETECTED copies/mL   CD4 T Cell Abs (/uL)  Date Value  09/27/2020 870  03/09/2020 1,095  09/29/2019 1,275   Lab Results  Component Value Date   HAV REACTIVE (A) 11/20/2017   Lab Results  Component Value Date   HEPBSAG NON-REACTIVE 11/20/2017   HEPBSAB BORDERLINE (A) 11/20/2017   ASSESSEMTN & PLAN:  Problem List Items Addressed This Visit      Unprioritized   HIV (human immunodeficiency virus infection) ([place]) - Primary (Chronic)    Stable on Biktarvy once daily. Will update pertinent labs today and continue current treatment plan.  STI screen today.  Return in about 6 months (around 08/17/2021).       Relevant Orders   HIV-1 RNA quant-no reflex-bld   RPR   History of syphilis    Repeat titer today - we spent time discussing he has had no concerning findings on exam or serology indicating new infection. Explained that he has serofast antibodies. Will follow RPR today.       Blurry vision, bilateral    Referral to ophthalmology for assessment. Sounds like it is possible he has cataracts from his description of haloing with lights.       Relevant Orders   Ambulatory referral to Ophthalmology   Anal fistula    Will refer him to general surgery to discuss further management given this has increased drainage and discomfort lately. No needs for antibiotics today as it is not inflamed or infected appearing. Will collect rectal swab to screen for STI component.       Relevant Orders   Ambulatory referral to General Surgery    Other Visit Diagnoses    Routine screening for STI (sexually transmitted infection)       Relevant Orders   Cytology (oral, anal, urethral) ancillary only      Janene Madeira, MSN, NP-C [facility] [email or web address] Pager: [phone] Office: [place]: [phone]    02/15/2021  2:54 PM

## 2021-02-15 NOTE — Assessment & Plan Note (Signed)
Referral to ophthalmology for assessment. Sounds like it is possible he has cataracts from his description of haloing with lights.

## 2021-02-17 LAB — RPR: RPR Ser Ql: REACTIVE — AB

## 2021-02-17 LAB — FLUORESCENT TREPONEMAL AB(FTA)-IGG-BLD: Fluorescent Treponemal ABS: REACTIVE — AB

## 2021-02-17 LAB — HIV-1 RNA QUANT-NO REFLEX-BLD
HIV 1 RNA Quant: <20 Copies/mL — ABNORMAL HIGH
HIV-1 RNA Quant, Log: <1.3 Log cps/mL — ABNORMAL HIGH

## 2021-02-17 LAB — RPR TITER: RPR Titer: 1:4 {titer} — ABNORMAL HIGH

## 2021-03-07 ENCOUNTER — Telehealth: Payer: Self-pay

## 2021-03-07 NOTE — Telephone Encounter (Signed)
Patient is coming in for a follow up on Thursday the 28th and he is requesting Tr[employer]. Is that okay?

## 2021-03-07 NOTE — Telephone Encounter (Signed)
Yes that is ok! Including Lattie Haw and Renae for auth/appointment type, thank you!

## 2021-03-07 NOTE — Telephone Encounter (Signed)
Sent to ADMIN for authorization

## 2021-03-10 ENCOUNTER — Encounter: Payer: Medicare HMO | Attending: Physical Medicine and Rehabilitation | Admitting: Physical Medicine and Rehabilitation

## 2021-03-10 ENCOUNTER — Other Ambulatory Visit: Payer: Self-pay

## 2021-03-10 ENCOUNTER — Encounter: Payer: Self-pay | Admitting: Physical Medicine and Rehabilitation

## 2021-03-10 VITALS — BP 142/91 | HR 78 | Temp 98.3°F | Ht 68.0 in | Wt 162.4 lb

## 2021-03-10 DIAGNOSIS — M7918 Myalgia, other site: Secondary | ICD-10-CM | POA: Diagnosis not present

## 2021-03-10 DIAGNOSIS — M4802 Spinal stenosis, cervical region: Secondary | ICD-10-CM | POA: Diagnosis not present

## 2021-03-10 DIAGNOSIS — M48062 Spinal stenosis, lumbar region with neurogenic claudication: Secondary | ICD-10-CM | POA: Insufficient documentation

## 2021-03-13 ENCOUNTER — Other Ambulatory Visit: Payer: Self-pay | Admitting: Internal Medicine

## 2021-03-14 NOTE — Telephone Encounter (Signed)
Called to discuss refill of Trazodone. Unable to reach patient.  Left message for patient to call the clinic and ask for me this morning.

## 2021-03-14 NOTE — Telephone Encounter (Signed)
trazodone no longer on list Next Appt With Internal Medicine Lorene Dy, MD) 03/16/2021 at 10:15 AM   Address at visit?

## 2021-03-14 NOTE — Telephone Encounter (Signed)
Called patient after lunch. He has been taking Trazodone for 4 years for insomnia. He denies any side effects to this medication. Plan to refill.

## 2021-03-16 ENCOUNTER — Ambulatory Visit: Payer: Medicaid Other | Admitting: Internal Medicine

## 2021-03-16 ENCOUNTER — Encounter: Payer: Self-pay | Admitting: Internal Medicine

## 2021-03-16 VITALS — BP 145/88 | HR 70 | Temp 98.4°F | Ht 68.0 in | Wt 166.2 lb

## 2021-03-16 DIAGNOSIS — Z0000 Encounter for general adult medical examination without abnormal findings: Secondary | ICD-10-CM | POA: Diagnosis present

## 2021-03-16 DIAGNOSIS — F419 Anxiety disorder, unspecified: Secondary | ICD-10-CM

## 2021-03-16 DIAGNOSIS — Z23 Encounter for immunization: Secondary | ICD-10-CM | POA: Diagnosis not present

## 2021-03-16 DIAGNOSIS — R030 Elevated blood-pressure reading, without diagnosis of hypertension: Secondary | ICD-10-CM

## 2021-03-16 NOTE — Assessment & Plan Note (Signed)
Tdap today

## 2021-03-16 NOTE — Assessment & Plan Note (Signed)
Patient reports decreased anxiety since increasing dose of Prozac. In addition he has moved. He is still concerned with a person who he feels is "stalking him". I addressed this seems like paranoia, but we had further discussion. He says this is a person everyone sees. This person he believes was going to try to take advantage of his elderly mother and this is why she has it out for him. He denies hearing voices or seeing other people. We will continue to monitor at visits.

## 2021-03-16 NOTE — Progress Notes (Signed)
   CC: Healthcare maintenance , anxiety , and elevated blood pressure reading   HPI:[name] is a 45 y.o. male who presents for evaluation of healthcare maintenance , anxiety , and elevated blood pressure reading  . Please see individual problem based A/P for details.   Past Medical History:  Diagnosis Date  . Anal fistula   . Bell's palsy   . Benign neoplasm of meninges ([place])    gamma knife 05/28/15  . CAD (coronary artery disease)   . Chronic pain   . COPD (chronic obstructive pulmonary disease) ([place])   . Headache   . HIV (human immunodeficiency virus infection) ([place]) 12/27/2002   Review of Systems:   Review of Systems  Constitutional: Negative for chills and fever.  Psychiatric/Behavioral: Negative for hallucinations. The patient is not nervous/anxious.      Physical Exam: Vitals:   03/16/21 1016 03/16/21 1112  BP: (!) 147/83 (!) 145/88  Pulse: 72 70  Temp: 98.4 F (36.9 C)   TempSrc: Oral   SpO2: 100%   Weight: 166 lb 3.2 oz (75.4 kg)   Height: 5\' 8"  (1.727 m)      General: NAD, normal weight  HEENT: Conjunctiva nl , antiicteric sclerae, moist mucous membranes, no exudate or erythema Cardiovascular: Normal rate, regular rhythm.  No murmurs, rubs, or gallops Pulmonary : Equal breath sounds, No wheezes, rales, or rhonchi Abdominal: soft, nontender,  bowel sounds present Ext: No edema in lower extremities, no tenderness to palpation of lower extremities.   Assessment & Plan:   See Encounters Tab for problem based charting.  Patient discussed with Dr. Evette Doffing

## 2021-03-16 NOTE — Patient Instructions (Addendum)
Thank you for trusting me with your care. To recap, today we discussed the following:    Anxiety - Improving . Keep avoiding negative people and I am happy the medication is helping.   Healthcare Maintenance - You will receive Tdap today. You will be up to date on all your preventive healthcare  Blood pressure - Your blood pressure was elevated on initial check, but improved on recheck.   Neck Pain -Agree with current plan in place by your pain management physician.

## 2021-03-16 NOTE — Assessment & Plan Note (Addendum)
Patient is having neck pain. Has MRI scheduled and under pain management contract with another provider. Blood pressure elevated and only slightly improved on recheck. Will defer treatment until next visit given patient is having pain.   BP Readings from Last 3 Encounters:  03/16/21 (!) 145/88  03/10/21 (!) 142/91  02/15/21 120/76

## 2021-03-17 ENCOUNTER — Encounter: Payer: Self-pay | Admitting: Internal Medicine

## 2021-03-17 ENCOUNTER — Encounter: Payer: Self-pay | Admitting: *Deleted

## 2021-03-17 NOTE — Progress Notes (Unsigned)

## 2021-03-17 NOTE — Progress Notes (Signed)
Things That May Be Affecting Your Health:  Alcohol  Hearing loss x Pain   x Depression  Home Safety  Sexual Health   Diabetes  Lack of physical activity  Stress   Difficulty with daily activities  Loneliness  Tiredness   Drug use  Medicines x Tobacco use   Falls  Motor Vehicle Safety  Weight   Food choices  Oral Health  Other    YOUR PERSONALIZED HEALTH PLAN : 1. Schedule your next subsequent Medicare Wellness visit in one year 2. Attend all of your regular appointments to address your medical issues 3. Complete the preventative screenings and services   Annual Wellness Visit   Medicare Covered Preventative Screenings and [place] Men and Women Who How Often Need? Date of Last Service Action  Abdominal Aortic Aneurysm Adults with AAA risk factors Once      Alcohol Misuse and Counseling All Adults Screening once a year if no alcohol misuse. Counseling up to 4 face to face sessions.     Bone Density Measurement  Adults at risk for osteoporosis Once every 2 yrs      Lipid Panel Z13.6 All adults without CV disease Once every 5 yrs       Colorectal Cancer   Stool sample or  Colonoscopy All adults 64 and older   Once every year  Every 10 years        Depression All Adults Once a year  Today   Diabetes Screening Blood glucose, post glucose load, or GTT Z13.1  All adults at risk  Pre-diabetics  Once per year  Twice per year      Diabetes  Self-Management Training All adults Diabetics 10 hrs first year; 2 hours subsequent years. Requires Copay     Glaucoma  Diabetics  Family history of glaucoma  African Americans 64 yrs +  Hispanic Americans 66 yrs + Annually - requires coppay      Hepatitis C Z72.89 or F19.20  High Risk for HCV  Born between 1945 and 1965  Annually  Once x     HIV Z11.4 All adults based on risk  Annually btw ages 67 & 28 regardless of risk  Annually > 65 yrs if at increased risk      Lung Cancer Screening  Asymptomatic adults aged 17-77 with 30 pack yr history and current smoker OR quit within the last 15 yrs Annually Must have counseling and shared decision making documentation before first screen      Medical Nutrition Therapy Adults with   Diabetes  Renal disease  Kidney transplant within past 3 yrs 3 hours first year; 2 hours subsequent years     Obesity and Counseling All adults Screening once a year Counseling if BMI 30 or higher  Today   Tobacco Use Counseling Adults who use tobacco  Up to 8 visits in one year x    Vaccines Z23  Hepatitis B  Influenza   Pneumonia  Adults   Once  Once every flu season  Two different vaccines separated by one year     Next Annual Wellness Visit People with Medicare Every year  Today     Services & Screenings Women Who How Often Need  Date of Last Service Action  Mammogram  Z12.31 Women over 57 One baseline ages 16-39. Annually ager 40 yrs+      Pap tests All women Annually if high risk. Every 2 yrs for normal risk women  Screening for cervical cancer with   Pap (Z01.419 nl or Z01.411abnl) &  HPV Z11.51 Women aged 30 to 55 Once every 5 yrs     Screening pelvic and breast exams All women Annually if high risk. Every 2 yrs for normal risk women     Sexually Transmitted Diseases  Chlamydia  Gonorrhea  Syphilis All at risk adults Annually for non pregnant females at increased risk         [place] Men Who How Ofter Need  Date of Last Service Action  Prostate Cancer - DRE & PSA Men over 50 Annually.  DRE might require a copay.        Sexually Transmitted Diseases  Syphilis All at risk adults Annually for men at increased risk      Health Maintenance List Health Maintenance  Topic Date Due  . COVID-19 Vaccine (3 - Moderna risk 4-dose series) 07/20/2020  . INFLUENZA VACCINE  06/13/2021  . COLONOSCOPY (Pts 45-12yrs Insurance coverage will need to be confirmed)  01/28/2030  . TETANUS/TDAP  03/17/2031   . Hepatitis C Screening  Completed  . HIV Screening  Completed  . HPV VACCINES  Aged Out

## 2021-03-17 NOTE — Progress Notes (Signed)
Internal Medicine Clinic Attending  Case discussed with Dr. Steen  At the time of the visit.  We reviewed the resident's history and exam and pertinent patient test results.  I agree with the assessment, diagnosis, and plan of care documented in the resident's note.  

## 2021-03-22 ENCOUNTER — Ambulatory Visit (HOSPITAL_COMMUNITY): Admission: RE | Admit: 2021-03-22 | Payer: Medicare HMO | Source: Ambulatory Visit

## 2021-03-22 ENCOUNTER — Ambulatory Visit (HOSPITAL_COMMUNITY): Payer: Medicare HMO

## 2021-03-25 ENCOUNTER — Other Ambulatory Visit (HOSPITAL_COMMUNITY): Payer: Self-pay

## 2021-04-22 ENCOUNTER — Ambulatory Visit (HOSPITAL_COMMUNITY): Payer: Medicare HMO

## 2021-04-22 ENCOUNTER — Ambulatory Visit (HOSPITAL_COMMUNITY): Admission: RE | Admit: 2021-04-22 | Payer: Medicare HMO | Source: Ambulatory Visit

## 2021-04-28 ENCOUNTER — Telehealth: Payer: Self-pay

## 2021-04-28 MED ORDER — GABAPENTIN 400 MG PO CAPS: ORAL_CAPSULE | ORAL | 1 refills | Status: DC

## 2021-04-28 NOTE — Telephone Encounter (Signed)
Patient called stating he needed a refill on Gabapentin but according to his med list you didn't prescribe. Are you taking over prescribing Gabapentin?

## 2021-05-04 ENCOUNTER — Ambulatory Visit (HOSPITAL_COMMUNITY)
Admission: RE | Admit: 2021-05-04 | Discharge: 2021-05-04 | Disposition: A | Discharge status: Home / Self Care | Payer: Medicare HMO | Source: Ambulatory Visit | Attending: Physical Medicine and Rehabilitation | Admitting: Physical Medicine and Rehabilitation

## 2021-05-04 ENCOUNTER — Other Ambulatory Visit: Payer: Self-pay

## 2021-05-04 DIAGNOSIS — M48062 Spinal stenosis, lumbar region with neurogenic claudication: Secondary | ICD-10-CM | POA: Insufficient documentation

## 2021-05-04 DIAGNOSIS — M4802 Spinal stenosis, cervical region: Secondary | ICD-10-CM | POA: Insufficient documentation

## 2021-05-04 DIAGNOSIS — M542 Cervicalgia: Secondary | ICD-10-CM | POA: Diagnosis not present

## 2021-05-04 DIAGNOSIS — M545 Low back pain, unspecified: Secondary | ICD-10-CM | POA: Diagnosis not present

## 2021-05-05 ENCOUNTER — Encounter: Payer: Medicare HMO | Attending: Physical Medicine and Rehabilitation | Admitting: Physical Medicine and Rehabilitation

## 2021-05-05 DIAGNOSIS — M7918 Myalgia, other site: Secondary | ICD-10-CM | POA: Insufficient documentation

## 2021-05-05 DIAGNOSIS — M48062 Spinal stenosis, lumbar region with neurogenic claudication: Secondary | ICD-10-CM | POA: Insufficient documentation

## 2021-05-05 DIAGNOSIS — M4802 Spinal stenosis, cervical region: Secondary | ICD-10-CM | POA: Insufficient documentation

## 2021-06-02 ENCOUNTER — Encounter: Payer: Self-pay | Admitting: Physical Medicine and Rehabilitation

## 2021-06-02 ENCOUNTER — Encounter: Payer: Medicare HMO | Attending: Physical Medicine and Rehabilitation | Admitting: Physical Medicine and Rehabilitation

## 2021-06-02 ENCOUNTER — Other Ambulatory Visit: Payer: Self-pay

## 2021-06-02 VITALS — BP 156/101 | HR 65 | Temp 98.2°F | Ht 68.0 in | Wt 158.0 lb

## 2021-06-02 DIAGNOSIS — M7918 Myalgia, other site: Secondary | ICD-10-CM | POA: Diagnosis not present

## 2021-06-02 DIAGNOSIS — R6889 Other general symptoms and signs: Secondary | ICD-10-CM | POA: Diagnosis not present

## 2021-06-02 MED ORDER — PREDNISONE 1 MG PO TABS
2.0000 mg | ORAL_TABLET | Freq: Every day | ORAL | 3 refills | Status: DC
Start: 2021-06-02 — End: 2021-10-19

## 2021-06-02 NOTE — Progress Notes (Signed)
Trigger Point Injection  Indication: Cervical myofascial pain not relieved by medication management and other conservative care.  Informed consent was obtained after describing risk and benefits of the procedure with the patient, this includes bleeding, bruising, infection and medication side effects.  The patient wishes to proceed and has given written consent.  The patient was placed in a seated position.  The cervical area was marked and prepped with Betadine.  It was entered with a 25-gauge 1/2 inch needle and a total of 5 mL of 1% lidocaine was injected into a total of 7 trigger points, after negative draw back for blood.  The patient tolerated the procedure well.  Post procedure instructions were given.

## 2021-06-07 ENCOUNTER — Ambulatory Visit: Payer: Self-pay | Admitting: General Surgery

## 2021-06-07 DIAGNOSIS — K603 Anal fistula: Secondary | ICD-10-CM | POA: Diagnosis not present

## 2021-06-07 NOTE — H&P (View-Only) (Signed)
REFERRING PHYSICIAN:  Xenia Callas, NP  PROVIDER:  Monico Blitz, MD  MRN: [id number] DOB: November 07, 1976 DATE OF ENCOUNTER: 06/07/2021  Subjective   Chief Complaint: anal fistula     History of Present Illness: Jonathan Rangel is a 45 y.o. male who is seen today as an office consultation at the request of Dr. Doren Custard for evaluation of anal fistula Patient states he underwent a anal fistula surgery in 2004.  He had recurrence of his symptoms within several months of surgery.  Since that time he has had intermittent pain and drainage.  Patient has HIV.  It is well controlled with medications.  His last CD4 count was 870.  Patient denies any incontinence currently but does have urgency on occasion.     Review of Systems: A complete review of systems was obtained from the patient.  I have reviewed this information and discussed as appropriate with the patient.  See HPI as well for other ROS.     Medical History: Past Medical History:  Diagnosis Date   Arthritis    Asthma, unspecified asthma severity, unspecified whether complicated, unspecified whether persistent    COPD (chronic obstructive pulmonary disease) (CMS-HCC)    History of stroke    HIV -AIDS with opportunistic infection, Symptomatic (CMS-HCC)     Patient Active Problem List  Diagnosis   Abdominal pain   Abnormal liver function   Amphetamine and psychostimulant-induced psychosis with delusions (CMS-HCC)   Anal fistula   Anxiety   Blurry vision, bilateral   Dyshidrotic eczema   Elevated blood pressure reading   Esophageal dysphagia   Chronic pain   Fibromyalgia   Neck pain   HIV (human immunodeficiency virus infection) (CMS-HCC)   History of syphilis   Paresthesia   Pruritic rash   Rheumatoid arthritis (CMS-HCC)   Seasonal allergies   Substance induced mood disorder (CMS-HCC)    Past Surgical History:  Procedure Laterality Date   CHOLECYSTECTOMY     ENDOSCOPIC CARPAL TUNNEL RELEASE  N/A    FISTULOTOMY ANAL N/A    TONSILLECTOMY N/A      Allergies  Allergen Reactions   Cephalexin Palpitations    Current Outpatient Medications on File Prior to Visit  Medication Sig Dispense Refill   fluticasone propionate (FLONASE) 50 mcg/actuation nasal spray Place into one nostril     montelukast (SINGULAIR) 10 mg tablet Take 1 tablet by mouth nightly     BIKTARVY 50-200-25 mg tablet TAKE 1 TABLET BY MOUTH DAILY. TRY TO TAKE AT THE SAME TIME EACH DAY WITH OR WITHOUT FOOD     cholestyramine (QUESTRAN) 4 gram oral powder packet MIX 1 PACKET AS DIRECTED AND DRINK ONCE DAILY     FLUoxetine (PROZAC) 20 MG capsule Take 60 mg by mouth once daily     gabapentin (NEURONTIN) 400 MG capsule Take 400 mg by mouth 3 (three) times daily     hydrOXYzine (ATARAX) 25 MG tablet Take 25 mg by mouth 3 (three) times daily as needed     ondansetron (ZOFRAN) 4 MG tablet Take 4 mg by mouth every 8 (eight) hours as needed     traZODone (DESYREL) 100 MG tablet Take 100 mg by mouth nightly     VENTOLIN HFA 90 mcg/actuation inhaler USE 1-2 PUFFS EVERY 4-6 HOURS AS NEEDED FOR SHORTNESS OF BREATH     No current facility-administered medications on file prior to visit.    Family History  Problem Relation Age of Onset   Skin cancer  Mother    High blood pressure (Hypertension) Mother    Diabetes Mother    Breast cancer Maternal Grandmother      Social History   Tobacco Use  Smoking Status Current Every Day Smoker   Packs/day: 0.25   Types: Cigarettes  Smokeless Tobacco Never Used     Social History   Socioeconomic History   Marital status: Unknown  Tobacco Use   Smoking status: Current Every Day Smoker    Packs/day: 0.25    Types: Cigarettes   Smokeless tobacco: Never Used  Vaping Use   Vaping Use: Never used  Substance and Sexual Activity   Alcohol use: Never   Drug use: Defer   Sexual activity: Defer    Objective:    Vitals:   06/07/21 0951  BP: 120/74  Pulse: 68  Temp: 36.7 C  (98 F)  Weight: 74.7 kg (164 lb 9.6 oz)  Height: 172.7 cm ('5\' 8"'$ )     Exam Gen: NAD Abd: soft Rectal: L lateral ext opening with purulence   Labs, Imaging and Diagnostic Testing: CD4, viral loads reviewed  Assessment and Plan:  Diagnoses and all orders for this visit:  Anal fistula     45 year old male who presents to the office with an anal fistula.  It appears that he had a fistulotomy in 2004.  We will obtain these operative notes to confirm this.  If he recurred after fistulotomy there is a high chance that this could be a difficult fistula to control.  I recommended beginning with a exam under anesthesia and seton placement.

## 2021-06-07 NOTE — H&P (Signed)
REFERRING PHYSICIAN:  Metolius Callas, NP  PROVIDER:  Monico Blitz, MD  MRN: [id number] DOB: 03/24/1976 DATE OF ENCOUNTER: 06/07/2021  Subjective   Chief Complaint: anal fistula     History of Present Illness: Jonathan Rangel is a 45 y.o. male who is seen today as an office consultation at the request of Dr. Doren Custard for evaluation of anal fistula Patient states he underwent a anal fistula surgery in 2004.  He had recurrence of his symptoms within several months of surgery.  Since that time he has had intermittent pain and drainage.  Patient has HIV.  It is well controlled with medications.  His last CD4 count was 870.  Patient denies any incontinence currently but does have urgency on occasion.     Review of Systems: A complete review of systems was obtained from the patient.  I have reviewed this information and discussed as appropriate with the patient.  See HPI as well for other ROS.     Medical History: Past Medical History:  Diagnosis Date   Arthritis    Asthma, unspecified asthma severity, unspecified whether complicated, unspecified whether persistent    COPD (chronic obstructive pulmonary disease) (CMS-HCC)    History of stroke    HIV -AIDS with opportunistic infection, Symptomatic (CMS-HCC)     Patient Active Problem List  Diagnosis   Abdominal pain   Abnormal liver function   Amphetamine and psychostimulant-induced psychosis with delusions (CMS-HCC)   Anal fistula   Anxiety   Blurry vision, bilateral   Dyshidrotic eczema   Elevated blood pressure reading   Esophageal dysphagia   Chronic pain   Fibromyalgia   Neck pain   HIV (human immunodeficiency virus infection) (CMS-HCC)   History of syphilis   Paresthesia   Pruritic rash   Rheumatoid arthritis (CMS-HCC)   Seasonal allergies   Substance induced mood disorder (CMS-HCC)    Past Surgical History:  Procedure Laterality Date   CHOLECYSTECTOMY     ENDOSCOPIC CARPAL TUNNEL RELEASE  N/A    FISTULOTOMY ANAL N/A    TONSILLECTOMY N/A      Allergies  Allergen Reactions   Cephalexin Palpitations    Current Outpatient Medications on File Prior to Visit  Medication Sig Dispense Refill   fluticasone propionate (FLONASE) 50 mcg/actuation nasal spray Place into one nostril     montelukast (SINGULAIR) 10 mg tablet Take 1 tablet by mouth nightly     BIKTARVY 50-200-25 mg tablet TAKE 1 TABLET BY MOUTH DAILY. TRY TO TAKE AT THE SAME TIME EACH DAY WITH OR WITHOUT FOOD     cholestyramine (QUESTRAN) 4 gram oral powder packet MIX 1 PACKET AS DIRECTED AND DRINK ONCE DAILY     FLUoxetine (PROZAC) 20 MG capsule Take 60 mg by mouth once daily     gabapentin (NEURONTIN) 400 MG capsule Take 400 mg by mouth 3 (three) times daily     hydrOXYzine (ATARAX) 25 MG tablet Take 25 mg by mouth 3 (three) times daily as needed     ondansetron (ZOFRAN) 4 MG tablet Take 4 mg by mouth every 8 (eight) hours as needed     traZODone (DESYREL) 100 MG tablet Take 100 mg by mouth nightly     VENTOLIN HFA 90 mcg/actuation inhaler USE 1-2 PUFFS EVERY 4-6 HOURS AS NEEDED FOR SHORTNESS OF BREATH     No current facility-administered medications on file prior to visit.    Family History  Problem Relation Age of Onset   Skin cancer  Mother    High blood pressure (Hypertension) Mother    Diabetes Mother    Breast cancer Maternal Grandmother      Social History   Tobacco Use  Smoking Status Current Every Day Smoker   Packs/day: 0.25   Types: Cigarettes  Smokeless Tobacco Never Used     Social History   Socioeconomic History   Marital status: Unknown  Tobacco Use   Smoking status: Current Every Day Smoker    Packs/day: 0.25    Types: Cigarettes   Smokeless tobacco: Never Used  Vaping Use   Vaping Use: Never used  Substance and Sexual Activity   Alcohol use: Never   Drug use: Defer   Sexual activity: Defer    Objective:    Vitals:   06/07/21 0951  BP: 120/74  Pulse: 68  Temp: 36.7 C  (98 F)  Weight: 74.7 kg (164 lb 9.6 oz)  Height: 172.7 cm ('5\' 8"'$ )     Exam Gen: NAD Abd: soft Rectal: L lateral ext opening with purulence   Labs, Imaging and Diagnostic Testing: CD4, viral loads reviewed  Assessment and Plan:  Diagnoses and all orders for this visit:  Anal fistula     45 year old male who presents to the office with an anal fistula.  It appears that he had a fistulotomy in 2004.  We will obtain these operative notes to confirm this.  If he recurred after fistulotomy there is a high chance that this could be a difficult fistula to control.  I recommended beginning with a exam under anesthesia and seton placement.

## 2021-06-15 ENCOUNTER — Telehealth: Payer: Self-pay

## 2021-06-15 ENCOUNTER — Other Ambulatory Visit: Payer: Self-pay | Admitting: Internal Medicine

## 2021-06-15 DIAGNOSIS — L301 Dyshidrosis [pompholyx]: Secondary | ICD-10-CM

## 2021-06-15 MED ORDER — BETAMETHASONE DIPROPIONATE 0.05 % EX CREA: TOPICAL_CREAM | Freq: Two times a day (BID) | CUTANEOUS | 0 refills | Status: DC

## 2021-06-15 NOTE — Telephone Encounter (Signed)
Patient called requesting refill on eczema cream. States he thinks it was triamcinolone, RN unable to find history of this medication. Advised him to reach out to PCP for new prescription. Patient verbalized understanding and has no further questions.   Beryle Flock, RN

## 2021-06-15 NOTE — Telephone Encounter (Signed)
Patient returned call. States it is a cream that he uses for the eczema on his hands. Usually Dr. Doren Custard prescribes this but she is out of the office at present.

## 2021-06-15 NOTE — Progress Notes (Signed)
1. Dyshidrotic eczema Refilled prescription requested , as below.  - betamethasone dipropionate 0.05 % cream; Apply topically 2 (two) times daily.  Dispense: 30 g; Refill: 0

## 2021-06-15 NOTE — Telephone Encounter (Signed)
PT is requesting a call back .. he stated that his other PCP in his care team is out of the office and will be gone for a while she had given him some cream  for his hands ,but he is in need of refills and the nurse at the office told him to reach out to [place]

## 2021-06-15 NOTE — Telephone Encounter (Signed)
Prescription sent

## 2021-06-15 NOTE — Telephone Encounter (Signed)
Returned call to patient. No answer. Left message on VM requesting return call.  °

## 2021-06-16 ENCOUNTER — Telehealth: Payer: Self-pay

## 2021-06-16 ENCOUNTER — Encounter (HOSPITAL_BASED_OUTPATIENT_CLINIC_OR_DEPARTMENT_OTHER): Payer: Self-pay | Admitting: General Surgery

## 2021-06-16 ENCOUNTER — Other Ambulatory Visit: Payer: Self-pay | Admitting: Physical Medicine and Rehabilitation

## 2021-06-16 DIAGNOSIS — M4802 Spinal stenosis, cervical region: Secondary | ICD-10-CM

## 2021-06-16 NOTE — Telephone Encounter (Signed)
Jonathan Rangel called:   He stated Dr. Nelva Bush will need a referral placed by you, for the Epidural injection. So the procedure can be sent to his insurance for approval.  Thank you. Call back phone [phone].

## 2021-06-17 ENCOUNTER — Other Ambulatory Visit: Payer: Self-pay

## 2021-06-17 ENCOUNTER — Ambulatory Visit: Payer: Medicare HMO | Attending: Physical Medicine and Rehabilitation

## 2021-06-17 DIAGNOSIS — R209 Unspecified disturbances of skin sensation: Secondary | ICD-10-CM | POA: Insufficient documentation

## 2021-06-17 DIAGNOSIS — M25611 Stiffness of right shoulder, not elsewhere classified: Secondary | ICD-10-CM | POA: Insufficient documentation

## 2021-06-17 DIAGNOSIS — M542 Cervicalgia: Secondary | ICD-10-CM | POA: Insufficient documentation

## 2021-06-17 DIAGNOSIS — R293 Abnormal posture: Secondary | ICD-10-CM | POA: Insufficient documentation

## 2021-06-17 DIAGNOSIS — M25612 Stiffness of left shoulder, not elsewhere classified: Secondary | ICD-10-CM | POA: Insufficient documentation

## 2021-06-17 DIAGNOSIS — M6281 Muscle weakness (generalized): Secondary | ICD-10-CM | POA: Insufficient documentation

## 2021-06-17 NOTE — Patient Instructions (Signed)
Access Code: [email or web address] URL: [email or web address] Date: 06/17/2021 Prepared by: Sherlynn Stalls  Exercises Seated Gentle Upper Trapezius Stretch - 1 x daily - 7 x weekly - 4 sets - 15-20 seconds hold Gentle Levator Scapulae Stretch - 1 x daily - 7 x weekly - 4 sets - 15-20 seconds hold Seated Scapular Retraction - 1 x daily - 7 x weekly - 2 sets - 10 reps - 5 seconds hold Supine Chin Tuck - 1 x daily - 7 x weekly - 2 sets - 10 reps - 5 seconds hold Heat - 1 x daily - 7 x weekly - 10-20 minutes hold

## 2021-06-17 NOTE — Therapy (Signed)
[place] [place], [place], [address] Phone: [phone]   Fax:  [phone]  Physical Therapy Evaluation  Patient Details  Name: Jonathan Rangel MRN: [id number] Date of Birth: 12/11/1975 Referring Provider (PT): Ranell Patrick, Clide Deutscher, MD - [facility]   Encounter Date: 06/17/2021   PT End of Session - 06/17/21 0959     Visit Number 1    Date for PT Re-Evaluation 08/12/21    PT Start Time 0930    PT Stop Time 1005    PT Time Calculation (min) 35 min    Activity Tolerance Patient tolerated treatment well;Patient limited by pain    Behavior During Therapy [facility] for tasks assessed/performed             Past Medical History:  Diagnosis Date   Anal fistula    Bell's palsy    Benign neoplasm of meninges ([place])    gamma knife 05/28/15   CAD (coronary artery disease)    Chronic pain    COPD (chronic obstructive pulmonary disease) ([place])    Eczema    Headache    History of benign neoplasm of cerebral meninges    s/p  gamma knife 05-28-2015  right temporal lobe   HIV (human immunodeficiency virus infection) ([place]) 12/27/2002   followed by Janene Madeira NP @ [facility]    Past Surgical History:  Procedure Laterality Date   ANAL FISTULOTOMY  2004   CARPAL TUNNEL RELEASE     CHOLECYSTECTOMY     COLONOSCOPY  2015   patient reported normal    Stereotactic destruction of lesion using gamma radiation  05/28/2015   right temporal lobe    TONSILLECTOMY      There were no vitals filed for this visit.    Subjective Assessment - 06/17/21 0931     Subjective neck pain has been going on for a long time, "neck issues sinc eI was a child", history of neck stiffness. Reports noticing back issues since he was young. Typically wakes up stiff and sore and gets pain in back into neck and B arms    Pertinent History per chart review: HIV, benign meningeal neoplasms, asthma,  headaches.  fibromyalgia neuropathy and RA (pt reported)     Patient Stated Goals to find a way to subside the pain and function more - be able to bathe, brush teeth, prep meals.    Currently in Pain? Yes    Pain Score 10-Worst pain ever    Pain Location Neck    Pain Orientation Left;Right    Pain Descriptors / Indicators Tightness;Sore    Pain Type Chronic pain    Pain Radiating Towards B hands, BUE    Pain Onset More than a month ago    Pain Frequency Constant   it will ease up but never go away   Aggravating Factors  everything, cold    Pain Relieving Factors marijuana. Heat, , back support, braces.                [facility] PT Assessment - 06/17/21 0928       Assessment   Medical Diagnosis M48.02 (ICD-10-CM) - Spinal stenosis in cervical region    Referring Provider (PT) Ranell Patrick, Clide Deutscher, MD - [facility]    Hand Dominance Right      Balance Screen   Has the patient fallen in the past 6 months No    Has the patient had a decrease in activity level because  of a fear of falling?  No   but does report some fear of falling   Is the patient reluctant to leave their home because of a fear of falling?  No      Home Environment   Additional Comments apartment, with mother - difficulty reaching, cleaning, ADLs and IADLs.      Prior Function   Vocation On disability    Vocation Requirements since 2004      Cognition   Overall Cognitive Status Within Functional Limits for tasks assessed      Posture/Postural Control   Posture Comments stiff, forward shoulder and head.      ROM / Strength   AROM / PROM / Strength AROM;Strength      AROM   Overall AROM Comments cervical: 75% with end range tightness. lateral flexion 75% B with tightness B. Rotation full B but reports feeling crackling. Shoulders: reports feels numb in both arms. limited to 100 deg flexion B      Strength   Overall Strength Comments BUE grossly 3-/5 effortful and pain with neck and shoulders. Grossly diminished posturla strength.   Grip strength 65# on the right  dominant side and 25# on the left nondominant     Flexibility   Soft Tissue Assessment /Muscle Length --   V tight cervical paraspinals, UT/LS R worse than left     Ambulation/Gait   Gait Comments slow, guarded                        Objective measurements completed on examination: See above findings.               PT Education - 06/17/21 1005     Education Details PT POC and HEP. Access Code: [email or web address].Safe use of heat as needed.    Person(s) Educated Patient    Methods Explanation;Demonstration;Handout    Comprehension Verbalized understanding;Need further instruction;Returned demonstration              PT Short Term Goals - 06/17/21 1022       PT SHORT TERM GOAL #1   Title independent with initial HEP    Time 2    Period Weeks    Status New    Target Date 07/01/21               PT Long Term Goals - 06/17/21 1022       PT LONG TERM GOAL #1   Title Independent with advanced HEP    Time 8    Period Weeks    Status New    Target Date 08/12/21      PT LONG TERM GOAL #2   Title Pt will report  at least 50% improvement in neck pain and symptoms    Time 8    Period Weeks    Status New    Target Date 08/12/21      PT LONG TERM GOAL #3   Title Pt will report improved ability to complete meal prep and hygiene activities less limtied by pain    Time 8    Period Weeks    Status New    Target Date 08/12/21      PT LONG TERM GOAL #4   Title Pt wil dmeonstrate improved cervical and shoulder ROM to full and symmetrical with </= 4/10 pain less frequently.    Time 8    Period Weeks    Status New    Target Date 08/12/21  PT LONG TERM GOAL #5   Title BUE and postural strength to at least 4+/5    Time 8    Period Weeks    Status New    Target Date 08/12/21                    Plan - 06/17/21 1006     Clinical Impression Statement Pt is a 45 yo male who presents with history of chronic neck pain with spinal  stenosis. PMH significant for HIV, bells palsy x 2 right sided, asthma, headaches, fibromyalgia neuropathy and RA. He reports longstanding history of neck tightness into back and gets nmbness in arms/hands B. He has seen neurology earlier this year but per chart review nero exam normal and imaging with degenerative changes of spine. He currently presents with limited neck ROM d/t mms tightness and discomforty, decreased/effortful/painful UE ROM bilaterally with pain and numbness into arms. Grossly diminished postural and UE strength , Grip strength 65# on the right dominant side and 25# on the left nondominant but he did have difficulty feeling whether he was squeezing dynamometer or not during testing. He will benefit from skilled PT to address aforementioned impairments, decrease pain and improve function.    Personal Factors and Comorbidities Comorbidity 3+;Time since onset of injury/illness/exacerbation;Past/Current Experience    Examination-Activity Limitations Bathing;Reach Overhead;Carry;Dressing;Hygiene/Grooming;Toileting    Examination-Participation Restrictions Meal Prep;Cleaning;Driving;Laundry;Shop    Stability/Clinical Decision Making Evolving/Moderate complexity    Clinical Decision Making Moderate    Rehab Potential Fair    PT Frequency 1x / week    PT Duration 8 weeks    PT Treatment/Interventions ADLs/Self Care Home Management;Cryotherapy;Electrical Stimulation;Moist Heat;Traction;Neuromuscular re-education;Balance training;Therapeutic exercise;Therapeutic activities;Functional mobility training;Patient/family education;Manual techniques;Taping    PT Next Visit Plan Reassess HEP and progress as tolerated. Neck and trunk mobility, flexibility, postural/cervical/UE strengthening B. Manual and modalities as needed    PT Home Exercise Plan see pt instructions    Consulted and Agree with Plan of Care Patient             Patient will benefit from skilled therapeutic intervention in  order to improve the following deficits and impairments:  Decreased range of motion, Increased fascial restricitons, Impaired UE functional use, Increased muscle spasms, Decreased endurance, Pain, Improper body mechanics, Impaired flexibility, Decreased mobility, Decreased strength, Impaired sensation  Visit Diagnosis: Muscle weakness (generalized)  Cervicalgia  Stiffness of left shoulder, not elsewhere classified  Stiffness of right shoulder, not elsewhere classified  Unspecified disturbances of skin sensation  Abnormal posture     Problem List Patient Active Problem List   Diagnosis Date Noted   Elevated blood pressure reading 03/16/2021   Blurry vision, bilateral 02/15/2021   Neck pain 12/14/2020   Paresthesia 12/14/2020   Dyshidrotic eczema 09/29/2020   Pruritic rash 09/29/2020   Seasonal allergies 03/31/2020   Esophageal dysphagia 03/31/2020   Abdominal pain 10/16/2019   Anxiety 01/23/2019   Anal fistula 10/22/2018   Healthcare maintenance 07/30/2018   Amphetamine and psychostimulant-induced psychosis with delusions ([place]) 05/21/2018   Substance induced mood disorder ([place]) 01/24/2018   Abnormal liver function 11/23/2017   Chronic pain    HIV (human immunodeficiency virus infection) ([place]) 11/20/2017   Rheumatoid arthritis ([place]) 11/20/2017   Fibromyalgia 11/20/2017   History of syphilis 01/14/2015    Hall Busing, PT, DPT 06/17/2021, 12:06 PM  [place]. [place], [place], [address] Phone: [phone]   Fax:  [phone]  Name: Jonathan Rangel MRN:  [id number] Date of Birth: 04-Jun-1976

## 2021-06-20 ENCOUNTER — Encounter (HOSPITAL_BASED_OUTPATIENT_CLINIC_OR_DEPARTMENT_OTHER): Payer: Self-pay | Admitting: General Surgery

## 2021-06-20 ENCOUNTER — Other Ambulatory Visit: Payer: Self-pay

## 2021-06-20 NOTE — Progress Notes (Signed)
Spoke w/ via phone for pre-op interview--- Pt Lab needs dos----  no             Lab results------ no COVID test -----patient states asymptomatic no test needed Arrive at ------- 0730 on 06-22-2021 NPO after MN NO Solid Food.  Clear liquids from MN until--- 0630 Med rec completed Medications to take morning of surgery ----- Gabapentin, Prednisone Diabetic medication ----- n/a Patient instructed no nail polish to be worn day of surgery Patient instructed to bring photo id and insurance card day of surgery Patient aware to have Driver (ride ) --friend, pt to have name & number at check-in/ caregiver    for 24 hours after surgery --boyfriend,  Administrator Patient Special Instructions ----- pt will do nebulizer night before surgery and bring rescue inhaler dos Pre-Op special Istructions ----- n/a Patient verbalized understanding of instructions that were given at this phone interview. Patient denies shortness of breath, chest pain, fever, cough at this phone interview.

## 2021-06-21 ENCOUNTER — Ambulatory Visit: Payer: Medicare HMO | Admitting: Physical Therapy

## 2021-06-21 DIAGNOSIS — M6281 Muscle weakness (generalized): Secondary | ICD-10-CM | POA: Diagnosis not present

## 2021-06-21 DIAGNOSIS — M25612 Stiffness of left shoulder, not elsewhere classified: Secondary | ICD-10-CM | POA: Diagnosis not present

## 2021-06-21 DIAGNOSIS — R293 Abnormal posture: Secondary | ICD-10-CM | POA: Diagnosis not present

## 2021-06-21 DIAGNOSIS — M25611 Stiffness of right shoulder, not elsewhere classified: Secondary | ICD-10-CM | POA: Diagnosis not present

## 2021-06-21 DIAGNOSIS — M542 Cervicalgia: Secondary | ICD-10-CM

## 2021-06-21 DIAGNOSIS — R209 Unspecified disturbances of skin sensation: Secondary | ICD-10-CM | POA: Diagnosis not present

## 2021-06-21 NOTE — Therapy (Signed)
[place]. [place], [place], [address] Phone: [phone]   Fax:  [phone]  Physical Therapy Treatment  Patient Details  Name: Mckai Deats MRN: [id number] Date of Birth: January 13, 1976 Referring Provider (PT): Ranell Patrick, Clide Deutscher, MD - [facility]   Encounter Date: 06/21/2021   PT End of Session - 06/21/21 1258     Visit Number 2    Date for PT Re-Evaluation 08/12/21    PT Start Time [id number]    PT Stop Time 1320    PT Time Calculation (min) 50 min             Past Medical History:  Diagnosis Date   Anal fistula    Asthma with COPD (chronic obstructive pulmonary disease) ([place])    followed by pcp   Chronic headache disorder    residual from benign neoplasm cerebral meninges post gamma knife 2016   Chronic pain    followed by pain clinic   Eczema    Fibromyalgia    History of Bell's palsy    per pt 1997 and 2015 both time right side , in 2015 found to have meninges tumor   History of benign neoplasm of cerebral meninges    s/p  gamma knife 05-28-2015  right temporal lobe   HIV (human immunodeficiency virus infection) ([place]) 2012   followed by Janene Madeira NP @ [facility]   RA (rheumatoid arthritis) ([place])    per pt was dx approx 2008,  followed by pain clinic , does not have a rheumatologist    Past Surgical History:  Procedure Laterality Date   ANAL FISTULOTOMY  2004   [place] Right 2014   CHOLECYSTECTOMY, LAPAROSCOPIC  2009   COLONOSCOPY  2015   patient reported normal    Stereotactic destruction of lesion using gamma radiation  05/28/2015   right temporal lobe ;  in [place], [place]    There were no vitals filed for this visit.   Subjective Assessment - 06/21/21 1234     Subjective doing HEP and using some muscles I have not in awhile. I am interested in DN . waist down is killing me    Currently in Pain? Yes    Pain Score 10-Worst pain ever    Pain  Location Neck                               [facility] Adult PT Treatment/Exercise - 06/21/21 0001       Exercises   Exercises Neck      Neck Exercises: Machines for Strengthening   UBE (Upper Arm Bike) L 1 2 min fwd/2 min back      Neck Exercises: Standing   Other Standing Exercises red tband shld ext,row and ER 10 x    Other Standing Exercises 4# shruggs,backward rolls and shld ext 10x      Modalities   Modalities Moist Heat;Electrical Stimulation      Moist Heat Therapy   Number Minutes Moist Heat 15 Minutes    Moist Heat Location Lumbar Spine      Electrical Stimulation   Electrical Stimulation Location cerv    Electrical Stimulation Action IFC/prone    Electrical Stimulation Parameters traps    Electrical Stimulation Goals Pain      Manual Therapy   Manual Therapy Soft tissue mobilization    Soft tissue mobilization bilateral  upper traps, the levator and the cpspinals              Trigger Point Dry Needling - 06/21/21 0001     Consent Given? Yes    Education Handout Provided Yes    Muscles Treated Head and Neck Upper trapezius    Upper Trapezius Response Twitch reponse elicited;Palpable increased muscle length                    PT Short Term Goals - 06/21/21 1300       PT SHORT TERM GOAL #1   Title independent with initial HEP    Status Achieved               PT Long Term Goals - 06/17/21 1022       PT LONG TERM GOAL #1   Title Independent with advanced HEP    Time 8    Period Weeks    Status New    Target Date 08/12/21      PT LONG TERM GOAL #2   Title Pt will report  at least 50% improvement in neck pain and symptoms    Time 8    Period Weeks    Status New    Target Date 08/12/21      PT LONG TERM GOAL #3   Title Pt will report improved ability to complete meal prep and hygiene activities less limtied by pain    Time 8    Period Weeks    Status New    Target Date 08/12/21      PT LONG TERM GOAL #4    Title Pt wil dmeonstrate improved cervical and shoulder ROM to full and symmetrical with </= 4/10 pain less frequently.    Time 8    Period Weeks    Status New    Target Date 08/12/21      PT LONG TERM GOAL #5   Title BUE and postural strength to at least 4+/5    Time 8    Period Weeks    Status New    Target Date 08/12/21                   Plan - 06/21/21 1258     Clinical Impression Statement pt arrived moving very slow and antalgic. c/o pain all over. pt request DNing since he was told about it eval. pt tolerated initial ther ex well but did need cuing for posture and to keep head up and shld back. pt with good relief with DN and estim.MH    PT Treatment/Interventions ADLs/Self Care Home Management;Cryotherapy;Electrical Stimulation;Moist Heat;Traction;Neuromuscular re-education;Balance training;Therapeutic exercise;Therapeutic activities;Functional mobility training;Patient/family education;Manual techniques;Taping    PT Next Visit Plan assess response to tx and progress             Patient will benefit from skilled therapeutic intervention in order to improve the following deficits and impairments:  Decreased range of motion, Increased fascial restricitons, Impaired UE functional use, Increased muscle spasms, Decreased endurance, Pain, Improper body mechanics, Impaired flexibility, Decreased mobility, Decreased strength, Impaired sensation  Visit Diagnosis: Muscle weakness (generalized)  Cervicalgia  Stiffness of left shoulder, not elsewhere classified  Stiffness of right shoulder, not elsewhere classified     Problem List Patient Active Problem List   Diagnosis Date Noted   Elevated blood pressure reading 03/16/2021   Blurry vision, bilateral 02/15/2021   Neck pain 12/14/2020   Paresthesia 12/14/2020   Dyshidrotic eczema 09/29/2020   Pruritic  rash 09/29/2020   Seasonal allergies 03/31/2020   Esophageal dysphagia 03/31/2020   Abdominal pain  10/16/2019   Anxiety 01/23/2019   Anal fistula 10/22/2018   Healthcare maintenance 07/30/2018   Amphetamine and psychostimulant-induced psychosis with delusions ([place]) 05/21/2018   Substance induced mood disorder ([place]) 01/24/2018   Abnormal liver function 11/23/2017   Chronic pain    HIV (human immunodeficiency virus infection) ([place]) 11/20/2017   Rheumatoid arthritis ([place]) 11/20/2017   Fibromyalgia 11/20/2017   History of syphilis 01/14/2015    Lum Babe W PTA 06/21/2021, 2:53 PM  [place]. [place], [place], [address] Phone: [phone]   Fax:  [phone]  Name: Graycen Hadorn MRN: [id number] Date of Birth: 1976-04-30

## 2021-06-21 NOTE — Patient Instructions (Signed)

## 2021-06-22 ENCOUNTER — Ambulatory Visit (HOSPITAL_BASED_OUTPATIENT_CLINIC_OR_DEPARTMENT_OTHER): Payer: Medicare HMO | Admitting: Anesthesiology

## 2021-06-22 ENCOUNTER — Ambulatory Visit: Payer: Medicare HMO

## 2021-06-22 ENCOUNTER — Other Ambulatory Visit: Payer: Self-pay

## 2021-06-22 ENCOUNTER — Encounter (HOSPITAL_BASED_OUTPATIENT_CLINIC_OR_DEPARTMENT_OTHER)
Admission: RE | Disposition: A | Discharge status: Home / Self Care | Payer: Self-pay | Source: Home / Self Care | Attending: General Surgery

## 2021-06-22 ENCOUNTER — Encounter (HOSPITAL_BASED_OUTPATIENT_CLINIC_OR_DEPARTMENT_OTHER): Payer: Self-pay | Admitting: General Surgery

## 2021-06-22 ENCOUNTER — Ambulatory Visit (HOSPITAL_BASED_OUTPATIENT_CLINIC_OR_DEPARTMENT_OTHER)
Admission: RE | Admit: 2021-06-22 | Discharge: 2021-06-22 | Disposition: A | Discharge status: Home / Self Care | Payer: Medicare HMO | Attending: General Surgery | Admitting: General Surgery

## 2021-06-22 DIAGNOSIS — F17210 Nicotine dependence, cigarettes, uncomplicated: Secondary | ICD-10-CM | POA: Insufficient documentation

## 2021-06-22 DIAGNOSIS — Z7951 Long term (current) use of inhaled steroids: Secondary | ICD-10-CM | POA: Insufficient documentation

## 2021-06-22 DIAGNOSIS — Z79899 Other long term (current) drug therapy: Secondary | ICD-10-CM | POA: Diagnosis not present

## 2021-06-22 DIAGNOSIS — Z833 Family history of diabetes mellitus: Secondary | ICD-10-CM | POA: Insufficient documentation

## 2021-06-22 DIAGNOSIS — Z803 Family history of malignant neoplasm of breast: Secondary | ICD-10-CM | POA: Insufficient documentation

## 2021-06-22 DIAGNOSIS — J449 Chronic obstructive pulmonary disease, unspecified: Secondary | ICD-10-CM | POA: Diagnosis not present

## 2021-06-22 DIAGNOSIS — Z881 Allergy status to other antibiotic agents status: Secondary | ICD-10-CM | POA: Insufficient documentation

## 2021-06-22 DIAGNOSIS — Z8249 Family history of ischemic heart disease and other diseases of the circulatory system: Secondary | ICD-10-CM | POA: Insufficient documentation

## 2021-06-22 DIAGNOSIS — F419 Anxiety disorder, unspecified: Secondary | ICD-10-CM | POA: Diagnosis not present

## 2021-06-22 DIAGNOSIS — M797 Fibromyalgia: Secondary | ICD-10-CM | POA: Diagnosis not present

## 2021-06-22 DIAGNOSIS — K610 Anal abscess: Secondary | ICD-10-CM | POA: Insufficient documentation

## 2021-06-22 HISTORY — DX: Dermatitis, unspecified: L30.9

## 2021-06-22 HISTORY — DX: Personal history of other diseases of the nervous system and sense organs: Z86.69

## 2021-06-22 HISTORY — PX: RECTAL EXAM UNDER ANESTHESIA: SHX6399

## 2021-06-22 HISTORY — DX: Other chronic pain: G89.29

## 2021-06-22 HISTORY — DX: Chronic obstructive pulmonary disease, unspecified: J44.9

## 2021-06-22 HISTORY — PX: PLACEMENT OF SETON: SHX6029

## 2021-06-22 HISTORY — DX: Fibromyalgia: M79.7

## 2021-06-22 HISTORY — DX: Rheumatoid arthritis, unspecified: M06.9

## 2021-06-22 HISTORY — DX: Headache, unspecified: R51.9

## 2021-06-22 HISTORY — DX: Personal history of benign neoplasm of the brain: Z86.011

## 2021-06-22 HISTORY — DX: Other specified chronic obstructive pulmonary disease: J44.89

## 2021-06-22 SURGERY — EXAM UNDER ANESTHESIA, RECTUM
Anesthesia: Monitor Anesthesia Care | Site: Rectum

## 2021-06-22 MED ORDER — MIDAZOLAM HCL 2 MG/2ML IJ SOLN
INTRAMUSCULAR | Status: AC
  Filled 2021-06-22: qty 2

## 2021-06-22 MED ORDER — ACETAMINOPHEN 500 MG PO TABS
ORAL_TABLET | ORAL | Status: AC
  Filled 2021-06-22: qty 2

## 2021-06-22 MED ORDER — PROPOFOL 10 MG/ML IV BOLUS
INTRAVENOUS | Status: AC
  Filled 2021-06-22: qty 20

## 2021-06-22 MED ORDER — LIDOCAINE HCL (CARDIAC) PF 100 MG/5ML IV SOSY
PREFILLED_SYRINGE | INTRAVENOUS | Status: DC | PRN
  Administered 2021-06-22: 50 mg via INTRAVENOUS

## 2021-06-22 MED ORDER — ONDANSETRON HCL 4 MG/2ML IJ SOLN
INTRAMUSCULAR | Status: DC | PRN
  Administered 2021-06-22: 4 mg via INTRAVENOUS

## 2021-06-22 MED ORDER — 0.9 % SODIUM CHLORIDE (POUR BTL) OPTIME
TOPICAL | Status: DC | PRN
  Administered 2021-06-22: 500 mL

## 2021-06-22 MED ORDER — ONDANSETRON HCL 4 MG/2ML IJ SOLN
INTRAMUSCULAR | Status: AC
  Filled 2021-06-22: qty 2

## 2021-06-22 MED ORDER — BUPIVACAINE-EPINEPHRINE 0.5% -1:200000 IJ SOLN
INTRAMUSCULAR | Status: DC | PRN
  Administered 2021-06-22: 30 mL

## 2021-06-22 MED ORDER — SODIUM CHLORIDE 0.9% FLUSH: 3.0000 mL | Freq: Two times a day (BID) | INTRAVENOUS | Status: DC

## 2021-06-22 MED ORDER — LACTATED RINGERS IV SOLN: INTRAVENOUS | Status: DC

## 2021-06-22 MED ORDER — DEXMEDETOMIDINE (PRECEDEX) IN NS 20 MCG/5ML (4 MCG/ML) IV SYRINGE
PREFILLED_SYRINGE | INTRAVENOUS | Status: AC
  Filled 2021-06-22: qty 5

## 2021-06-22 MED ORDER — LIDOCAINE 5 % EX OINT
TOPICAL_OINTMENT | CUTANEOUS | Status: DC | PRN
  Administered 2021-06-22: 1

## 2021-06-22 MED ORDER — ACETAMINOPHEN 500 MG PO TABS
1000.0000 mg | ORAL_TABLET | ORAL | Status: AC
  Administered 2021-06-22: 1000 mg via ORAL

## 2021-06-22 MED ORDER — OXYCODONE HCL 5 MG PO TABS: 5.0000 mg | ORAL_TABLET | Freq: Four times a day (QID) | ORAL | 0 refills | Status: DC | PRN

## 2021-06-22 MED ORDER — FENTANYL CITRATE (PF) 100 MCG/2ML IJ SOLN
INTRAMUSCULAR | Status: AC
  Filled 2021-06-22: qty 2

## 2021-06-22 MED ORDER — DEXMEDETOMIDINE (PRECEDEX) IN NS 20 MCG/5ML (4 MCG/ML) IV SYRINGE
PREFILLED_SYRINGE | INTRAVENOUS | Status: DC | PRN
  Administered 2021-06-22 (×2): 8 ug via INTRAVENOUS
  Administered 2021-06-22 (×2): 4 ug via INTRAVENOUS

## 2021-06-22 MED ORDER — FENTANYL CITRATE (PF) 100 MCG/2ML IJ SOLN
INTRAMUSCULAR | Status: DC | PRN
  Administered 2021-06-22: 25 ug via INTRAVENOUS

## 2021-06-22 MED ORDER — GLYCOPYRROLATE PF 0.2 MG/ML IJ SOSY
PREFILLED_SYRINGE | INTRAMUSCULAR | Status: DC | PRN
  Administered 2021-06-22: .2 mg via INTRAVENOUS

## 2021-06-22 MED ORDER — MIDAZOLAM HCL 5 MG/5ML IJ SOLN
INTRAMUSCULAR | Status: DC | PRN
  Administered 2021-06-22: 2 mg via INTRAVENOUS

## 2021-06-22 MED ORDER — PROPOFOL 500 MG/50ML IV EMUL
INTRAVENOUS | Status: DC | PRN
  Administered 2021-06-22: 150 ug/kg/min via INTRAVENOUS
  Administered 2021-06-22: 20 mg via INTRAVENOUS

## 2021-06-22 MED ORDER — PROPOFOL 10 MG/ML IV BOLUS
INTRAVENOUS | Status: DC | PRN
  Administered 2021-06-22 (×2): 20 mg via INTRAVENOUS

## 2021-06-22 MED ORDER — LIDOCAINE HCL (PF) 2 % IJ SOLN
INTRAMUSCULAR | Status: AC
  Filled 2021-06-22: qty 5

## 2021-06-22 MED ORDER — FENTANYL CITRATE (PF) 100 MCG/2ML IJ SOLN: 25.0000 ug | INTRAMUSCULAR | Status: DC | PRN

## 2021-06-22 SURGICAL SUPPLY — 49 items
APL SKNCLS STERI-STRIP NONHPOA (GAUZE/BANDAGES/DRESSINGS) ×4
BENZOIN TINCTURE PRP APPL 2/3 (GAUZE/BANDAGES/DRESSINGS) ×6 IMPLANT
BLADE EXTENDED COATED 6.5IN (ELECTRODE) IMPLANT
BLADE HEX COATED 2.75 (ELECTRODE) ×3 IMPLANT
BLADE SURG 10 STRL SS (BLADE) IMPLANT
COVER BACK TABLE 60X90IN (DRAPES) ×3 IMPLANT
COVER MAYO STAND STRL (DRAPES) ×3 IMPLANT
DECANTER SPIKE VIAL GLASS SM (MISCELLANEOUS) ×3 IMPLANT
DRAPE LAPAROTOMY 100X72 PEDS (DRAPES) ×3 IMPLANT
DRAPE UTILITY XL STRL (DRAPES) ×3 IMPLANT
DRSG PAD ABDOMINAL 8X10 ST (GAUZE/BANDAGES/DRESSINGS) ×3 IMPLANT
ELECT REM PT RETURN 9FT ADLT (ELECTROSURGICAL) ×3
ELECTRODE REM PT RTRN 9FT ADLT (ELECTROSURGICAL) ×2 IMPLANT
GAUZE 4X4 16PLY ~~LOC~~+RFID DBL (SPONGE) ×3 IMPLANT
GAUZE SPONGE 4X4 12PLY STRL (GAUZE/BANDAGES/DRESSINGS) ×3 IMPLANT
GLOVE SURG ENC MOIS LTX SZ6.5 (GLOVE) ×6 IMPLANT
GLOVE SURG UNDER POLY LF SZ7 (GLOVE) ×6 IMPLANT
GOWN STRL REUS W/TWL XL LVL3 (GOWN DISPOSABLE) ×3 IMPLANT
HYDROGEN PEROXIDE 16OZ (MISCELLANEOUS) ×3 IMPLANT
IV CATH 14GX2 1/4 (CATHETERS) ×6 IMPLANT
IV CATH 18G SAFETY (IV SOLUTION) ×3 IMPLANT
KIT SIGMOIDOSCOPE (SET/KITS/TRAYS/PACK) IMPLANT
KIT TURNOVER CYSTO (KITS) ×3 IMPLANT
LOOP VESSEL MAXI BLUE (MISCELLANEOUS) ×3 IMPLANT
NEEDLE HYPO 22GX1.5 SAFETY (NEEDLE) ×3 IMPLANT
NS IRRIG 500ML POUR BTL (IV SOLUTION) ×3 IMPLANT
PACK BASIN DAY SURGERY FS (CUSTOM PROCEDURE TRAY) ×3 IMPLANT
PAD ARMBOARD 7.5X6 YLW CONV (MISCELLANEOUS) ×3 IMPLANT
PANTS MESH DISP LRG (UNDERPADS AND DIAPERS) ×2 IMPLANT
PANTS MESH DISPOSABLE L (UNDERPADS AND DIAPERS) ×1
PENCIL SMOKE EVACUATOR (MISCELLANEOUS) ×3 IMPLANT
SPONGE HEMORRHOID 8X3CM (HEMOSTASIS) IMPLANT
SPONGE SURGIFOAM ABS GEL 12-7 (HEMOSTASIS) IMPLANT
SUCTION FRAZIER HANDLE 10FR (MISCELLANEOUS)
SUCTION TUBE FRAZIER 10FR DISP (MISCELLANEOUS) IMPLANT
SUT CHROMIC 2 0 SH (SUTURE) IMPLANT
SUT CHROMIC 3 0 SH 27 (SUTURE) IMPLANT
SUT ETHIBOND 0 (SUTURE) ×6 IMPLANT
SUT VIC AB 2-0 SH 27 (SUTURE)
SUT VIC AB 2-0 SH 27XBRD (SUTURE) IMPLANT
SUT VIC AB 3-0 SH 18 (SUTURE) IMPLANT
SUT VIC AB 3-0 SH 27 (SUTURE)
SUT VIC AB 3-0 SH 27XBRD (SUTURE) IMPLANT
SYR 10ML LL (SYRINGE) ×3 IMPLANT
SYR CONTROL 10ML LL (SYRINGE) ×3 IMPLANT
TOWEL OR 17X26 10 PK STRL BLUE (TOWEL DISPOSABLE) ×3 IMPLANT
TRAY DSU PREP LF (CUSTOM PROCEDURE TRAY) ×3 IMPLANT
TUBE CONNECTING 12X1/4 (SUCTIONS) ×3 IMPLANT
YANKAUER SUCT BULB TIP NO VENT (SUCTIONS) ×3 IMPLANT

## 2021-06-22 NOTE — Anesthesia Preprocedure Evaluation (Addendum)
Anesthesia Evaluation  Patient identified by MRN, date of birth, ID band Patient awake    Reviewed: Allergy & Precautions, NPO status , Patient's Chart, lab work & pertinent test results  Airway Mallampati: III  TM Distance: >3 FB Neck ROM: Full  Mouth opening: Limited Mouth Opening  Dental no notable dental hx. (+) Teeth Intact, Dental Advisory Given   Pulmonary asthma , COPD, Current Smoker and Patient abstained from smoking.,    Pulmonary exam normal breath sounds clear to auscultation       Cardiovascular negative cardio ROS Normal cardiovascular exam Rhythm:Regular Rate:Normal     Neuro/Psych  Headaches, PSYCHIATRIC DISORDERS Anxiety    GI/Hepatic negative GI ROS, Neg liver ROS,   Endo/Other  negative endocrine ROS  Renal/GU negative Renal ROS  negative genitourinary   Musculoskeletal  (+) Arthritis , Rheumatoid disorders,  Fibromyalgia -  Abdominal   Peds  Hematology  (+) HIV,   Anesthesia Other Findings   Reproductive/Obstetrics                            Anesthesia Physical Anesthesia Plan  ASA: 3  Anesthesia Plan: MAC   Post-op Pain Management:    Induction: Intravenous  PONV Risk Score and Plan: 0 and Propofol infusion, Treatment may vary due to age or medical condition, Midazolam, Ondansetron and Dexamethasone  Airway Management Planned: Natural Airway  Additional Equipment:   Intra-op Plan:   Post-operative Plan:   Informed Consent: I have reviewed the patients History and Physical, chart, labs and discussed the procedure including the risks, benefits and alternatives for the proposed anesthesia with the patient or authorized representative who has indicated his/her understanding and acceptance.     Dental advisory given  Plan Discussed with: CRNA  Anesthesia Plan Comments:         Anesthesia Quick Evaluation

## 2021-06-22 NOTE — Discharge Instructions (Addendum)
ANORECTAL SURGERY: POST OP INSTRUCTIONS Take your usually prescribed home medications unless otherwise directed. DIET: During the first few hours after surgery sip on some liquids until you are able to urinate.  It is normal to not urinate for several hours after this surgery.  If you feel uncomfortable, please contact the office for instructions.  After you are able to urinate,you may eat, if you feel like it.  Follow a light bland diet the first 24 hours after arrival home, such as soup, liquids, crackers, etc.  Be sure to include lots of fluids daily (6-8 glasses).  Avoid fast food or heavy meals, as your are more likely to get nauseated.  Eat a low fat diet the next few days after surgery.  Limit caffeine intake to 1-2 servings a day. PAIN CONTROL: Pain is best controlled by a usual combination of several different methods TOGETHER: Muscle relaxation: Soak in a warm bath (or Sitz bath) three times a day and after bowel movements.  Continue to do this until all pain is resolved. Over the counter pain medication Prescription pain medication Most patients will experience some swelling and discomfort in the anus/rectal area and incisions.  Heat such as warm towels, sitz baths, warm baths, etc to help relax tight/sore spots and speed recovery.  Some people prefer to use ice, especially in the first couple days after surgery, as it may decrease the pain and swelling, or alternate between ice & heat.  Experiment to what works for you.  Swelling and bruising can take several weeks to resolve.  Pain can take even longer to completely resolve. It is helpful to take an over-the-counter pain medication regularly for the first few weeks.  Choose one of the following that works best for you: Naproxen (Aleve, etc)  Two 220mg tabs twice a day Ibuprofen (Advil, etc) Three 200mg tabs four times a day (every meal & bedtime) A  prescription for pain medication (such as percocet, oxycodone, hydrocodone, etc) should be  given to you upon discharge.  Take your pain medication as prescribed.  If you are having problems/concerns with the prescription medicine (does not control pain, nausea, vomiting, rash, itching, etc), please call us [phone] to see if we need to switch you to a different pain medicine that will work better for you and/or control your side effect better. If you need a refill on your pain medication, please contact your pharmacy.  They will contact our office to request authorization. Prescriptions will not be filled after 5 pm or on week-ends. KEEP YOUR BOWELS REGULAR and AVOID CONSTIPATION The goal is one to two soft bowel movements a day.  You should at least have a bowel movement every other day. Avoid getting constipated.  Between the surgery and the pain medications, it is common to experience some constipation. This can be very painful after rectal surgery.  Increasing fluid intake and taking a fiber supplement (such as Metamucil, Citrucel, FiberCon, etc) 1-2 times a day regularly will usually help prevent this problem from occurring.  A stool softener like colace is also recommended.  This can be purchased over the counter at your pharmacy.  You can take it up to 3 times a day.  If you do not have a bowel movement after 24 hrs since your surgery, take one does of milk of magnesia.  If you still haven't had a bowel movement 8-12 hours after that dose, take another dose.  If you don't have a bowel movement 48 hrs after surgery,   purchase a Fleets enema from the drug store and administer gently per package instructions.  If you still are having trouble with your bowel movements after that, please call the office for further instructions. If you develop diarrhea or have many loose bowel movements, simplify your diet to bland foods & liquids for a few days.  Stop any stool softeners and decrease your fiber supplement.  Switching to mild anti-diarrheal medications (Kayopectate, Pepto Bismol) can help.   If this worsens or does not improve, please call us.  Wound Care Remove your bandages before your first bowel movement or 8 hours after surgery.     Remove any wound packing material at this tim,e as well.  You do not need to repack the wound unless instructed otherwise.  Wear an absorbent pad or soft cotton gauze in your underwear to catch any drainage and help keep the area clean. You should change this every 2-3 hours while awake. Keep the area clean and dry.  Bathe / shower every day, especially after bowel movements.  Keep the area clean by showering / bathing over the incision / wound.   It is okay to soak an open wound to help wash it.  Wet wipes or showers / gentle washing after bowel movements is often less traumatic than regular toilet paper. You may have some styrofoam-like soft packing in the rectum which will come out with the first bowel movement.  You will often notice bleeding with bowel movements.  This should slow down by the end of the first week of surgery Expect some drainage.  This should slow down, too, by the end of the first week of surgery.  Wear an absorbent pad or soft cotton gauze in your underwear until the drainage stops. Do Not sit on a rubber or pillow ring.  This can make you symptoms worse.  You may sit on a soft pillow if needed.  ACTIVITIES as tolerated:   You may resume regular (light) daily activities beginning the next day--such as daily self-care, walking, climbing stairs--gradually increasing activities as tolerated.  If you can walk 30 minutes without difficulty, it is safe to try more intense activity such as jogging, treadmill, bicycling, low-impact aerobics, swimming, etc. Save the most intensive and strenuous activity for last such as sit-ups, heavy lifting, contact sports, etc  Refrain from any heavy lifting or straining until you are off narcotics for pain control.   You may drive when you are no longer taking prescription pain medication, you can  comfortably sit for long periods of time, and you can safely maneuver your car and apply brakes. You may have sexual intercourse when it is comfortable.  FOLLOW UP in our office Please call [facility] at [phone] to set up an appointment to see your surgeon in the office for a follow-up appointment approximately 3-4 weeks after your surgery. Make sure that you call for this appointment the day you arrive home to insure a convenient appointment time. 10. IF YOU HAVE DISABILITY OR FAMILY LEAVE FORMS, BRING THEM TO THE OFFICE FOR PROCESSING.  DO NOT GIVE THEM TO YOUR DOCTOR.     WHEN TO CALL us [phone]: Poor pain control Reactions / problems with new medications (rash/itching, nausea, etc)  Fever over 101.5 F (38.5 C) Inability to urinate Nausea and/or vomiting Worsening swelling or bruising Continued bleeding from incision. Increased pain, redness, or drainage from the incision  The clinic staff is available to answer your questions during regular business hours (8:30am-5pm).  Please don't hesitate to call and ask to speak to one of our nurses for clinical concerns.   A surgeon from [facility] is always on call at the hospitals   If you have a medical emergency, go to the nearest emergency room or call 911.    [facility], [address], [place], [place], [place]  [address] ? MAIN: [phone] ? TOLL FREE: [phone] ? FAX [phone] [email or web address]   **No acetaminophen or Tylenol until after 2:30pm today if needed.   Post Anesthesia Home Care Instructions  Activity: Get plenty of rest for the remainder of the day. A responsible individual must stay with you for 24 hours following the procedure.  For the next 24 hours, DO NOT: -Drive a car -Paediatric nurse -Drink alcoholic beverages -Take any medication unless instructed by your physician -Make any legal decisions or sign important  papers.  Meals: Start with liquid foods such as gelatin or soup. Progress to regular foods as tolerated. Avoid greasy, spicy, heavy foods. If nausea and/or vomiting occur, drink only clear liquids until the nausea and/or vomiting subsides. Call your physician if vomiting continues.  Special Instructions/Symptoms: Your throat may feel dry or sore from the anesthesia or the breathing tube placed in your throat during surgery. If this causes discomfort, gargle with warm salt water. The discomfort should disappear within 24 hours.  If you had a scopolamine patch placed behind your ear for the management of post- operative nausea and/or vomiting:  1. The medication in the patch is effective for 72 hours, after which it should be removed.  Wrap patch in a tissue and discard in the trash. Wash hands thoroughly with soap and water. 2. You may remove the patch earlier than 72 hours if you experience unpleasant side effects which may include dry mouth, dizziness or visual disturbances. 3. Avoid touching the patch. Wash your hands with soap and water after contact with the patch.

## 2021-06-22 NOTE — Anesthesia Procedure Notes (Signed)
Procedure Name: MAC Date/Time: 06/22/2021 11:25 AM Performed by: Mechele Claude, CRNA Pre-anesthesia Checklist: Patient identified, Emergency Drugs available, Suction available, Patient being monitored and Timeout performed Oxygen Delivery Method: Simple face mask Placement Confirmation: positive ETCO2 and breath sounds checked- equal and bilateral

## 2021-06-22 NOTE — Anesthesia Postprocedure Evaluation (Signed)
Anesthesia Post Note  Patient: Jonathan Rangel  Procedure(s) Performed: RECTAL EXAM UNDER ANESTHESIA incision and drainage of abcess (Rectum)     Patient location during evaluation: PACU Anesthesia Type: MAC Level of consciousness: awake and alert Pain management: pain level controlled Vital Signs Assessment: post-procedure vital signs reviewed and stable Respiratory status: spontaneous breathing, nonlabored ventilation, respiratory function stable and patient connected to nasal cannula oxygen Cardiovascular status: stable and blood pressure returned to baseline Postop Assessment: no apparent nausea or vomiting Anesthetic complications: no   No notable events documented.  Last Vitals:  Vitals:   06/22/21 1230 06/22/21 1255  BP: 133/84 (!) 133/92  Pulse: 75 (!) 59  Resp: 17 16  Temp: 36.4 C 36.4 C  SpO2: 98% 99%    Last Pain:  Vitals:   06/22/21 1330  TempSrc:   PainSc: 0-No pain                 Adina Puzzo L [place] People

## 2021-06-22 NOTE — Transfer of Care (Signed)
Immediate Anesthesia Transfer of Care Note  Patient: Jonathan Rangel  Procedure(s) Performed: Procedure(s) (LRB): RECTAL EXAM UNDER ANESTHESIA (N/A) incision and drainage of abcess (N/A)  Patient Location: PACU  Anesthesia Type: MAC  Level of Consciousness: awake, alert  and oriented  Airway & Oxygen Therapy: Patient Spontanous Breathing and Patient connected to face mask oxygen  Post-op Assessment: Report given to PACU RN and Post -op Vital signs reviewed and stable  Post vital signs: Reviewed and stable  Complications: No apparent anesthesia complications  Last Vitals:  Vitals Value Taken Time  BP 106/63 06/22/21 1201  Temp    Pulse 80 06/22/21 1205  Resp 17 06/22/21 1205  SpO2 100 % 06/22/21 1205  Vitals shown include unvalidated device data.  Last Pain:  Vitals:   06/22/21 0831  TempSrc: Oral         Complications: No notable events documented.

## 2021-06-22 NOTE — Interval H&P Note (Signed)
History and Physical Interval Note:  06/22/2021 8:30 AM  Jonathan Rangel  has presented today for surgery, with the diagnosis of ANAL FISTULA, RECURRENT.  The various methods of treatment have been discussed with the patient and family. After consideration of risks, benefits and other options for treatment, the patient has consented to  Procedure(s): RECTAL EXAM UNDER ANESTHESIA (N/A) PLACEMENT OF SETON (N/A) as a surgical intervention.  The patient's history has been reviewed, patient examined, no change in status, stable for surgery.  I have reviewed the patient's chart and labs.  Questions were answered to the patient's satisfaction.     Rosario Adie, MD  [facility]

## 2021-06-22 NOTE — Op Note (Signed)
06/22/2021  11:51 AM  PATIENT:  Jonathan Rangel  45 y.o. male  Patient Care Team: Madalyn Rob, MD as PCP - General Doren Custard Melton Krebs, NP as Nurse Practitioner (Infectious Diseases) Leandrew Koyanagi, MD as Attending Physician (Orthopedic Surgery) Izora Ribas, MD as Consulting Physician (Physical Medicine and Rehabilitation)  PRE-OPERATIVE DIAGNOSIS:  ANAL FISTULA, RECURRENT  POST-OPERATIVE DIAGNOSIS:  ANAL ABSCESS  PROCEDURE:  RECTAL EXAM UNDER ANESTHESIA incision and drainage of abcess   Surgeon(s): Leighton Ruff, MD  ASSISTANT: Zacarias Pontes. MD   ANESTHESIA:   local and MAC  SPECIMEN:  No Specimen  DISPOSITION OF SPECIMEN:  N/A  COUNTS:  YES  PLAN OF CARE: Discharge to home after PACU  PATIENT DISPOSITION:  PACU - hemodynamically stable.  INDICATION: 45 y.o. M s/p fistulotomy with recurrent perianal drainage.  He was felt to have a recurrent anal fistula.   OR FINDINGS: Patient appeared to have a chronic draining sinus arising from underneath his fistulotomy tract to the skin surface.  There was no communication with the anal canal  DESCRIPTION: the patient was identified in the preoperative holding area and taken to the OR where they were laid on the operating room table.  MAC anesthesia was induced without difficulty. The patient was then positioned in prone jackknife position with buttocks gently taped apart.  The patient was then prepped and draped in usual sterile fashion.  SCDs were noted to be in place prior to the initiation of anesthesia. A surgical timeout was performed indicating the correct patient, procedure, positioning and need for preoperative antibiotics.  A rectal block was performed using Marcaine with epinephrine.    I began with a digital rectal exam.  The patient does have some mild decrease sphincter tone.  I then placed a Hill-Ferguson anoscope into the anal canal and evaluated this completely.  Patient has minimal hemorrhoid disease.   Fistulotomy site noted at left anterior.  This appeared to involve a small amount of the internal sphincter.   I placed an S shaped fistula probe into the external opening.  This traversed towards his fistulotomy scar but then ended in what appeared to be a blind pouch.  There was no further tract noted.  I opened the area around the tract at the skin level using electrocautery.  Scar tissue and granulation tissue was noted.  I still was unable to find a fistula tract traversing the anal canal.  I confirmed this by injecting hydrogen peroxide into the area.  There was no tract noted exiting internally.  This was felt to be a blind end and possibly due to previous infection or foreign body reaction from his prior surgery.  I excised some scar tissue and granulation tissue using electrocautery.  No sphincter complex was divided.  I left the skin open to allow the wound to heal from secondary intention.  Hemostasis was good.  A dressing was applied.  The patient was awakened from anesthesia and sent to the post anesthesia care unit in stable condition.  All counts were correct per operating room staff.

## 2021-06-23 ENCOUNTER — Encounter (HOSPITAL_BASED_OUTPATIENT_CLINIC_OR_DEPARTMENT_OTHER): Payer: Self-pay | Admitting: General Surgery

## 2021-06-29 ENCOUNTER — Ambulatory Visit: Payer: Medicare HMO | Admitting: Physical Therapy

## 2021-07-06 ENCOUNTER — Ambulatory Visit: Payer: Medicare HMO | Admitting: Physical Therapy

## 2021-07-13 ENCOUNTER — Other Ambulatory Visit: Payer: Self-pay

## 2021-07-13 ENCOUNTER — Ambulatory Visit: Payer: Medicare HMO | Admitting: Physical Therapy

## 2021-07-13 ENCOUNTER — Encounter: Payer: Self-pay | Admitting: Physical Therapy

## 2021-07-13 DIAGNOSIS — M542 Cervicalgia: Secondary | ICD-10-CM | POA: Diagnosis not present

## 2021-07-13 DIAGNOSIS — R293 Abnormal posture: Secondary | ICD-10-CM | POA: Diagnosis not present

## 2021-07-13 DIAGNOSIS — M6281 Muscle weakness (generalized): Secondary | ICD-10-CM | POA: Diagnosis not present

## 2021-07-13 DIAGNOSIS — M25612 Stiffness of left shoulder, not elsewhere classified: Secondary | ICD-10-CM

## 2021-07-13 DIAGNOSIS — R209 Unspecified disturbances of skin sensation: Secondary | ICD-10-CM | POA: Diagnosis not present

## 2021-07-13 DIAGNOSIS — M25611 Stiffness of right shoulder, not elsewhere classified: Secondary | ICD-10-CM | POA: Diagnosis not present

## 2021-07-13 NOTE — Therapy (Signed)
[address] [place], [place], [address] Phone: [phone]   Fax:  [phone]  Physical Therapy Treatment  Patient Details  Name: Jonathan Rangel MRN: [id number] Date of Birth: 12/15/1975 Referring Provider (PT): Ranell Patrick, Clide Deutscher, MD - [facility] - PM[facility]R   Encounter Date: 07/13/2021   PT End of Session - 07/13/21 1509     Visit Number 3    Date for PT Re-Evaluation 08/12/21    PT Start Time 1430    PT Stop Time 1520    PT Time Calculation (min) 50 min    Activity Tolerance Patient tolerated treatment well;Patient limited by pain    Behavior During Therapy [facility] for tasks assessed/performed             Past Medical History:  Diagnosis Date   Anal fistula    Asthma with COPD (chronic obstructive pulmonary disease) ([place])    followed by pcp   Chronic headache disorder    residual from benign neoplasm cerebral meninges post gamma knife 2016   Chronic pain    followed by pain clinic   Eczema    Fibromyalgia    History of Bell's palsy    per pt 1997 and 2015 both time right side , in 2015 found to have meninges tumor   History of benign neoplasm of cerebral meninges    s/p  gamma knife 05-28-2015  right temporal lobe   HIV (human immunodeficiency virus infection) ([place]) 2012   followed by Janene Madeira NP @ [facility]   RA (rheumatoid arthritis) ([place])    per pt was dx approx 2008,  followed by pain clinic , does not have a rheumatologist    Past Surgical History:  Procedure Laterality Date   ANAL FISTULOTOMY  2004   [place] Right 2014   CHOLECYSTECTOMY, LAPAROSCOPIC  2009   COLONOSCOPY  2015   patient reported normal    PLACEMENT OF SETON N/A 06/22/2021   Procedure: incision and drainage of abcess;  Surgeon: Leighton Ruff, MD;  Location: [facility];  Service: General;  Laterality: N/A;   RECTAL EXAM UNDER ANESTHESIA N/A 06/22/2021   Procedure: RECTAL EXAM UNDER ANESTHESIA;   Surgeon: Leighton Ruff, MD;  Location: [facility];  Service: General;  Laterality: N/A;   Stereotactic destruction of lesion using gamma radiation  05/28/2015   right temporal lobe ;  in [place]    There were no vitals filed for this visit.   Subjective Assessment - 07/13/21 1435     Subjective "My back killing me today" Neck is doing ok    Pertinent History per chart review: HIV, benign meningeal neoplasms, asthma,  headaches.  fibromyalgia neuropathy and RA (pt reported)    Currently in Pain? Yes    Pain Score 8     Pain Location Neck                               [facility] Adult PT Treatment/Exercise - 07/13/21 0001       Neck Exercises: Machines for Strengthening   UBE (Upper Arm Bike) L 1 2 min fwd/2 min back      Neck Exercises: Standing   Other Standing Exercises Green tband shld ext,row and ER 2x10    Other Standing Exercises Shoulder Flex & abd 4lb 2x10      Neck Exercises: Seated  Neck Retraction 20 reps;3 secs      Modalities   Modalities Moist Heat;Electrical Stimulation      Moist Heat Therapy   Number Minutes Moist Heat 12 Minutes    Moist Heat Location Lumbar Spine      Electrical Stimulation   Electrical Stimulation Location cerv & lumbar    Electrical Stimulation Action Pre mod in prone    Electrical Stimulation Parameters topt tolerance    Electrical Stimulation Goals Pain                      PT Short Term Goals - 06/21/21 1300       PT SHORT TERM GOAL #1   Title independent with initial HEP    Status Achieved               PT Long Term Goals - 06/17/21 1022       PT LONG TERM GOAL #1   Title Independent with advanced HEP    Time 8    Period Weeks    Status New    Target Date 08/12/21      PT LONG TERM GOAL #2   Title Pt will report  at least 50% improvement in neck pain and symptoms    Time 8    Period Weeks    Status New    Target Date 08/12/21       PT LONG TERM GOAL #3   Title Pt will report improved ability to complete meal prep and hygiene activities less limtied by pain    Time 8    Period Weeks    Status New    Target Date 08/12/21      PT LONG TERM GOAL #4   Title Pt wil dmeonstrate improved cervical and shoulder ROM to full and symmetrical with </= 4/10 pain less frequently.    Time 8    Period Weeks    Status New    Target Date 08/12/21      PT LONG TERM GOAL #5   Title BUE and postural strength to at least 4+/5    Time 8    Period Weeks    Status New    Target Date 08/12/21                   Plan - 07/13/21 1509     Clinical Impression Statement Pt enters clinic reporting that his back pain superceded his neck pain Postural cue needed with all standing interventions Tactile cue to retract shoulders with shoulder external rotation. Pt stated that he could feel the back of his neck from shoulder retractions. Lead PT assisted in treatment with DN.    Personal Factors and Comorbidities Comorbidity 3+;Time since onset of injury/illness/exacerbation;Past/Current Experience    Examination-Activity Limitations Bathing;Reach Overhead;Carry;Dressing;Hygiene/Grooming;Toileting    Examination-Participation Restrictions Meal Prep;Cleaning;Driving;Laundry;Shop    PT Duration 8 weeks    PT Treatment/Interventions ADLs/Self Care Home Management;Cryotherapy;Electrical Stimulation;Moist Heat;Traction;Neuromuscular re-education;Balance training;Therapeutic exercise;Therapeutic activities;Functional mobility training;Patient/family education;Manual techniques;Taping    PT Next Visit Plan assess response to tx and progress             Patient will benefit from skilled therapeutic intervention in order to improve the following deficits and impairments:  Decreased range of motion, Increased fascial restricitons, Impaired UE functional use, Increased muscle spasms, Decreased endurance, Pain, Improper body mechanics,  Impaired flexibility, Decreased mobility, Decreased strength, Impaired sensation  Visit Diagnosis: Stiffness of left shoulder, not elsewhere classified  Cervicalgia  Muscle weakness (generalized)     Problem List Patient Active Problem List   Diagnosis Date Noted   Elevated blood pressure reading 03/16/2021   Blurry vision, bilateral 02/15/2021   Neck pain 12/14/2020   Paresthesia 12/14/2020   Dyshidrotic eczema 09/29/2020   Pruritic rash 09/29/2020   Seasonal allergies 03/31/2020   Esophageal dysphagia 03/31/2020   Abdominal pain 10/16/2019   Anxiety 01/23/2019   Anal fistula 10/22/2018   Healthcare maintenance 07/30/2018   Amphetamine and psychostimulant-induced psychosis with delusions ([place]) 05/21/2018   Substance induced mood disorder ([place]) 01/24/2018   Abnormal liver function 11/23/2017   Chronic pain    HIV (human immunodeficiency virus infection) ([place]) 11/20/2017   Rheumatoid arthritis ([place]) 11/20/2017   Fibromyalgia 11/20/2017   History of syphilis 01/14/2015    Scot Jun 07/13/2021, 3:12 PM  [address] [place], [place], [address] Phone: [phone]   Fax:  [phone]  Name: Jonathan Rangel MRN: [id number] Date of Birth: 1976-02-21

## 2021-07-14 ENCOUNTER — Other Ambulatory Visit: Payer: Self-pay | Admitting: Infectious Diseases

## 2021-07-14 DIAGNOSIS — Z21 Asymptomatic human immunodeficiency virus [HIV] infection status: Secondary | ICD-10-CM

## 2021-07-20 ENCOUNTER — Ambulatory Visit: Payer: Medicare HMO | Admitting: Physical Therapy

## 2021-07-20 ENCOUNTER — Telehealth: Payer: Self-pay

## 2021-07-20 MED ORDER — ONDANSETRON HCL 4 MG PO TABS: 4.0000 mg | ORAL_TABLET | Freq: Three times a day (TID) | ORAL | 2 refills | Status: AC | PRN

## 2021-07-20 NOTE — Telephone Encounter (Signed)
Refill sent in for him. Looks like he has a follow up with surgery team tomorrow.

## 2021-07-20 NOTE — Telephone Encounter (Signed)
Spoke with patient, he states he's always had problems with his stomach. He thought [facility] [place] was refilling the Zofran. Will route to provider.

## 2021-07-20 NOTE — Addendum Note (Signed)
Addended by: [place] Callas on: 07/20/2021 02:20 PM   Modules accepted: Orders

## 2021-07-20 NOTE — Telephone Encounter (Signed)
Called patient to notify him that refills were sent, no answer.   Beryle Flock, RN

## 2021-07-20 NOTE — Telephone Encounter (Signed)
Patient called to schedule his 6 month follow up with same day labs. Is requesting medication refill for Zofran, was unsure if Doren Custard was the one how proscribed it but wanted to know if she could fill it for him and send to the [facility] on [address], Patient best contact number is [phone]

## 2021-07-26 DIAGNOSIS — M5412 Radiculopathy, cervical region: Secondary | ICD-10-CM | POA: Diagnosis not present

## 2021-08-16 ENCOUNTER — Other Ambulatory Visit: Payer: Self-pay | Admitting: Infectious Diseases

## 2021-08-16 DIAGNOSIS — Z21 Asymptomatic human immunodeficiency virus [HIV] infection status: Secondary | ICD-10-CM

## 2021-08-17 DIAGNOSIS — M069 Rheumatoid arthritis, unspecified: Secondary | ICD-10-CM | POA: Diagnosis not present

## 2021-08-17 DIAGNOSIS — M5416 Radiculopathy, lumbar region: Secondary | ICD-10-CM | POA: Diagnosis not present

## 2021-08-26 DIAGNOSIS — Z79899 Other long term (current) drug therapy: Secondary | ICD-10-CM | POA: Diagnosis not present

## 2021-08-26 DIAGNOSIS — G629 Polyneuropathy, unspecified: Secondary | ICD-10-CM | POA: Diagnosis not present

## 2021-08-26 DIAGNOSIS — M79672 Pain in left foot: Secondary | ICD-10-CM | POA: Diagnosis not present

## 2021-08-26 DIAGNOSIS — Z21 Asymptomatic human immunodeficiency virus [HIV] infection status: Secondary | ICD-10-CM | POA: Diagnosis not present

## 2021-08-26 DIAGNOSIS — M2550 Pain in unspecified joint: Secondary | ICD-10-CM | POA: Diagnosis not present

## 2021-08-26 DIAGNOSIS — L309 Dermatitis, unspecified: Secondary | ICD-10-CM | POA: Diagnosis not present

## 2021-08-26 DIAGNOSIS — M1990 Unspecified osteoarthritis, unspecified site: Secondary | ICD-10-CM | POA: Diagnosis not present

## 2021-08-26 DIAGNOSIS — M797 Fibromyalgia: Secondary | ICD-10-CM | POA: Diagnosis not present

## 2021-08-26 DIAGNOSIS — M79641 Pain in right hand: Secondary | ICD-10-CM | POA: Diagnosis not present

## 2021-08-26 DIAGNOSIS — M79642 Pain in left hand: Secondary | ICD-10-CM | POA: Diagnosis not present

## 2021-08-26 DIAGNOSIS — M79671 Pain in right foot: Secondary | ICD-10-CM | POA: Diagnosis not present

## 2021-08-30 ENCOUNTER — Ambulatory Visit: Payer: Medicare HMO | Admitting: Infectious Diseases

## 2021-09-07 DIAGNOSIS — R6889 Other general symptoms and signs: Secondary | ICD-10-CM | POA: Diagnosis not present

## 2021-09-07 DIAGNOSIS — M1990 Unspecified osteoarthritis, unspecified site: Secondary | ICD-10-CM | POA: Diagnosis not present

## 2021-09-07 DIAGNOSIS — L309 Dermatitis, unspecified: Secondary | ICD-10-CM | POA: Diagnosis not present

## 2021-09-07 DIAGNOSIS — Z79899 Other long term (current) drug therapy: Secondary | ICD-10-CM | POA: Diagnosis not present

## 2021-09-07 DIAGNOSIS — G629 Polyneuropathy, unspecified: Secondary | ICD-10-CM | POA: Diagnosis not present

## 2021-09-07 DIAGNOSIS — Z21 Asymptomatic human immunodeficiency virus [HIV] infection status: Secondary | ICD-10-CM | POA: Diagnosis not present

## 2021-09-07 DIAGNOSIS — Z23 Encounter for immunization: Secondary | ICD-10-CM | POA: Diagnosis not present

## 2021-09-07 DIAGNOSIS — M797 Fibromyalgia: Secondary | ICD-10-CM | POA: Diagnosis not present

## 2021-09-13 DIAGNOSIS — M5412 Radiculopathy, cervical region: Secondary | ICD-10-CM | POA: Diagnosis not present

## 2021-09-13 DIAGNOSIS — M5416 Radiculopathy, lumbar region: Secondary | ICD-10-CM | POA: Diagnosis not present

## 2021-09-14 ENCOUNTER — Ambulatory Visit: Payer: Medicare HMO | Admitting: Physical Medicine and Rehabilitation

## 2021-09-15 ENCOUNTER — Other Ambulatory Visit: Payer: Self-pay | Admitting: Internal Medicine

## 2021-09-15 ENCOUNTER — Other Ambulatory Visit: Payer: Self-pay | Admitting: Infectious Diseases

## 2021-09-15 ENCOUNTER — Encounter: Payer: Medicare HMO | Attending: Physical Medicine and Rehabilitation | Admitting: Physical Medicine and Rehabilitation

## 2021-09-15 ENCOUNTER — Other Ambulatory Visit: Payer: Self-pay | Admitting: Student

## 2021-09-15 ENCOUNTER — Other Ambulatory Visit: Payer: Self-pay | Admitting: Physical Medicine and Rehabilitation

## 2021-09-15 DIAGNOSIS — M5416 Radiculopathy, lumbar region: Secondary | ICD-10-CM | POA: Diagnosis not present

## 2021-09-15 DIAGNOSIS — L301 Dyshidrosis [pompholyx]: Secondary | ICD-10-CM

## 2021-09-15 DIAGNOSIS — R0602 Shortness of breath: Secondary | ICD-10-CM

## 2021-09-15 DIAGNOSIS — F419 Anxiety disorder, unspecified: Secondary | ICD-10-CM

## 2021-09-15 DIAGNOSIS — Z21 Asymptomatic human immunodeficiency virus [HIV] infection status: Secondary | ICD-10-CM

## 2021-09-15 DIAGNOSIS — L282 Other prurigo: Secondary | ICD-10-CM

## 2021-09-15 DIAGNOSIS — J302 Other seasonal allergic rhinitis: Secondary | ICD-10-CM

## 2021-09-15 NOTE — Telephone Encounter (Signed)
Last office visit 03/16/21 with instructions to return in 56mth  Message sent to front office to assist with an appt  Refill request sent to pcp for review.

## 2021-09-15 NOTE — Telephone Encounter (Signed)
Last office visit 03/16/21 with instructions to return in 36mths Message sent to front office to assist with an appt  Refill request sent to pcp for review.

## 2021-09-15 NOTE — Telephone Encounter (Signed)
Last office visit 03/16/21 with instructions to return in 53mth  Message sent to front office to assist with an appt  Refill request sent to pcp for review.

## 2021-09-15 NOTE — Telephone Encounter (Signed)
Last office visit 03/16/21 with instructions to return in 24mth  Message sent to front office to assist with an appt  Refill request sent to pcp for review.

## 2021-09-16 ENCOUNTER — Other Ambulatory Visit: Payer: Self-pay | Admitting: *Deleted

## 2021-09-19 ENCOUNTER — Telehealth: Payer: Self-pay | Admitting: Physical Medicine and Rehabilitation

## 2021-09-19 NOTE — Telephone Encounter (Signed)
Patient states does not need trigger points he is currently taking steroids from his Rheumatology and doing injections there and he does not want to risk a "hump back" with over doing it.  States if you need to see him please let him know

## 2021-09-20 ENCOUNTER — Other Ambulatory Visit: Payer: Self-pay

## 2021-09-20 MED ORDER — GABAPENTIN 400 MG PO CAPS: ORAL_CAPSULE | ORAL | 1 refills | Status: DC

## 2021-10-17 ENCOUNTER — Ambulatory Visit (HOSPITAL_COMMUNITY)
Admission: RE | Admit: 2021-10-17 | Discharge: 2021-10-17 | Disposition: A | Discharge status: Home / Self Care | Payer: Medicare HMO | Source: Ambulatory Visit | Attending: Internal Medicine | Admitting: Internal Medicine

## 2021-10-17 ENCOUNTER — Encounter: Payer: Self-pay | Admitting: Internal Medicine

## 2021-10-17 ENCOUNTER — Other Ambulatory Visit: Payer: Self-pay

## 2021-10-17 ENCOUNTER — Ambulatory Visit (INDEPENDENT_AMBULATORY_CARE_PROVIDER_SITE_OTHER): Payer: Medicare HMO | Admitting: Internal Medicine

## 2021-10-17 VITALS — BP 100/69 | HR 71 | Temp 98.2°F | Ht 68.0 in | Wt 162.7 lb

## 2021-10-17 DIAGNOSIS — R1032 Left lower quadrant pain: Secondary | ICD-10-CM | POA: Insufficient documentation

## 2021-10-17 NOTE — Progress Notes (Signed)
   CC: abdominal pain  HPI:[name] is a 45 y.o. male who presents for evaluation of abdominal pain. Please see individual problem based A/P for details.   Past Medical History:  Diagnosis Date   Anal fistula    Asthma with COPD (chronic obstructive pulmonary disease) ([place])    followed by pcp   Chronic headache disorder    residual from benign neoplasm cerebral meninges post gamma knife 2016   Chronic pain    followed by pain clinic   Eczema    Fibromyalgia    History of Bell's palsy    per pt 1997 and 2015 both time right side , in 2015 found to have meninges tumor   History of benign neoplasm of cerebral meninges    s/p  gamma knife 05-28-2015  right temporal lobe   HIV (human immunodeficiency virus infection) ([place]) 2012   followed by stephanie dixon NP @ [facility]   RA (rheumatoid arthritis) ([place])    per pt was dx approx 2008,  followed by pain clinic , does not have a rheumatologist   Review of Systems:   Review of Systems  Constitutional:  Negative for chills and fever.  Gastrointestinal:  Positive for abdominal pain, constipation and vomiting. Negative for blood in stool.    Physical Exam: Vitals:   10/17/21 1446  BP: 100/69  Pulse: 71  Temp: 98.2 F (36.8 C)  TempSrc: Oral  SpO2: 100%  Weight: 162 lb 11.2 oz (73.8 kg)  Height: 5\' 8"  (1.727 m)   General: Appear uncomfortable, rubbing his left side Cardiovascular: Normal rate, regular rhythm.  No murmurs, rubs, or gallops Abdominal: hypoactive bowel sounds, not distended, no guarding, pain with palpation of left upper and left lower quadrant  Assessment & Plan:   See Encounters Tab for problem based charting.  Patient discussed with Dr. Daryll Drown

## 2021-10-17 NOTE — Patient Instructions (Signed)
Thank you for trusting me with your care. To recap, today we discussed the following:  Your abdominal xray does not show obstruction. This means your bowels are probably not moving normally.  I would like for you to only drink clear liquids for the next 1 - 2 days. I recommend walking as tolerated which will help stimulate bowel movements. If you worsen or start to vomit please go to the emergency room.

## 2021-10-18 ENCOUNTER — Observation Stay (HOSPITAL_COMMUNITY)
Admission: AD | Admit: 2021-10-18 | Discharge: 2021-10-19 | Disposition: A | Discharge status: Home / Self Care | Payer: Medicare HMO | Source: Ambulatory Visit | Attending: Student in an Organized Health Care Education/Training Program | Admitting: Student in an Organized Health Care Education/Training Program

## 2021-10-18 ENCOUNTER — Encounter: Payer: Self-pay | Admitting: Internal Medicine

## 2021-10-18 DIAGNOSIS — Z79899 Other long term (current) drug therapy: Secondary | ICD-10-CM | POA: Diagnosis not present

## 2021-10-18 DIAGNOSIS — R112 Nausea with vomiting, unspecified: Secondary | ICD-10-CM | POA: Diagnosis present

## 2021-10-18 DIAGNOSIS — B20 Human immunodeficiency virus [HIV] disease: Secondary | ICD-10-CM | POA: Diagnosis not present

## 2021-10-18 DIAGNOSIS — F17210 Nicotine dependence, cigarettes, uncomplicated: Secondary | ICD-10-CM | POA: Insufficient documentation

## 2021-10-18 DIAGNOSIS — K529 Noninfective gastroenteritis and colitis, unspecified: Secondary | ICD-10-CM | POA: Diagnosis not present

## 2021-10-18 DIAGNOSIS — K2970 Gastritis, unspecified, without bleeding: Secondary | ICD-10-CM | POA: Diagnosis present

## 2021-10-18 DIAGNOSIS — Z20822 Contact with and (suspected) exposure to covid-19: Secondary | ICD-10-CM | POA: Insufficient documentation

## 2021-10-18 DIAGNOSIS — K567 Ileus, unspecified: Secondary | ICD-10-CM | POA: Diagnosis present

## 2021-10-18 DIAGNOSIS — J45909 Unspecified asthma, uncomplicated: Secondary | ICD-10-CM | POA: Diagnosis not present

## 2021-10-18 DIAGNOSIS — M069 Rheumatoid arthritis, unspecified: Secondary | ICD-10-CM | POA: Diagnosis present

## 2021-10-18 DIAGNOSIS — R109 Unspecified abdominal pain: Secondary | ICD-10-CM | POA: Diagnosis present

## 2021-10-18 DIAGNOSIS — J449 Chronic obstructive pulmonary disease, unspecified: Secondary | ICD-10-CM | POA: Insufficient documentation

## 2021-10-18 DIAGNOSIS — Z21 Asymptomatic human immunodeficiency virus [HIV] infection status: Secondary | ICD-10-CM | POA: Diagnosis present

## 2021-10-18 LAB — COMPREHENSIVE METABOLIC PANEL
ALT: 15 U/L (ref 0–44)
AST: 15 U/L (ref 15–41)
Albumin: 3.5 g/dL (ref 3.5–5.0)
Alkaline Phosphatase: 67 U/L (ref 38–126)
Anion gap: 5 (ref 5–15)
BUN: 12 mg/dL (ref 6–20)
CO2: 26 mmol/L (ref 22–32)
Calcium: 8.9 mg/dL (ref 8.9–10.3)
Chloride: 104 mmol/L (ref 98–111)
Creatinine, Ser: 1.19 mg/dL (ref 0.61–1.24)
GFR, Estimated: >60 mL/min (ref >60)
Glucose, Bld: 87 mg/dL (ref 70–99)
Potassium: 3.7 mmol/L (ref 3.5–5.1)
Sodium: 135 mmol/L (ref 135–145)
Total Bilirubin: 0.3 mg/dL (ref 0.3–1.2)
Total Protein: 6.3 g/dL — ABNORMAL LOW (ref 6.5–8.1)

## 2021-10-18 LAB — RESP PANEL BY RT-PCR (FLU A&B, COVID) ARPGX2
Influenza A by PCR: NEGATIVE
Influenza B by PCR: NEGATIVE
SARS Coronavirus 2 by RT PCR: NEGATIVE

## 2021-10-18 LAB — CBC WITH DIFFERENTIAL/PLATELET
Abs Immature Granulocytes: 0.03 10*3/uL (ref 0.00–0.07)
Basophils Absolute: 0.1 10*3/uL (ref 0.0–0.1)
Basophils Relative: 1 %
Eosinophils Absolute: 0.2 10*3/uL (ref 0.0–0.5)
Eosinophils Relative: 2 %
HCT: 41 % (ref 39.0–52.0)
Hemoglobin: 13.9 g/dL (ref 13.0–17.0)
Immature Granulocytes: 0 %
Lymphocytes Relative: 47 %
Lymphs Abs: 4.4 10*3/uL — ABNORMAL HIGH (ref 0.7–4.0)
MCH: 31.4 pg (ref 26.0–34.0)
MCHC: 33.9 g/dL (ref 30.0–36.0)
MCV: 92.8 fL (ref 80.0–100.0)
Monocytes Absolute: 0.8 10*3/uL (ref 0.1–1.0)
Monocytes Relative: 9 %
Neutro Abs: 3.8 10*3/uL (ref 1.7–7.7)
Neutrophils Relative %: 41 %
Platelets: 270 10*3/uL (ref 150–400)
RBC: 4.42 MIL/uL (ref 4.22–5.81)
RDW: 13.5 % (ref 11.5–15.5)
WBC: 9.3 10*3/uL (ref 4.0–10.5)
nRBC: 0 % (ref 0.0–0.2)

## 2021-10-18 LAB — LIPASE, BLOOD: Lipase: 28 U/L (ref 11–51)

## 2021-10-18 MED ORDER — FLUOXETINE HCL 20 MG PO CAPS
60.0000 mg | ORAL_CAPSULE | Freq: Every day | ORAL | Status: DC
  Administered 2021-10-18 – 2021-10-19 (×2): 60 mg via ORAL
  Filled 2021-10-18 (×2): qty 3

## 2021-10-18 MED ORDER — MONTELUKAST SODIUM 10 MG PO TABS
10.0000 mg | ORAL_TABLET | Freq: Every day | ORAL | Status: DC
  Administered 2021-10-18: 10 mg via ORAL
  Filled 2021-10-18: qty 1

## 2021-10-18 MED ORDER — ONDANSETRON HCL 4 MG PO TABS: 4.0000 mg | ORAL_TABLET | Freq: Four times a day (QID) | ORAL | Status: DC | PRN

## 2021-10-18 MED ORDER — ACETAMINOPHEN 650 MG RE SUPP: 650.0000 mg | Freq: Four times a day (QID) | RECTAL | Status: DC | PRN

## 2021-10-18 MED ORDER — HYDROXYZINE HCL 25 MG PO TABS: 25.0000 mg | ORAL_TABLET | Freq: Three times a day (TID) | ORAL | Status: DC | PRN

## 2021-10-18 MED ORDER — ALBUTEROL SULFATE HFA 108 (90 BASE) MCG/ACT IN AERS
1.0000 | INHALATION_SPRAY | RESPIRATORY_TRACT | Status: DC | PRN
Start: 2021-10-18 — End: 2021-10-18

## 2021-10-18 MED ORDER — TRAZODONE HCL 100 MG PO TABS
100.0000 mg | ORAL_TABLET | Freq: Every evening | ORAL | Status: DC | PRN
  Administered 2021-10-18: 100 mg via ORAL
  Filled 2021-10-18: qty 1

## 2021-10-18 MED ORDER — ONDANSETRON HCL 4 MG/2ML IJ SOLN: 4.0000 mg | Freq: Four times a day (QID) | INTRAMUSCULAR | Status: DC | PRN

## 2021-10-18 MED ORDER — ACETAMINOPHEN 325 MG PO TABS: 650.0000 mg | ORAL_TABLET | Freq: Four times a day (QID) | ORAL | Status: DC | PRN

## 2021-10-18 MED ORDER — TRIAMCINOLONE ACETONIDE 0.5 % EX CREA
1.0000 "application " | TOPICAL_CREAM | Freq: Two times a day (BID) | CUTANEOUS | Status: DC
  Administered 2021-10-18: 1 via TOPICAL
  Filled 2021-10-18: qty 15

## 2021-10-18 MED ORDER — ALBUTEROL SULFATE (2.5 MG/3ML) 0.083% IN NEBU: 2.5000 mg | INHALATION_SOLUTION | Freq: Four times a day (QID) | RESPIRATORY_TRACT | Status: DC | PRN

## 2021-10-18 MED ORDER — ALBUTEROL SULFATE (2.5 MG/3ML) 0.083% IN NEBU: 2.5000 mg | INHALATION_SOLUTION | RESPIRATORY_TRACT | Status: DC | PRN

## 2021-10-18 MED ORDER — BICTEGRAVIR-EMTRICITAB-TENOFOV 50-200-25 MG PO TABS
1.0000 | ORAL_TABLET | Freq: Every day | ORAL | Status: DC
  Administered 2021-10-18 – 2021-10-19 (×2): 1 via ORAL
  Filled 2021-10-18 (×2): qty 1

## 2021-10-18 MED ORDER — ENOXAPARIN SODIUM 40 MG/0.4ML IJ SOSY
40.0000 mg | PREFILLED_SYRINGE | INTRAMUSCULAR | Status: DC
  Administered 2021-10-18: 40 mg via SUBCUTANEOUS
  Filled 2021-10-18: qty 0.4

## 2021-10-18 NOTE — H&P (Addendum)
Date: 10/19/2021               Patient Name:  Jonathan Rangel MRN: [id number]  DOB: June 13, 1976 Age / Sex: 45 y.o., male   PCP: Madalyn Rob, MD         Medical Service: Internal Medicine Teaching Service         Attending Physician: Dr. Evette Doffing, Mallie Mussel, *    First Contact: Dr. Ileene Musa Pager: [phone]  Second Contact: Dr. Lisabeth Devoid Pager: [phone]       After Hours (After 5p/  First Contact Pager: [phone]  weekends / holidays): Second Contact Pager: [phone]   Chief Complaint: Abdominal pain  History of Present Illness: Jonathan Rangel is a 45 year old male with a PMHx of HIV, anal fistula status post I&D, COPD, rheumatoid arthritis, anxiety presented to the ED as a direct admit from [facility] complaining of abdominal pain.  Patient states he has been experiencing ongoing abdominal pain since August 2022.  And states acute worsening of this pain in the last couple weeks.  Patient endorsed when presenting for his clinic visit yesterday he had an 1 episode of vomiting.  Patient denies fever or chills.  Patient states that the abdominal pain is localized to the left upper and lower quadrant with radiation to the left groin and left lower back.  Patient states that the pain is constant.  Patient denies any recent falls or trauma to the abdomen.  Patient denies headache, lightheadedness, changes in vision, chest pain, shortness of breath, hematemesis, or hematochezia.  However, patient endorses intermittent diarrhea and constipation.  No pain or straining with defecation.  Patient does endorse intermittent mucus in his stool, which she states is coming from the anal fistula.  He denies pus drainage or pain from anal fistula.  Meds:  Albuterol  Biktarvy  Fluoxetine 20 mg  Gabapentin 400 mg TID  Hydroxyzine 25 mg TID  Montelukast 10 mg  Zofran PRN Oxycodone 5 mg q6h prn Prednisone 2mg  daily Trazodone 100 mg    Allergies: Allergies as of 10/17/2021 - Review Complete 10/17/2021  Allergen  Reaction Noted   Keflex [cephalexin] Palpitations 03/21/2018   Past Medical History:  Diagnosis Date   Anal fistula    Asthma with COPD (chronic obstructive pulmonary disease) ([place])    followed by pcp   Chronic headache disorder    residual from benign neoplasm cerebral meninges post gamma knife 2016   Chronic pain    followed by pain clinic   Eczema    Fibromyalgia    History of Bell's palsy    per pt 1997 and 2015 both time right side , in 2015 found to have meninges tumor   History of benign neoplasm of cerebral meninges    s/p  gamma knife 05-28-2015  right temporal lobe   HIV (human immunodeficiency virus infection) ([place]) 2012   followed by Janene Madeira NP @ [facility]   RA (rheumatoid arthritis) ([place])    per pt was dx approx 2008,  followed by pain clinic , does not have a rheumatologist    Family History: Mother-COPD, fibromyalgia, rheumatoid arthritis; maternal grandmother-breast cancer  Social History: Patient lives at home with mother.  Patient is HIV positive.  Denies alcohol use.  Patient is a chronic smoker of cigarettes, he smokes 2 to 3 cigarettes/day.  Patient endorses marijuana use only and denies any other illicit drug use.  Review of Systems: A complete ROS was negative except as per HPI.   Physical Exam:  Blood pressure 117/78, pulse (!) 59, temperature 98.1 F (36.7 C), temperature source Oral, resp. rate 17, height 5\' 8"  (1.727 m), weight 71.1 kg, SpO2 100 %. Physical Exam Constitutional:      General: He is awake. He is not in acute distress. HENT:     Head: Normocephalic and atraumatic.  Cardiovascular:     Rate and Rhythm: Normal rate.     Heart sounds: Normal heart sounds.  Pulmonary:     Effort: Pulmonary effort is normal.     Breath sounds: Normal breath sounds and air entry.  Abdominal:     General: Abdomen is protuberant. Bowel sounds are normal.     Palpations: Abdomen is soft. There is no hepatomegaly.     Tenderness: There is generalized  abdominal tenderness. There is left CVA tenderness. There is no right CVA tenderness, guarding or rebound.     Hernia: There is no hernia in the left inguinal area or right inguinal area.  Musculoskeletal:     Right lower leg: No edema.     Left lower leg: No edema.  Skin:    General: Skin is warm and dry.  Neurological:     General: No focal deficit present.     Mental Status: He is alert.  Psychiatric:        Behavior: Behavior normal. Behavior is cooperative.     DG Abd 1 View  Result Date: 10/17/2021 CLINICAL DATA:  Left lower quadrant abdominal pain. EXAM: ABDOMEN - 1 VIEW COMPARISON:  CT abdomen pelvis dated January 20, 2020. FINDINGS: The bowel gas pattern is normal. No radio-opaque calculi or other significant radiographic abnormality are seen. Prior cholecystectomy. IMPRESSION: Negative. Electronically Signed   By: Titus Dubin M.D.   On: 10/17/2021 15:48     Assessment & Plan by Problem: Active Problems:   Abdominal pain   Gastritis   #Abdominal Pain #Possible gastritis Patient endorses 10 out of 10 stabbing abdominal pain of the left upper quadrant and left lower quadrant of the abdomen.  He states this pain radiates to the left groin and left CVA.  He states this pain has been ongoing for several months.  Within the past week patient states intermittent diarrhea and constipation.  He endorses 1 episode of vomiting 1 days ago.  He denies hematemesis, hematochezia or melena.  He denies fevers, chills, or nausea. Patient denied any recent falls or trauma to the abdomen. Patient is able to tolerate appetite.  Recent colonoscopy March 2021 was unremarkable. Patient had a recent I&D for anal fistula in August 2022.  No obstipation noted, with last bowel movement this morning. On physical exam of the abdomen, pain was not out of proportion to the exam.  Patient endorsed some tenderness with palpation throughout all 4 quadrants with pain worse in the LLQ.  Bowel sounds normal on  auscultation. KUB obtained yesterday revealed normal gas pattern with possible stool burden. Labs obtained during admission: CMP was unremarkable. Lipase was within normal limits. CBC with differentials was also unremarkable. CVA tenderness of the left side was also noted on physical exam. Patient denies hematuria. UA was obtained to rule out possible infection. Patient endorsed left-sided groin pain associated with his abdominal pain. No bulging structure noted of the groin; no bulging with increased intraabdominal pressure. All initial laboratory testing have been benign and KUB reassuring.  Since admission, patient denies any further episodes of vomiting and patient endorses appetite as well as regular bowel movements.  No dysuria or decreased urine  output noted. It appears, ileus may have resolved. --Advance diet as tolerated --If abdominal pain persists will consider CT of the abdomen for further evaluation --UA pending --Zofran 4mg  Q6H PRN --Encourage fluid intake --Monitor for worsening signs of abdominal pain or worsening symptoms  Anal fistula Patient initially diagnosed with anal fistula in 2004.  Denies any history of Crohn's disease.  Recently underwent I&D for annual fistula August 2022.  Patient states that intermittently he would notice mucus in his stool.  However he denies rectal pain or rectal pain with defecation.  He denies hematochezia or melena.  Colonoscopy in 2021 was unremarkable.  HIV Controlled currently on Biktarvy.  Follows infectious disease. --Resume home medication Biktarvy  COPD Asthma   Patient denies shortness of breath at this time.  He does however endorse a intermittent chronic cough with sputum production.  Which is his baseline. --Resume home medication albuterol as needed --Resume home medication montelukast  Rheumatoid Arthritis Diagnosed in 2008. Follows a Rheumatologist intermittently. He was prescribed prednisone 5mg  by  rheumatolosit Dr.Govinda  Aryal started in October for 1 month duration. Patient can benefit from outpatient follow up. Patient denies any joint pain at this time. --Tylenol 650 Q6H PRN for pain  Fibromyalgia --Resumed home medication fluoxetine 60 mg daily  Anxiety --Resume home medication hydroxyzine 25 mg 3 times daily as needed  Insomnia --Resume home medication trazodone 100 mg at bedtime  Dispo: Admit patient to Observation with expected length of stay less than 2 midnights.  Signed: Timothy Lasso, MD 10/19/2021, 7:30 AM  Pager: [phone] After 5pm on weekdays and 1pm on weekends: On Call pager: [phone]

## 2021-10-18 NOTE — Progress Notes (Signed)
New Admission Note:   Arrival Method: walked  Mental Orientation: Alert and oriented  Telemetry: none  Assessment: Completed Skin: Intact  IV: none  Pain: 10/10  Tubes: none  Safety Measures: Safety Fall Prevention Plan has been given, discussed and signed Admission: Completed [facility] [place] Orientation: Patient has been orientated to the room, unit and staff.  Family: none   Orders have been reviewed and implemented. Will continue to monitor the patient. Call light has been placed within reach and bed alarm has been activated.   Goerge Mohr RN [facility] Phone: [phone]

## 2021-10-18 NOTE — Progress Notes (Signed)
MD is aware that patient is on the unit. Awaiting orders.

## 2021-10-18 NOTE — Plan of Care (Signed)
  Problem: Education: Goal: Knowledge of General Education information will improve Description Including pain rating scale, medication(s)/side effects and non-pharmacologic comfort measures Outcome: Progressing   

## 2021-10-18 NOTE — Assessment & Plan Note (Signed)
  He reports he starting having increased abdominal pain after I&D in August for recurrent anal fistula. The pain is in the left upper quadrant and down to left lower quadrant. The past week the pain has gotten worse. His last formed bowel movement was 1 week ago. He has had small and watery bowel movement. He has lost his appetite this week and vomited once.  He has been able to hold down liquids. He is not experiencing any nausea.   Assessment/Plan: Chronic problem with acute worsening. No inflammatory changes on colonoscopy 01/2020. He is on cholestyramine for IBS .  -Concern for SBO, sent patient for abdominal film. Abdominal film shows nl gas pattern. Patient likely has ileus. No systemic signs of infection. Given patient has not been able to hold down food we have requested admission for observation, but hospital is full.  Patient would like to go home and wait for hospital bed to open up at [facility]. Bed request was made. He was given advice to return to ED if his condition worsens. I communicated with our inpatient team to expect patient to be admitted overnight or tomorrow if a bed becomes available. Patient will stay on clear liquids at home and not eat solid food.

## 2021-10-19 ENCOUNTER — Observation Stay (HOSPITAL_COMMUNITY): Payer: Medicare HMO

## 2021-10-19 ENCOUNTER — Other Ambulatory Visit: Payer: Self-pay

## 2021-10-19 ENCOUNTER — Encounter (HOSPITAL_COMMUNITY): Payer: Self-pay | Admitting: Student in an Organized Health Care Education/Training Program

## 2021-10-19 DIAGNOSIS — J45909 Unspecified asthma, uncomplicated: Secondary | ICD-10-CM | POA: Diagnosis not present

## 2021-10-19 DIAGNOSIS — K2970 Gastritis, unspecified, without bleeding: Secondary | ICD-10-CM | POA: Diagnosis present

## 2021-10-19 DIAGNOSIS — Z79899 Other long term (current) drug therapy: Secondary | ICD-10-CM | POA: Diagnosis not present

## 2021-10-19 DIAGNOSIS — K529 Noninfective gastroenteritis and colitis, unspecified: Secondary | ICD-10-CM | POA: Diagnosis not present

## 2021-10-19 DIAGNOSIS — R112 Nausea with vomiting, unspecified: Secondary | ICD-10-CM | POA: Diagnosis present

## 2021-10-19 DIAGNOSIS — B20 Human immunodeficiency virus [HIV] disease: Secondary | ICD-10-CM | POA: Diagnosis not present

## 2021-10-19 DIAGNOSIS — J449 Chronic obstructive pulmonary disease, unspecified: Secondary | ICD-10-CM | POA: Diagnosis not present

## 2021-10-19 DIAGNOSIS — R1032 Left lower quadrant pain: Secondary | ICD-10-CM | POA: Diagnosis not present

## 2021-10-19 DIAGNOSIS — F17210 Nicotine dependence, cigarettes, uncomplicated: Secondary | ICD-10-CM | POA: Diagnosis not present

## 2021-10-19 DIAGNOSIS — Z20822 Contact with and (suspected) exposure to covid-19: Secondary | ICD-10-CM | POA: Diagnosis not present

## 2021-10-19 LAB — URINALYSIS, ROUTINE W REFLEX MICROSCOPIC
Bilirubin Urine: NEGATIVE
Glucose, UA: NEGATIVE mg/dL
Hgb urine dipstick: NEGATIVE
Ketones, ur: NEGATIVE mg/dL
Leukocytes,Ua: NEGATIVE
Nitrite: NEGATIVE
Protein, ur: NEGATIVE mg/dL
Specific Gravity, Urine: 1.02 (ref 1.005–1.030)
pH: 6 (ref 5.0–8.0)

## 2021-10-19 MED ORDER — LACTATED RINGERS IV SOLN: INTRAVENOUS | Status: DC

## 2021-10-19 MED ORDER — IOHEXOL 9 MG/ML PO SOLN
ORAL | Status: AC
  Filled 2021-10-19: qty 1000

## 2021-10-19 MED ORDER — IOHEXOL 350 MG/ML SOLN
80.0000 mL | Freq: Once | INTRAVENOUS | Status: AC | PRN
  Administered 2021-10-19: 80 mL via INTRAVENOUS

## 2021-10-19 NOTE — Progress Notes (Addendum)
HD#0 Subjective:  Overnight Events: NAEO  Patient says that he has not had any vomiting overnight but feels like he might vomit right now. He has not had an appetite this morning. The pain is still bothering him in his side as well as in his genital area.  Objective:  Vital signs in last 24 hours: Vitals:   10/18/21 1837 10/18/21 2113 10/19/21 0508 10/19/21 0750  BP:  126/81 117/78 118/77  Pulse:  (!) 59 (!) 59 (!) 58  Resp:  18 17 16   Temp:  98.4 F (36.9 C) 98.1 F (36.7 C) 98.7 F (37.1 C)  TempSrc:  Oral Oral Oral  SpO2:  100% 100% 100%  Weight: 71.1 kg     Height: 5\' 8"  (1.727 m)      Supplemental O2: Room Air SpO2: 100 %   Physical Exam:  Constitutional: middle aged man uncomfortably in bed, in no acute distress Cardiovascular: regular rate and rhythm, no m/r/g Pulmonary/Chest: normal work of breathing on room air Abdominal: soft, normal bowel sounds, mild tenderness diffusely GU: no lesions, rashes, or swelling noted, mild tenderness of the scrotum on palpation Neurological: alert and answering questions appropriately Skin: warm and dry  Filed Weights   10/18/21 1837  Weight: 71.1 kg     Intake/Output Summary (Last 24 hours) at 10/19/2021 0955 Last data filed at 10/19/2021 0900 Gross per 24 hour  Intake 240 ml  Output 0 ml  Net 240 ml   Net IO Since Admission: 240 mL [10/19/21 0955]  Pertinent Labs: CBC Latest Ref Rng & Units 10/18/2021 09/02/2020 09/29/2019  WBC 4.0 - 10.5 K/uL 9.3 12.1(H) 11.7(H)  Hemoglobin 13.0 - 17.0 g/dL 13.9 13.2 14.6  Hematocrit 39.0 - 52.0 % 41.0 40.4 44.2  Platelets 150 - 400 K/uL 270 258 341    CMP Latest Ref Rng & Units 10/18/2021 09/02/2020 09/29/2019  Glucose 70 - 99 mg/dL 87 94 100(H)  BUN 6 - 20 mg/dL 12 13 14   Creatinine 0.61 - 1.24 mg/dL 1.19 1.09 1.37(H)  Sodium 135 - 145 mmol/L 135 140 139  Potassium 3.5 - 5.1 mmol/L 3.7 3.9 3.8  Chloride 98 - 111 mmol/L 104 106 102  CO2 22 - 32 mmol/L 26 27 27   Calcium  8.9 - 10.3 mg/dL 8.9 8.8(L) 9.8  Total Protein 6.5 - 8.1 g/dL 6.3(L) 7.1 7.4  Total Bilirubin 0.3 - 1.2 mg/dL 0.3 0.4 0.5  Alkaline Phos 38 - 126 U/L 67 60 -  AST 15 - 41 U/L 15 15 18   ALT 0 - 44 U/L 15 13 9     Imaging: No results found.  Assessment/Plan:   Active Problems:   Abdominal pain   Gastritis   Patient Summary: Jonathan Rangel is a 45 y.o. with a pertinent PMH of HIV, anal fistula status post I&D, COPD, rheumatoid arthritis, who was admitted for abdominal pain.  #Abdominal Pain Patient had some improvement upon initial interview last night but seems to be feeling worse this morning. Was hungry and ate last night but is not really feeling up to it this morning. The pain is present in his left lower flank and he also endorses pain in his testicles/perineum area. Given slight regression in overall status, high risk status for complications given comorbidities, and lack of clear source of pain will proceed with further imaging. Will also check for STI source given groin pain. -- f/u CT -- NPO pending imaging -- UA unremarkable; f/u G/C chlamydia  -- Zofran 4mg  Q6H PRN --  IV fluids 100/hr 12 hours   Anal fistula Recently underwent I&D for annual fistula August 2022.  Patient states that intermittently he would notice mucus in his stool. However he denies rectal pain or rectal pain with defecation. He denies hematochezia or melena.  -f/u CT   HIV --continue home medication Biktarvy --check CD4 count and RPR titer   COPD Asthma   --continue home albuterol and montelukast   Rheumatoid Arthritis --outpatient follow up --Tylenol 650 Q6H PRN for pain   Fibromyalgia --continue home fluoxetine 60 mg daily   Anxiety -- continue home hydroxyzine 25 mg 3 times daily as needed   Insomnia --Resume home medication trazodone 100 mg at bedtime  Scarlett Presto, MD Internal Medicine Resident PGY-1 Pager [phone] Please contact the on call pager after 5 pm and on weekends at  [phone].

## 2021-10-19 NOTE — TOC Initial Note (Signed)
Transition of Care [facility]) - Initial/Assessment Note    Patient Details  Name: Jonathan Rangel MRN: [id number] Date of Birth: [birth year]  Transition of Care [facility]) CM/SW Contact:    Tom-Johnson, Renea Ee, RN Phone Number: 10/19/2021, 2:46 PM  Clinical Narrative:                 CM spoke with patient at bedside about needs for post hospital transition. Presented with and being treated for abdominal pain. Patient states he lives at home with his mother. Does not have children. Currently on disability. Does not have DME's and uses public transportation. PCP is Madalyn Rob, MD [facility] [address]. Has [employer] and Medicaid [place] Access. Medical workup continues. [place] letter done and read to patient with understanding verbalized. No recommendations noted at this time. CM will continue to follow with needs.     Barriers to Discharge: Continued Medical Work up   Patient Goals and CMS Choice Patient states their goals for this hospitalization and ongoing recovery are:: To go home CMS Medicare.gov Compare Post Acute Care list provided to:: Patient    Expected Discharge Plan and Services     Discharge Planning Services: CM Consult   Living arrangements for the past 2 months: Apartment                                      Prior Living Arrangements/Services Living arrangements for the past 2 months: Apartment Lives with:: Parents (Mother) Patient language and need for interpreter reviewed:: Yes Do you feel safe going back to the place where you live?: Yes      Need for Family Participation in Patient Care: Yes (Comment) Care giver support system in place?: Yes (comment)   Criminal Activity/Legal Involvement Pertinent to Current Situation/Hospitalization: No - Comment as needed  Activities of Daily Living Home Assistive Devices/Equipment: None ADL Screening (condition at time of admission) Patient's cognitive ability adequate to safely complete  daily activities?: Yes Is the patient deaf or have difficulty hearing?: No Does the patient have difficulty seeing, even when wearing glasses/contacts?: Yes Does the patient have difficulty concentrating, remembering, or making decisions?: Yes Patient able to express need for assistance with ADLs?: Yes Does the patient have difficulty dressing or bathing?: No Independently performs ADLs?: Yes (appropriate for developmental age) Does the patient have difficulty walking or climbing stairs?: Yes Weakness of Legs: Both Weakness of Arms/Hands: None  Permission Sought/Granted Permission sought to share information with : Family Supports Permission granted to share information with : Yes, Verbal Permission Granted              Emotional Assessment Appearance:: Appears stated age Attitude/Demeanor/Rapport: Engaged Affect (typically observed): Accepting, Appropriate, Calm, Hopeful Orientation: : Oriented to Self, Oriented to Place, Oriented to  Time, Oriented to Situation Alcohol / Substance Use: Not Applicable Psych Involvement: No (comment)  Admission diagnosis:  [facility]) [K56.7] Patient Active Problem List   Diagnosis Date Noted   Nausea & vomiting 10/19/2021   Elevated blood pressure reading 03/16/2021   Blurry vision, bilateral 02/15/2021   Neck pain 12/14/2020   Paresthesia 12/14/2020   Dyshidrotic eczema 09/29/2020   Pruritic rash 09/29/2020   Seasonal allergies 03/31/2020   Esophageal dysphagia 03/31/2020   Abdominal pain 10/16/2019   Anxiety 01/23/2019   Anal fistula 10/22/2018   Healthcare maintenance 07/30/2018   Amphetamine and psychostimulant-induced psychosis with delusions ([place]) 05/21/2018  Substance induced mood disorder ([place]) 01/24/2018   Abnormal liver function 11/23/2017   Chronic pain    HIV (human immunodeficiency virus infection) ([place]) 11/20/2017   Rheumatoid arthritis ([place]) 11/20/2017   Fibromyalgia 11/20/2017   History of syphilis 01/14/2015    PCP:  Madalyn Rob, MD Pharmacy:   [id number] [place], [place] [address] Phone: [phone] Fax: [phone]     Social Determinants of Health (SDOH) Interventions    Readmission Risk Interventions No flowsheet data found.

## 2021-10-19 NOTE — Discharge Summary (Signed)
Name: Jonathan Rangel MRN: [id number] DOB: 24-Jan-1976 45 y.o. PCP: Madalyn Rob, MD  Date of Admission: 10/18/2021  5:44 PM Date of Discharge:   10/19/21 Attending Physician: Axel Filler, *  Discharge Diagnosis: 1. Abdominal Pain 2/2 gastroenteritis 2. Anal fistula 3. HIV 4. COPD; Asthma 5. Rheumatoid arthritis 6. Fibromyalgia 7. Anxiety 8. Insomnia  Discharge Medications: Allergies as of 10/19/2021       Reactions   Keflex [cephalexin] Palpitations        Medication List     TAKE these medications    albuterol (2.5 MG/3ML) 0.083% nebulizer solution Commonly known as: PROVENTIL Take 2.5 mg by nebulization every 6 (six) hours as needed for wheezing or shortness of breath. What changed: Another medication with the same name was changed. Make sure you understand how and when to take each.   Ventolin HFA 108 (90 Base) MCG/ACT inhaler Generic drug: albuterol USE 1-2 PUFFS EVERY 4-6 HOURS AS NEEDED FOR SHORTNESS OF BREATH What changed: See the new instructions.   betamethasone dipropionate 0.05 % cream APPLY TOPICALLY TWICE A DAY What changed:  how much to take how to take this when to take this reasons to take this   Biktarvy 50-200-25 MG Tabs tablet Generic drug: bictegravir-emtricitabine-tenofovir AF TAKE 1 TABLET BY MOUTH DAILY. TRY TO TAKE AT THE SAME TIME EACH DAY WITH OR WITHOUT FOOD What changed: See the new instructions.   cholestyramine 4 g packet Commonly known as: QUESTRAN TAKE 1 PACKET AS DIRECTED BY MOUTH DAILY What changed: See the new instructions.   FLUoxetine 20 MG capsule Commonly known as: PROZAC TAKE 3 CAPSULES BY MOUTH EVERY DAY   fluticasone 50 MCG/ACT nasal spray Commonly known as: FLONASE SPRAY 1 SPRAY INTO BOTH NOSTRILS DAILY What changed: See the new instructions.   gabapentin 400 MG capsule Commonly known as: NEURONTIN TAKE 1 CAPSULE BY MOUTH THREE TIMES A DAY What changed:  how much to take how to take this when  to take this additional instructions   hydroxychloroquine 200 MG tablet Commonly known as: PLAQUENIL Take 200 mg by mouth daily.   montelukast 10 MG tablet Commonly known as: SINGULAIR TAKE 1 TABLET BY MOUTH EVERYDAY AT BEDTIME What changed: See the new instructions.   ondansetron 4 MG tablet Commonly known as: Zofran Take 1 tablet (4 mg total) by mouth every 8 (eight) hours as needed for nausea or vomiting.   predniSONE 5 MG tablet Commonly known as: DELTASONE Take 5 mg by mouth daily.   traZODone 100 MG tablet Commonly known as: DESYREL TAKE 1 TABLET BY MOUTH EVERYDAY AT BEDTIME What changed: See the new instructions.        Disposition and follow-up:   Mr.Paco Cazarez was discharged from [facility] in Stable condition.  At the hospital follow up visit please address:  1.  Abdominal pain- make sure pain is resolved and patient is able to tolerate normal diet  2. HIV- patient missed last ID appointment, make sure he has follow up scheduled  3. Rheumatoid arthritis-  make sure he has outpatient follow up scheduled  4.  Labs / imaging needed at time of follow-up: bmp  5.  Pending labs/ test needing follow-up: GC chlamydia, CD4, RPR  Follow-up Appointments:   Hospital Course by problem list: 1. Abdominal Pain 2/2 gastroenteritis Patient presented from clinic for abdominal pain, nausea, and vomiting. By the time he was seen for admission pain had improved and patient was able to tolerate a diet. However the  next morning the pain was a little bit worse and he was nauseous again. Given his comorbidities and overall regression patient was given IV fluids and a CT was obtained at that time which showed no acute abnormalities. Patient was given normal diet after reassuring CT. By that afternoon patient was eating and drinking a normal amount and his pain had totally resolved. He wanted to go home. Given his overall reassuring labs and imaging he was deemed  stable for discharge and told to follow up in a week with his PCP. Suspect that self limiting viral illness was underlying cause of pain.  2. Anal fistula Patient has longstanding history of anal fistula, underwent I/D in August 2022. Required no intervention while admitted  3. HIV Continued home Biktarvy, CD4 count ordered. Will need further outpatient follow up  4. COPD Asthma Patient was at baseline and not in flare, ordered home albuterol and montelukast.  5.Rheumatoid arthritis On prednisone 5 mg from outpatient doctor started in October. Would benefit from further follow up as an outpatient. No further interventions required while inpatient.   6. Fibromyalgia Continued home medication fluoxetine 60 mg daily   7. Anxiety Continued home medication hydroxyzine 25 mg 3 times daily as needed   8. Insomnia Continued home medication trazodone 100 mg at bedtime  Discharge Exam:   BP 118/77 (BP Location: Left Arm)   Pulse (!) 58   Temp 98.7 F (37.1 C) (Oral)   Resp 16   Ht 5\' 8"  (1.727 m)   Wt 71.1 kg   SpO2 100%   BMI 23.83 kg/m  Constitutional: middle aged man uncomfortably in bed, in no acute distress Cardiovascular: regular rate and rhythm, no m/r/g Pulmonary/Chest: normal work of breathing on room air Abdominal: soft, normal bowel sounds, mild tenderness diffusely GU: no lesions, rashes, or swelling noted, mild tenderness of the scrotum on palpation Neurological: alert and answering questions appropriately Skin: warm and dry    Pertinent Labs, Studies, and Procedures:   CT ABDOMEN PELVIS W CONTRAST  Result Date: 10/19/2021 CLINICAL DATA:  Left lower quadrant pain. EXAM: CT ABDOMEN AND PELVIS WITH CONTRAST TECHNIQUE: Multidetector CT imaging of the abdomen and pelvis was performed using the standard protocol following bolus administration of intravenous contrast. CONTRAST:  97mL OMNIPAQUE IOHEXOL 350 MG/ML SOLN COMPARISON:  01/20/2020. FINDINGS: Lower chest: Heart  size normal. No pericardial or pleural effusion. Distal esophagus is unremarkable. Hepatobiliary: Liver is unremarkable. Cholecystectomy. No biliary ductal dilatation. Pancreas: Negative. Spleen: Negative. Adrenals/Urinary Tract: Adrenal glands and kidneys are unremarkable. Ureters are decompressed. Bladder is grossly unremarkable. Stomach/Bowel: Stomach, small bowel, appendix and colon are unremarkable. There may be rectal prolapse. Vascular/Lymphatic: Vascular structures are unremarkable. No pathologically enlarged lymph nodes. Reproductive: Prostate is normal in size. Other: No free fluid.  Mesenteries and peritoneum are unremarkable. Musculoskeletal: No worrisome lytic or sclerotic lesions. IMPRESSION: 1. No acute findings. 2. Possible rectal prolapse. Electronically Signed   By: Lorin Picket M.D.   On: 10/19/2021 13:25    CBC Latest Ref Rng & Units 10/18/2021 09/02/2020 09/29/2019  WBC 4.0 - 10.5 K/uL 9.3 12.1(H) 11.7(H)  Hemoglobin 13.0 - 17.0 g/dL 13.9 13.2 14.6  Hematocrit 39.0 - 52.0 % 41.0 40.4 44.2  Platelets 150 - 400 K/uL 270 258 341    BMP Latest Ref Rng & Units 10/18/2021 09/02/2020 09/29/2019  Glucose 70 - 99 mg/dL 87 94 100(H)  BUN 6 - 20 mg/dL 12 13 14   Creatinine 0.61 - 1.24 mg/dL 1.19 1.09 1.37(H)  BUN/Creat Ratio  6 - 22 (calc) - - 10  Sodium 135 - 145 mmol/L 135 140 139  Potassium 3.5 - 5.1 mmol/L 3.7 3.9 3.8  Chloride 98 - 111 mmol/L 104 106 102  CO2 22 - 32 mmol/L 26 27 27   Calcium 8.9 - 10.3 mg/dL 8.9 8.8(L) 9.8    Discharge Instructions: Discharge Instructions     Call MD for:  persistant dizziness or light-headedness   Complete by: As directed    Call MD for:  persistant nausea and vomiting   Complete by: As directed    Call MD for:  severe uncontrolled pain   Complete by: As directed    Call MD for:  temperature >100.4   Complete by: As directed    Diet - low sodium heart healthy   Complete by: As directed    Increase activity slowly   Complete by: As  directed        Signed: Scarlett Presto, MD 10/19/2021, 4:59 PM   Pager: [phone]

## 2021-10-19 NOTE — Care Management Obs Status (Signed)
[facility] NOTIFICATION   Patient Details  Name: Jonathan Rangel MRN: [id number] Date of Birth: 1976/07/05   Medicare Observation Status Notification Given:  Yes    Tom-Johnson, Renea Ee, RN 10/19/2021, 1:44 PM

## 2021-10-19 NOTE — Discharge Instructions (Signed)
Jonathan Rangel were recently admitted to [facility] for abdominal pain. We ran some tests and gave you some IV fluids until your pain improved.  Everything came back normal.   You should seek further medical care if your pain worsens, you have difficulty eating or drinking, you develop fevers, or have new nausea or vomiting.  We recommend that you see your primary care doctor in about a week to make sure that you continue to improve. We are so glad that you are feeling better.  Sincerely, Scarlett Presto, MD

## 2021-10-20 ENCOUNTER — Other Ambulatory Visit: Payer: Self-pay | Admitting: Physical Medicine and Rehabilitation

## 2021-10-20 ENCOUNTER — Other Ambulatory Visit: Payer: Self-pay | Admitting: Infectious Diseases

## 2021-10-20 ENCOUNTER — Other Ambulatory Visit: Payer: Self-pay | Admitting: Internal Medicine

## 2021-10-20 DIAGNOSIS — L301 Dyshidrosis [pompholyx]: Secondary | ICD-10-CM

## 2021-10-20 DIAGNOSIS — Z21 Asymptomatic human immunodeficiency virus [HIV] infection status: Secondary | ICD-10-CM

## 2021-10-20 LAB — RPR
RPR Ser Ql: REACTIVE — AB
RPR Titer: 1:2 {titer}

## 2021-10-20 LAB — T-HELPER CELLS (CD4) COUNT (NOT AT ARMC)
CD4 % Helper T Cell: 33 % (ref 33–65)
CD4 T Cell Abs: 843 /uL (ref 400–1790)

## 2021-10-20 NOTE — Telephone Encounter (Signed)
Left VM asking patient to return my call.

## 2021-10-21 LAB — T.PALLIDUM AB, TOTAL: T Pallidum Abs: REACTIVE — AB

## 2021-10-24 ENCOUNTER — Other Ambulatory Visit: Payer: Self-pay

## 2021-10-24 ENCOUNTER — Other Ambulatory Visit: Payer: Medicare HMO

## 2021-10-24 ENCOUNTER — Other Ambulatory Visit: Payer: Self-pay | Admitting: *Deleted

## 2021-10-24 DIAGNOSIS — Z21 Asymptomatic human immunodeficiency virus [HIV] infection status: Secondary | ICD-10-CM

## 2021-10-24 DIAGNOSIS — Z79899 Other long term (current) drug therapy: Secondary | ICD-10-CM | POA: Diagnosis not present

## 2021-10-24 DIAGNOSIS — B20 Human immunodeficiency virus [HIV] disease: Secondary | ICD-10-CM | POA: Diagnosis not present

## 2021-10-24 MED ORDER — GABAPENTIN 400 MG PO CAPS
ORAL_CAPSULE | ORAL | 1 refills | Status: DC
Start: 2021-10-24 — End: 2022-02-27

## 2021-10-24 NOTE — Progress Notes (Deleted)
Subjective:    Patient ID: Jonathan Rangel, male    DOB: 08-25-1976, 45 y.o.   MRN: [id number]  HPI  Jonathan Rangel is a 45 year old man who presents to establish care for cervical spine pain. He has a history of fibromyalgia, bilateral carpal tunnel syndrome, and rheumatoid arthritis.   Average pain is 10/10.   He has very tight cervical muscles- this is currently where his pain was worst. He used to get trigger point injections with excellent relief. He thinks he may have been getting it with steroids. He used to get this done in [place]. He used to take Hydrocodone 65m, muscle relaxer, 371mof morphine (which he did not like). He does not like medications because he likes to stay focused. The hydrocodone used to work very well for him. He is currently on Gabapentin and this makes him shaky. He was taking it twice per day. He tried Lyrica and this caused swelling.   Pain Inventory Average Pain 10 Pain Right Now 10 My pain is sharp, burning, dull, stabbing, tingling and aching  In the last 24 hours, has pain interfered with the following? General activity 7 Relation with others 5 Enjoyment of life 10 What TIME of day is your pain at its worst? morning  and daytime Sleep (in general) Fair  Pain is worse with: walking, bending, sitting, standing and some activites Pain improves with: rest, heat/ice, medication, TENS and injections Relief from Meds: 5  walk without assistance how many minutes can you walk? 20-30 ability to climb steps?  no do you drive?  no  not employed: date last employed 12/2002  bladder control problems bowel control problems weakness numbness tingling trouble walking spasms anxiety  New pt  New pt    Family History  Problem Relation Age of Onset  . Breast cancer Maternal Grandmother   . Cancer Maternal Aunt   . COPD Mother   . Fibromyalgia Mother   . Rheum arthritis Mother   . Other Father        unsure of medical history   Social History    Socioeconomic History  . Marital status: Single    Spouse name: Not on file  . Number of children: 0  . Years of education: 1261. Highest education level: High school graduate  Occupational History  . Occupation: Disabled  Tobacco Use  . Smoking status: Every Day    Years: 32.00    Types: Cigarettes  . Smokeless tobacco: Never  . Tobacco comments:    06-20-2021  per pt 2-3 cig per day/  1pp3days  Vaping Use  . Vaping Use: Never used  Substance and Sexual Activity  . Alcohol use: Not Currently  . Drug use: Not Currently    Types: Marijuana    Comment: [phone]. Sexual activity: Not Currently    Comment: declined condoms 02/2021  Other Topics Concern  . Not on file  Social History Narrative   Lives at home with mother.   Right-handed.   No daily caffeine use.   Social Determinants of Health   Financial Resource Strain: Not on file  Food Insecurity: Not on file  Transportation Needs: Not on file  Physical Activity: Not on file  Stress: Not on file  Social Connections: Not on file   Past Surgical History:  Procedure Laterality Date  . ANAL FISTULOTOMY  2004  . CARPAL TUNNEL WITH CUBITAL TUNNEL Right 2014  . CHOLECYSTECTOMY, LAPAROSCOPIC  2009  . COLONOSCOPY  2015   patient reported normal   . PLACEMENT OF SETON N/A 06/22/2021   Procedure: incision and drainage of abcess;  Surgeon: Leighton Ruff, MD;  Location: [facility];  Service: General;  Laterality: N/A;  . RECTAL EXAM UNDER ANESTHESIA N/A 06/22/2021   Procedure: RECTAL EXAM UNDER ANESTHESIA;  Surgeon: Leighton Ruff, MD;  Location: [facility];  Service: General;  Laterality: N/A;  . Stereotactic destruction of lesion using gamma radiation  05/28/2015   right temporal lobe ;  in [place], [place]   Past Medical History:  Diagnosis Date  . Anal fistula   . Asthma with COPD (chronic obstructive pulmonary disease) ([place])    followed by pcp  . Chronic  headache disorder    residual from benign neoplasm cerebral meninges post gamma knife 2016  . Chronic pain    followed by pain clinic  . Eczema   . Fibromyalgia   . History of Bell's palsy    per pt 1997 and 2015 both time right side , in 2015 found to have meninges tumor  . History of benign neoplasm of cerebral meninges    s/p  gamma knife 05-28-2015  right temporal lobe  . HIV (human immunodeficiency virus infection) ([place]) 2012   followed by Jonathan Madeira NP @ ID  . RA (rheumatoid arthritis) ([place])    per pt was dx approx 2008,  followed by pain clinic , does not have a rheumatologist   There were no vitals taken for this visit.  Opioid Risk Score:   Fall Risk Score:  `1  Depression screen PHQ 2/9  Depression screen [facility] 2/9 10/17/2021 03/16/2021 03/10/2021 02/15/2021 12/16/2020 09/27/2020 09/01/2020  Decreased Interest 0 0 0 0 2 0 1  Down, Depressed, Hopeless 0 0 0 0 0 0 2  PHQ - 2 Score 0 0 0 0 2 0 3  Altered sleeping 0 - - - 3 - 3  Tired, decreased energy 0 - - - 3 - 3  Change in appetite 0 - - - 3 - 3  Feeling bad or failure about yourself  0 - - - 0 - 0  Trouble concentrating 0 - - - 3 - 2  Moving slowly or fidgety/restless 0 - - - 2 - 3  Suicidal thoughts 0 - - - 0 - 0  PHQ-9 Score 0 - - - 16 - 17  Difficult doing work/chores Not difficult at all - - - Somewhat difficult - Very difficult  Some recent data might be hidden   Review of Systems  Constitutional:  Positive for appetite change, diaphoresis and unexpected weight change.  Respiratory:  Positive for cough and shortness of breath.   Gastrointestinal:  Positive for abdominal pain, constipation and nausea.  Skin:  Positive for rash.       spasms  Neurological:  Positive for weakness and numbness.       Tingling  Psychiatric/Behavioral:  The patient is nervous/anxious.        Objective:   Physical Exam Gen: no distress, normal appearing HEENT: oral mucosa pink and moist, NCAT Cardio: Reg rate Chest: normal  effort, normal rate of breathing Abd: soft, non-distended Ext: no edema Skin: intact Neuro: Alert and oriented x3.  Musculoskeletal: Tight cervical mucles bilaterally.  Psych: pleasant, normal affect    Assessment & Plan:  1) Chronic Pain Syndrome secondary to fibromyalgia, cervical stenosis.  -Has has tried Savella, Cymbalta, Gabapentin, Lyrica.  -Discussed current symptoms of pain  and history of pain.  -Discussed benefits of exercise in reducing pain. -Discussed following foods that may reduce pain: 1) Ginger 2) Blueberries 3) Salmon 4) Pumpkin seeds 5) dark chocolate 6) turmeric 7) tart cherries 8) virgin olive oil 9) chilli peppers 10) mint 11) red wine -Marijuana helps his pain.  -He had good benefit from epidural steroid injection last visit.  -He does yoga and Tai Chi- continue this.   2) Cervical Myofascial Syndrome:  -Trigger point injections next visit.  -Provided script for Flexeril.   3) Insomnia: -Takes Trazodone. Has tried Amitriptyline 105m without benefit.   4) Low back pain:  -Trigger point injections next visit.  -XR ordered.   60 minutes spent in reviewing patient's chart, discussing diffuse pain complaints, discussed marijuana use, discussed pain relieving foods, discussed his exercise regimen, prescribed flexeril prn, plan for trigger point injections next visit (an the mechanism of how this works), insomnia and medications he has tried for it (trazodone helps, not Amitriptyline), reviewed cervical spine imaging and explaining results to patients.

## 2021-10-25 ENCOUNTER — Encounter: Payer: Medicare HMO | Admitting: Physical Medicine and Rehabilitation

## 2021-10-25 LAB — CBC WITH DIFFERENTIAL/PLATELET
Absolute Monocytes: 649 cells/uL (ref 200–950)
Basophils Absolute: 62 cells/uL (ref 0–200)
Basophils Relative: 0.9 %
Eosinophils Absolute: 110 cells/uL (ref 15–500)
Eosinophils Relative: 1.6 %
HCT: 42.8 % (ref 38.5–50.0)
Hemoglobin: 14.6 g/dL (ref 13.2–17.1)
Lymphs Abs: 2608 cells/uL (ref 850–3900)
MCH: 31.5 pg (ref 27.0–33.0)
MCHC: 34.1 g/dL (ref 32.0–36.0)
MCV: 92.4 fL (ref 80.0–100.0)
MPV: 10.1 fL (ref 7.5–12.5)
Monocytes Relative: 9.4 %
Neutro Abs: 3471 cells/uL (ref 1500–7800)
Neutrophils Relative %: 50.3 %
Platelets: 340 10*3/uL (ref 140–400)
RBC: 4.63 10*6/uL (ref 4.20–5.80)
RDW: 13.8 % (ref 11.0–15.0)
Total Lymphocyte: 37.8 %
WBC: 6.9 10*3/uL (ref 3.8–10.8)

## 2021-10-25 LAB — COMPLETE METABOLIC PANEL WITH GFR
AG Ratio: 1.7 (calc) (ref 1.0–2.5)
ALT: 9 U/L (ref 9–46)
AST: 12 U/L (ref 10–40)
Albumin: 4.1 g/dL (ref 3.6–5.1)
Alkaline phosphatase (APISO): 63 U/L (ref 36–130)
BUN: 17 mg/dL (ref 7–25)
CO2: 29 mmol/L (ref 20–32)
Calcium: 9.2 mg/dL (ref 8.6–10.3)
Chloride: 107 mmol/L (ref 98–110)
Creat: 1.19 mg/dL (ref 0.60–1.29)
Globulin: 2.4 g/dL (calc) (ref 1.9–3.7)
Glucose, Bld: 95 mg/dL (ref 65–99)
Potassium: 4.2 mmol/L (ref 3.5–5.3)
Sodium: 140 mmol/L (ref 135–146)
Total Bilirubin: 0.3 mg/dL (ref 0.2–1.2)
Total Protein: 6.5 g/dL (ref 6.1–8.1)
eGFR: 76 mL/min/{1.73_m2} (ref >=60)

## 2021-10-25 LAB — LIPID PANEL
Cholesterol: 179 mg/dL (ref <200)
HDL: 42 mg/dL (ref >=40)
LDL Cholesterol (Calc): 120 mg/dL (calc) — ABNORMAL HIGH
Non-HDL Cholesterol (Calc): 137 mg/dL (calc) — ABNORMAL HIGH (ref <130)
Total CHOL/HDL Ratio: 4.3 (calc) (ref <5.0)
Triglycerides: 76 mg/dL (ref <150)

## 2021-10-26 NOTE — Progress Notes (Signed)
Internal Medicine Clinic Attending  Case discussed with Dr. Steen  At the time of the visit.  We reviewed the resident's history and exam and pertinent patient test results.  I agree with the assessment, diagnosis, and plan of care documented in the resident's note.  

## 2021-11-19 DIAGNOSIS — M5412 Radiculopathy, cervical region: Secondary | ICD-10-CM | POA: Diagnosis not present

## 2021-11-21 ENCOUNTER — Ambulatory Visit (INDEPENDENT_AMBULATORY_CARE_PROVIDER_SITE_OTHER): Payer: Medicare HMO | Admitting: Infectious Diseases

## 2021-11-21 ENCOUNTER — Other Ambulatory Visit: Payer: Self-pay

## 2021-11-21 ENCOUNTER — Encounter: Payer: Self-pay | Admitting: Infectious Diseases

## 2021-11-21 DIAGNOSIS — Z21 Asymptomatic human immunodeficiency virus [HIV] infection status: Secondary | ICD-10-CM | POA: Diagnosis not present

## 2021-11-21 DIAGNOSIS — Z8619 Personal history of other infectious and parasitic diseases: Secondary | ICD-10-CM

## 2021-11-21 DIAGNOSIS — M069 Rheumatoid arthritis, unspecified: Secondary | ICD-10-CM | POA: Diagnosis not present

## 2021-11-21 MED ORDER — BIKTARVY 50-200-25 MG PO TABS: ORAL_TABLET | ORAL | 11 refills | Status: DC

## 2021-11-21 NOTE — Patient Instructions (Signed)
Please continue your biktarvy everyday.  Would like to see you back in 4-6 months to check in on you and your labs.

## 2021-11-21 NOTE — Progress Notes (Signed)
Name: Jonathan Rangel  Date of Birth: October 18, 1976  MRN: [id number]  PCP: Madalyn Rob, MD     SUBJECTIVE:  Brief Narrative:  Jonathan Rangel is a 46 y.o. man with HIV diagnosed in 2012. He has been historically under excellent control. In care previously in [place].  HIV Risk: MSM. HIV OIs: none   Previous Regimens:  Atripla >> suppressed Biktarvy 2018 >> suppressed    Chief Complaint  Patient presents with   Follow-up    b20        HPI: Jonathan Rangel is here for routine follow up care for HIV. He is doing well and no concerns over HIV related cares. Has access to biktarvy and never misses a dose. No trouble with accessing medications.   He has started a course of injections to c-spine for cervicalgia symptoms and working with orthopedist and rheumatologist for these problems. He likes his care team very much. Takes as needed prednisone for joint pain but does not like to be on it long term.   Stressed with mother's healthcare - she is bed bound now and more total care with dementia.    Review of Systems  Constitutional:  Negative for chills and fever.  HENT:  Negative for tinnitus.   Eyes:  Negative for blurred vision and photophobia.  Respiratory:  Negative for cough and sputum production.   Cardiovascular:  Negative for chest pain.  Gastrointestinal:  Negative for diarrhea, nausea and vomiting.  Genitourinary:  Negative for dysuria.  Musculoskeletal:  Positive for back pain, joint pain and neck pain.  Skin:  Negative for rash.  Neurological:  Negative for headaches.     Past Medical History:  Diagnosis Date   Anal fistula    Asthma with COPD (chronic obstructive pulmonary disease) ([place])    followed by pcp   Chronic headache disorder    residual from benign neoplasm cerebral meninges post gamma knife 2016   Chronic pain    followed by pain clinic   Eczema    Fibromyalgia    History of Bell's palsy    per pt 1997 and 2015 both time right side , in 2015 found to have  meninges tumor   History of benign neoplasm of cerebral meninges    s/p  gamma knife 05-28-2015  right temporal lobe   HIV (human immunodeficiency virus infection) ([place]) 2012   followed by Loc Feinstein NP @ [facility]   RA (rheumatoid arthritis) ([place])    per pt was dx approx 2008,  followed by pain clinic , does not have a rheumatologist    Allergies  Allergen Reactions   Keflex [Cephalexin] Palpitations    Social History   Tobacco Use   Smoking status: Every Day    Years: 32.00    Types: Cigarettes   Smokeless tobacco: Never   Tobacco comments:    06-20-2021  per pt 2-3 cig per day/  1pp3days  Vaping Use   Vaping Use: Never used  Substance Use Topics   Alcohol use: Not Currently   Drug use: Not Currently    Types: Marijuana    Comment: [phone]      Social History   Substance and Sexual Activity  Sexual Activity Not Currently   Comment: declined condoms 02/2021    Physical Exam and Objective Findings:  Vitals:   11/21/21 1000  BP: 113/77  Pulse: 79  Temp: 98.2 F (36.8 C)  TempSrc: Temporal  SpO2: 97%  Weight: 166 lb (75.3 kg)   Body mass index  is 25.24 kg/m.    Physical Exam Vitals reviewed.  Constitutional:      Appearance: Normal appearance. He is not ill-appearing.  HENT:     Head: Normocephalic.     Mouth/Throat:     Mouth: Mucous membranes are moist.     Pharynx: Oropharynx is clear.  Eyes:     General: No scleral icterus. Cardiovascular:     Rate and Rhythm: Normal rate.  Pulmonary:     Effort: Pulmonary effort is normal.  Musculoskeletal:        General: Normal range of motion.     Cervical back: Normal range of motion.  Skin:    Coloration: Skin is not jaundiced or pale.  Neurological:     Mental Status: He is alert and oriented to person, place, and time.  Psychiatric:        Mood and Affect: Mood normal.        Judgment: Judgment normal.    Lab Results Lab Results  Component Value Date   WBC 6.9 10/24/2021   HGB 14.6  10/24/2021   HCT 42.8 10/24/2021   MCV 92.4 10/24/2021   PLT 340 10/24/2021    Lab Results  Component Value Date   CREATININE 1.19 10/24/2021   BUN 17 10/24/2021   NA 140 10/24/2021   K 4.2 10/24/2021   CL 107 10/24/2021   CO2 29 10/24/2021    Lab Results  Component Value Date   ALT 9 10/24/2021   AST 12 10/24/2021   ALKPHOS 67 10/18/2021   BILITOT 0.3 10/24/2021    Lab Results  Component Value Date   CHOL 179 10/24/2021   HDL 42 10/24/2021   LDLCALC 120 (H) 10/24/2021   TRIG 76 10/24/2021   CHOLHDL 4.3 10/24/2021   HIV 1 RNA Quant  Date Value  02/15/2021 <20 Copies/mL (H)  09/27/2020 44 Copies/mL (H)  03/09/2020 <20 NOT DETECTED copies/mL   CD4 T Cell Abs (/uL)  Date Value  10/19/2021 843  09/27/2020 870  03/09/2020 1,095   Lab Results  Component Value Date   HAV REACTIVE (A) 11/20/2017   Lab Results  Component Value Date   HEPBSAG NON-REACTIVE 11/20/2017   HEPBSAB BORDERLINE (A) 11/20/2017   ASSESSEMTN & PLAN:  Problem List Items Addressed This Visit       Unprioritized   HIV (human immunodeficiency virus infection) ([place]) (Chronic)    Well controlled on once daily Biktarvy. No changes to treatment at this time. Creatinine stable. CD4 > 900 and at his previously established baseline.  Follow up in 4-6 months to review prednisone use and if needed for further OI proph if more chronic.  Flu shot has been received.       Relevant Medications   bictegravir-emtricitabine-tenofovir AF (BIKTARVY) 50-200-25 MG TABS tablet   Rheumatoid arthritis [facility])    Working with rheumatology team out of [facility] - will need to review specifically frequency of prednisone. May benefit from Bactrim proph if > 10mg  equivalent for more than 2 weeks.       History of syphilis    Stable with low serofast titer 1:2 - 1:4. Same monogamous partner. Reviewed today that these only indicate a "memory" of past infection and nothing new. Continue to monitor yearly       Other Visit  Diagnoses     Asymptomatic HIV infection ([place])       Relevant Medications   bictegravir-emtricitabine-tenofovir AF (BIKTARVY) 50-200-25 MG TABS tablet      Janene Madeira, MSN,  NP-C [facility] [email or web address] Pager: [phone] Office: [place]: [phone]   11/21/2021  11:08 AM

## 2021-11-21 NOTE — Assessment & Plan Note (Signed)
Working with rheumatology team out of [facility] - will need to review specifically frequency of prednisone. May benefit from Bactrim proph if > 10mg  equivalent for more than 2 weeks.

## 2021-11-21 NOTE — Assessment & Plan Note (Signed)
Well controlled on once daily Biktarvy. No changes to treatment at this time. Creatinine stable. CD4 > 900 and at his previously established baseline.  Follow up in 4-6 months to review prednisone use and if needed for further OI proph if more chronic.  Flu shot has been received.

## 2021-11-21 NOTE — Assessment & Plan Note (Signed)
Stable with low serofast titer 1:2 - 1:4. Same monogamous partner. Reviewed today that these only indicate a "memory" of past infection and nothing new. Continue to monitor yearly

## 2021-11-29 ENCOUNTER — Other Ambulatory Visit: Payer: Self-pay

## 2021-11-29 ENCOUNTER — Encounter: Payer: Medicare HMO | Attending: Physical Medicine and Rehabilitation | Admitting: Physical Medicine and Rehabilitation

## 2021-11-29 ENCOUNTER — Encounter: Payer: Self-pay | Admitting: Physical Medicine and Rehabilitation

## 2021-11-29 VITALS — BP 121/83 | HR 73 | Ht 68.0 in | Wt 163.0 lb

## 2021-11-29 DIAGNOSIS — M4802 Spinal stenosis, cervical region: Secondary | ICD-10-CM | POA: Insufficient documentation

## 2021-11-29 DIAGNOSIS — M7918 Myalgia, other site: Secondary | ICD-10-CM | POA: Insufficient documentation

## 2021-11-29 NOTE — Progress Notes (Signed)
Subjective:    Patient ID: Jonathan Rangel, male    DOB: 1976/05/26, 46 y.o.   MRN: [id number]  HPI  Mrs. Jonathan Rangel is a 46 year old man who presents to establish care for cervical spine pain. He has a history of fibromyalgia, bilateral carpal tunnel syndrome, and rheumatoid arthritis.   Average pain is 10/10.   He feels he needs to restart hydrocodone- he takes 40m and he sometimes takes one in the morning and it lasts all day.   He is feeling drowsy with the gabapentin.   His pain in his shoulders and neck has been severe  He last took the hydrocodone last year and had been on it since 2008. The hydrocodone helped him more than the Percocet.   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   Lidocaine cream helps with the pain.   He has very tight cervical muscles- this is currently where his pain was worst. He used to get trigger point injections with excellent relief. He thinks he may have been getting it with steroids. [place] used to take Hydrocodone 146m muscle relaxer, 3037mf morphine (which he did not like). He does not like medications because he likes to stay focused. The hydrocodone used to work very well for him. He is currently on Gabapentin and this makes him shaky. He was taking it twice per day. He tried Lyrica and this caused swelling.   Pain Inventory Average Pain 10 Pain Right Now 8 My pain is intermittent, constant, sharp, burning, dull, stabbing, tingling, and aching  In the last 24 hours, has pain interfered with the following? General activity 8 Relation with others 8 Enjoyment of life 8 What TIME of day is your pain at its worst? morning , daytime, evening, and night Sleep (in general) Fair  Pain is worse with: walking, bending, sitting, standing, and some activites Pain improves with: rest Relief from Meds:  not taking medication for pain   Family History  Problem Relation Age of Onset   Breast cancer  Maternal Grandmother    Cancer Maternal Aunt    COPD Mother    Fibromyalgia Mother    Rheum arthritis Mother    Other Father        unsure of medical history   Social History   Socioeconomic History   Marital status: Single    Spouse name: Not on file   Number of children: 0   Years of education: 12   Highest education level: High school graduate  Occupational History   Occupation: Disabled  Tobacco Use   Smoking status: Every Day    Years: 32.00    Types: Cigarettes   Smokeless tobacco: Never   Tobacco comments:    06-20-2021  per pt 2-3 cig per day/  1pp3days  Vaping Use   Vaping Use: Never used  Substance and Sexual Activity   Alcohol use: Not Currently   Drug use: Not Currently    Types: Marijuana    Comment: [id number] activity: Not Currently    Comment: declined condoms 02/2021  Other Topics Concern   Not on file  Social History Narrative   Lives at home with mother.   Right-handed.   No daily caffeine use.   Social Determinants of Health   Financial Resource Strain: Not on file  Food Insecurity: Not on file  Transportation Needs: Not on file  Physical Activity: Not on file  Stress: Not on file  Social Connections: Not on file   Past Surgical History:  Procedure Laterality Date   ANAL FISTULOTOMY  2004   [place] WITH CUBITAL TUNNEL Right 2014   CHOLECYSTECTOMY, LAPAROSCOPIC  2009   COLONOSCOPY  2015   patient reported normal    PLACEMENT OF SETON N/A 06/22/2021   Procedure: incision and drainage of abcess;  Surgeon: Leighton Ruff, MD;  Location: [facility];  Service: General;  Laterality: N/A;   RECTAL EXAM UNDER ANESTHESIA N/A 06/22/2021   Procedure: RECTAL EXAM UNDER ANESTHESIA;  Surgeon: Leighton Ruff, MD;  Location: [facility];  Service: General;  Laterality: N/A;   Stereotactic destruction of lesion using gamma radiation  05/28/2015   right temporal lobe ;  in [place], [place]    Past Medical History:  Diagnosis Date   Anal fistula    Asthma with COPD (chronic obstructive pulmonary disease) ([place])    followed by pcp   Chronic headache disorder    residual from benign neoplasm cerebral meninges post gamma knife 2016   Chronic pain    followed by pain clinic   Eczema    Fibromyalgia    History of Bell's palsy    per pt 1997 and 2015 both time right side , in 2015 found to have meninges tumor   History of benign neoplasm of cerebral meninges    s/p  gamma knife 05-28-2015  right temporal lobe   HIV (human immunodeficiency virus infection) ([place]) 2012   followed by [facility]ira NP @ [facility]   RA (rheumatoid arthritis) ([place])    per pt was dx approx 2008,  followed by pain clinic , does not have a rheumatologist   BP 121/83    Pulse 73    Ht _0  (1.727 m)    Wt 163 lb (73.9 kg)    SpO2 96%    BMI 24.78 kg/m   Opioid Risk Score:   Fall Risk Score:  `1  Depression screen PHQ 2/9  Depression screen [facility] 2/9 11/29/2021 11/21/2021 10/17/2021 03/16/2021 03/10/2021 02/15/2021 12/16/2020  Decreased Interest 0 0 0 0 0 0 2  Down, Depressed, Hopeless 0 0 0 0 0 0 0  PHQ - 2 Score 0 0 0 0 0 0 2  Altered sleeping - - 0 - - - 3  Tired, decreased energy - - 0 - - - 3  Change in appetite - - 0 - - - 3  Feeling bad or failure about yourself  - - 0 - - - 0  Trouble concentrating - - 0 - - - 3  Moving slowly or fidgety/restless - - 0 - - - 2  Suicidal thoughts - - 0 - - - 0  PHQ-9 Score - - 0 - - - 16  Difficult doing work/chores - - Not difficult at all - - - Somewhat difficult  Some recent data might be hidden   Review of Systems  Constitutional:  Negative for diaphoresis and unexpected weight change.  Respiratory:  Negative for cough and shortness of breath.   Gastrointestinal:  Negative for abdominal pain, constipation and nausea.  Musculoskeletal:  Positive for back pain.       Pain in shoulders, elbows, hands, knees, ankles  Skin:  Negative for rash.       spasms   Neurological:  Negative for weakness and numbness.       Tingling  Psychiatric/Behavioral:  The patient is not  nervous/anxious.       Objective:   Physical Exam Gen: no distress, normal appearing HEENT: oral mucosa pink and moist, NCAT Cardio: Reg rate Chest: normal effort, normal rate of breathing Abd: soft, non-distended Ext: no edema Psych: pleasant, normal affect Skin: intact Neuro: Alert and oriented x3.  Musculoskeletal: Tight cervical mucles bilaterally.  Psych: pleasant, normal affect    Assessment & Plan:  1) Chronic Pain Syndrome secondary to fibromyalgia, cervical stenosis.  -Has has tried Savella, Cymbalta, Gabapentin, Lyrica.  -Discussed current symptoms of pain and history of pain.  -Discussed following foods that may reduce pain: 1) Ginger (especially studied for arthritis)- reduce leukotriene production to decrease inflammation 2) Blueberries- high in phytonutrients that decrease inflammation 3) Salmon- marine omega-3s reduce joint swelling and pain 4) Pumpkin seeds- reduce inflammation 5) dark chocolate- reduces inflammation 6) turmeric- reduces inflammation 7) tart cherries - reduce pain and stiffness 8) extra virgin olive oil - its compound olecanthal helps to block prostaglandins  9) chili peppers- can be eaten or applied topically via capsaicin 10) mint- helpful for headache, muscle aches, joint pain, and itching 11) garlic- reduces inflammation  Link to further information on diet for chronic pain: [email or web address].[email or web address]  -He had good benefit from epidural steroid injection last visit.  -He does yoga and Tai Chi- continue this.   2) Cervical Myofascial Syndrome:  -Trigger point injections next visit.  -Provided script for Flexeril.  -gave a tube of 5% lidocaine -advised to call me if he needs more and I will send a script for him.   3) Insomnia: -Takes Trazodone. Has tried  Amitriptyline 71m without benefit.   4) Low back pain:  -trigger point injections as needed -XR ordered.

## 2021-12-01 DIAGNOSIS — Z79899 Other long term (current) drug therapy: Secondary | ICD-10-CM | POA: Diagnosis not present

## 2021-12-01 DIAGNOSIS — G629 Polyneuropathy, unspecified: Secondary | ICD-10-CM | POA: Diagnosis not present

## 2021-12-01 DIAGNOSIS — L309 Dermatitis, unspecified: Secondary | ICD-10-CM | POA: Diagnosis not present

## 2021-12-01 DIAGNOSIS — M1990 Unspecified osteoarthritis, unspecified site: Secondary | ICD-10-CM | POA: Diagnosis not present

## 2021-12-01 DIAGNOSIS — M797 Fibromyalgia: Secondary | ICD-10-CM | POA: Diagnosis not present

## 2021-12-01 DIAGNOSIS — Z21 Asymptomatic human immunodeficiency virus [HIV] infection status: Secondary | ICD-10-CM | POA: Diagnosis not present

## 2021-12-01 DIAGNOSIS — M0609 Rheumatoid arthritis without rheumatoid factor, multiple sites: Secondary | ICD-10-CM | POA: Diagnosis not present

## 2021-12-02 ENCOUNTER — Other Ambulatory Visit: Payer: Self-pay | Admitting: Internal Medicine

## 2021-12-02 DIAGNOSIS — R0602 Shortness of breath: Secondary | ICD-10-CM

## 2021-12-02 DIAGNOSIS — L301 Dyshidrosis [pompholyx]: Secondary | ICD-10-CM

## 2022-01-03 ENCOUNTER — Ambulatory Visit: Payer: Medicare HMO | Admitting: Physical Medicine and Rehabilitation

## 2022-01-21 ENCOUNTER — Other Ambulatory Visit: Payer: Self-pay | Admitting: Internal Medicine

## 2022-01-21 DIAGNOSIS — L301 Dyshidrosis [pompholyx]: Secondary | ICD-10-CM

## 2022-02-24 ENCOUNTER — Emergency Department (HOSPITAL_COMMUNITY)
Admission: EM | Admit: 2022-02-24 | Discharge: 2022-02-24 | Discharge status: Against Medical Advice | Payer: Medicare HMO | Attending: Emergency Medicine | Admitting: Emergency Medicine

## 2022-02-24 ENCOUNTER — Emergency Department (HOSPITAL_COMMUNITY): Payer: Medicare HMO

## 2022-02-24 ENCOUNTER — Encounter (HOSPITAL_COMMUNITY): Payer: Self-pay

## 2022-02-24 DIAGNOSIS — Z5321 Procedure and treatment not carried out due to patient leaving prior to being seen by health care provider: Secondary | ICD-10-CM | POA: Diagnosis not present

## 2022-02-24 DIAGNOSIS — K219 Gastro-esophageal reflux disease without esophagitis: Secondary | ICD-10-CM | POA: Diagnosis not present

## 2022-02-24 DIAGNOSIS — R0602 Shortness of breath: Secondary | ICD-10-CM | POA: Insufficient documentation

## 2022-02-24 DIAGNOSIS — R079 Chest pain, unspecified: Secondary | ICD-10-CM | POA: Diagnosis not present

## 2022-02-24 DIAGNOSIS — J45909 Unspecified asthma, uncomplicated: Secondary | ICD-10-CM | POA: Diagnosis not present

## 2022-02-24 DIAGNOSIS — R0789 Other chest pain: Secondary | ICD-10-CM | POA: Diagnosis not present

## 2022-02-24 LAB — CBC
HCT: 44.1 % (ref 39.0–52.0)
Hemoglobin: 14.9 g/dL (ref 13.0–17.0)
MCH: 32.3 pg (ref 26.0–34.0)
MCHC: 33.8 g/dL (ref 30.0–36.0)
MCV: 95.5 fL (ref 80.0–100.0)
Platelets: 264 10*3/uL (ref 150–400)
RBC: 4.62 MIL/uL (ref 4.22–5.81)
RDW: 14.1 % (ref 11.5–15.5)
WBC: 7.3 10*3/uL (ref 4.0–10.5)
nRBC: 0 % (ref 0.0–0.2)

## 2022-02-24 LAB — TROPONIN I (HIGH SENSITIVITY): Troponin I (High Sensitivity): 3 ng/L (ref <18)

## 2022-02-24 LAB — BASIC METABOLIC PANEL
Anion gap: 5 (ref 5–15)
BUN: 11 mg/dL (ref 6–20)
CO2: 26 mmol/L (ref 22–32)
Calcium: 9.1 mg/dL (ref 8.9–10.3)
Chloride: 107 mmol/L (ref 98–111)
Creatinine, Ser: 1.04 mg/dL (ref 0.61–1.24)
GFR, Estimated: >60 mL/min (ref >60)
Glucose, Bld: 98 mg/dL (ref 70–99)
Potassium: 3.8 mmol/L (ref 3.5–5.1)
Sodium: 138 mmol/L (ref 135–145)

## 2022-02-24 NOTE — ED Triage Notes (Signed)
Pt arrived via POV, c/o left sided chest pain, and SOB, reproducible with walking, deep breathing.  ?

## 2022-02-24 NOTE — ED Notes (Signed)
Pt sts he has to leave and will come back tom ?

## 2022-02-25 ENCOUNTER — Emergency Department (HOSPITAL_COMMUNITY)
Admission: EM | Admit: 2022-02-25 | Discharge: 2022-02-25 | Discharge status: Against Medical Advice | Payer: Medicare HMO | Attending: Emergency Medicine | Admitting: Emergency Medicine

## 2022-02-25 ENCOUNTER — Other Ambulatory Visit: Payer: Self-pay

## 2022-02-25 ENCOUNTER — Encounter (HOSPITAL_COMMUNITY): Payer: Self-pay | Admitting: *Deleted

## 2022-02-25 DIAGNOSIS — Z5321 Procedure and treatment not carried out due to patient leaving prior to being seen by health care provider: Secondary | ICD-10-CM | POA: Insufficient documentation

## 2022-02-25 DIAGNOSIS — R0789 Other chest pain: Secondary | ICD-10-CM | POA: Diagnosis not present

## 2022-02-25 DIAGNOSIS — R0602 Shortness of breath: Secondary | ICD-10-CM | POA: Diagnosis not present

## 2022-02-25 NOTE — ED Provider Triage Note (Signed)
Emergency Medicine Provider Triage Evaluation Note ? ?Davian Hanshaw , a 46 y.o. male  was evaluated in triage.  Pt complains of left-sided chest tightness that has been intermittent over the past few months.  The patient also endorses shortness of breath.  The patient was evaluated yesterday but left due to wait times.  Chest x-ray and basic labs were completed yesterday. ? ?Review of Systems  ?Positive: Chest tightness, shortness of breath ?Negative: Abdominal pain ? ?Physical Exam  ?BP 130/80 (BP Location: Left Arm)   Pulse 78   Temp 99.3 ?F (37.4 ?C) (Oral)   Resp 16   Ht '5\' 8"'$  (1.727 m)   Wt 72.6 kg   SpO2 96%   BMI 24.33 kg/m?  ?Gen:   Awake, no distress   ?Resp:  Normal effort  ?MSK:   Moves extremities without difficulty  ?Other:   ? ?Medical Decision Making  ?Medically screening exam initiated at 10:38 AM.  Appropriate orders placed.  Olney Monier was informed that the remainder of the evaluation will be completed by another provider, this initial triage assessment does not replace that evaluation, and the importance of remaining in the ED until their evaluation is complete. ? ? ?  ?Dorothyann Peng, PA-C ?02/25/22 1039 ? ?

## 2022-02-25 NOTE — ED Triage Notes (Signed)
Pt c/o left chest pain, nasal congestion, breathing treatment this morning, pt states this feels like his asthma. ?

## 2022-02-26 ENCOUNTER — Encounter (HOSPITAL_COMMUNITY): Payer: Self-pay

## 2022-02-26 ENCOUNTER — Emergency Department (HOSPITAL_COMMUNITY)
Admission: EM | Admit: 2022-02-26 | Discharge: 2022-02-26 | Disposition: A | Discharge status: Home / Self Care | Payer: Medicare HMO | Attending: Emergency Medicine | Admitting: Emergency Medicine

## 2022-02-26 DIAGNOSIS — R0789 Other chest pain: Secondary | ICD-10-CM | POA: Diagnosis not present

## 2022-02-26 DIAGNOSIS — J45909 Unspecified asthma, uncomplicated: Secondary | ICD-10-CM | POA: Diagnosis not present

## 2022-02-26 DIAGNOSIS — R079 Chest pain, unspecified: Secondary | ICD-10-CM | POA: Diagnosis present

## 2022-02-26 DIAGNOSIS — Z7951 Long term (current) use of inhaled steroids: Secondary | ICD-10-CM | POA: Diagnosis not present

## 2022-02-26 DIAGNOSIS — K2100 Gastro-esophageal reflux disease with esophagitis, without bleeding: Secondary | ICD-10-CM | POA: Insufficient documentation

## 2022-02-26 MED ORDER — ALUM & MAG HYDROXIDE-SIMETH 200-200-20 MG/5ML PO SUSP
30.0000 mL | Freq: Once | ORAL | Status: AC
  Administered 2022-02-26: 30 mL via ORAL
  Filled 2022-02-26: qty 30

## 2022-02-26 MED ORDER — OMEPRAZOLE MAGNESIUM 20 MG PO TBEC: 20.0000 mg | DELAYED_RELEASE_TABLET | Freq: Every day | ORAL | 1 refills | Status: DC

## 2022-02-26 MED ORDER — PREDNISONE 50 MG PO TABS: 50.0000 mg | ORAL_TABLET | Freq: Every day | ORAL | 0 refills | Status: AC

## 2022-02-26 NOTE — Discharge Instructions (Addendum)
See your Physicain for recheck.  Return if any problems.  

## 2022-02-26 NOTE — ED Provider Notes (Signed)
?[name] ?Provider Note ? ? ?CSN: [id number] ?Arrival date & time: 02/26/22  1431 ? ?  ? ?History ? ?Chief Complaint  ?Patient presents with  ? Chest Pain  ? ? ?Jonathan Rangel is a 46 y.o. male. ? ?Pt complains of pain under his right scapula.  Pt reports he came in 2 days ago and 4 days ago.  Pt complains of belching up material.  Pt reports he feels like his asthma has been acting up.  Pt denies any history of heart disease.   ? ?The history is provided by the patient. No language interpreter was used.  ?Chest Pain ?Pain location:  R lateral chest and R chest ?Pain quality: aching   ?Pain radiates to:  Does not radiate ?Pain severity:  Moderate ?Onset quality:  Gradual ?Duration:  4 days ?Timing:  Constant ?Progression:  Worsening ?Chronicity:  New ?Relieved by:  Nothing ?Ineffective treatments:  None tried ?Associated symptoms: heartburn   ?Associated symptoms: no abdominal pain and no shortness of breath   ? ?  ? ?Home Medications ?Prior to Admission medications   ?Medication Sig Start Date End Date Taking? Authorizing Provider  ?omeprazole (PRILOSEC OTC) 20 MG tablet Take 1 tablet (20 mg total) by mouth daily. 02/26/22 02/26/23 Yes Fransico Meadow, PA-C  ?predniSONE (DELTASONE) 50 MG tablet Take 1 tablet (50 mg total) by mouth daily. 02/26/22  Yes Fransico Meadow, PA-C  ?VENTOLIN HFA 108 (90 Base) MCG/ACT inhaler Inhale 1-2 puffs into the lungs every 4 (four) hours as needed for shortness of breath. 12/05/21   Madalyn Rob, MD  ?albuterol (PROVENTIL) (2.5 MG/3ML) 0.083% nebulizer solution Take 2.5 mg by nebulization every 6 (six) hours as needed for wheezing or shortness of breath.    [provider]  ?betamethasone dipropionate 0.05 % cream APPLY TO AFFECTED AREA TWICE A DAY 01/25/22   Madalyn Rob, MD  ?bictegravir-emtricitabine-tenofovir AF (BIKTARVY) 50-200-25 MG TABS tablet 1 tablet    [provider]  ?cholestyramine (QUESTRAN) 4 g packet TAKE 1 PACKET AS  DIRECTED BY MOUTH DAILY ?Patient taking differently: Take 4 g by mouth daily. 12/17/20   Andrew Au, MD  ?FLUoxetine (PROZAC) 20 MG capsule TAKE 3 CAPSULES BY MOUTH EVERY DAY ?Patient taking differently: Take 60 mg by mouth daily. 09/16/21   Madalyn Rob, MD  ?fluticasone (FLONASE) 50 MCG/ACT nasal spray SPRAY 1 SPRAY INTO BOTH NOSTRILS DAILY ?Patient taking differently: Place 1 spray into both nostrils daily as needed for allergies. 09/16/21   Madalyn Rob, MD  ?gabapentin (NEURONTIN) 400 MG capsule TAKE 1 CAPSULE BY MOUTH THREE TIMES A DAY 10/24/21   Raulkar, Clide Deutscher, MD  ?hydroxychloroquine (PLAQUENIL) 200 MG tablet Take 200 mg by mouth daily. 09/07/21   [provider]  ?montelukast (SINGULAIR) 10 MG tablet TAKE 1 TABLET BY MOUTH EVERYDAY AT BEDTIME ?Patient taking differently: Take 10 mg by mouth at bedtime. 04/13/20   [place] Callas, NP  ?ondansetron (ZOFRAN) 4 MG tablet Take 1 tablet (4 mg total) by mouth every 8 (eight) hours as needed for nausea or vomiting. 07/20/21   [place] Callas, NP  ?traZODone (DESYREL) 100 MG tablet TAKE 1 TABLET BY MOUTH EVERYDAY AT BEDTIME 01/25/22   Madalyn Rob, MD  ?   ? ?Allergies    ?Keflex [cephalexin]   ? ?Review of Systems   ?Review of Systems  ?Respiratory:  Negative for shortness of breath.   ?Cardiovascular:  Positive for chest pain.  ?Gastrointestinal:  Positive for heartburn.  Negative for abdominal pain.  ?All other systems reviewed and are negative. ? ?Physical Exam ?Updated Vital Signs ?BP 121/88 (BP Location: Left Arm)   Pulse 82   Temp 98 ?F (36.7 ?C) (Oral)   Resp 18   SpO2 98%  ?Physical Exam ?Vitals and nursing note reviewed.  ?Constitutional:   ?   General: He is not in acute distress. ?   Appearance: He is well-developed.  ?HENT:  ?   Head: Normocephalic and atraumatic.  ?Eyes:  ?   Conjunctiva/sclera: Conjunctivae normal.  ?Cardiovascular:  ?   Rate and Rhythm: Normal rate and regular rhythm.  ?   Heart sounds: Normal heart sounds. No murmur  heard. ?Pulmonary:  ?   Effort: Pulmonary effort is normal. No respiratory distress.  ?   Breath sounds: Normal breath sounds.  ?Chest:  ?   Chest wall: Tenderness present.  ?   Comments: Tender back under scapula  ?Abdominal:  ?   Palpations: Abdomen is soft.  ?   Tenderness: There is no abdominal tenderness.  ?Musculoskeletal:     ?   General: No swelling.  ?   Cervical back: Neck supple.  ?Skin: ?   General: Skin is warm and dry.  ?   Capillary Refill: Capillary refill takes less than 2 seconds.  ?Neurological:  ?   Mental Status: He is alert.  ?Psychiatric:     ?   Mood and Affect: Mood normal.  ? ? ?ED Results / Procedures / Treatments   ?Labs ?(all labs ordered are listed, but only abnormal results are displayed) ?Labs Reviewed - No data to display ? ?EKG ?None ? ?Radiology ?No results found. ? ?Procedures ?Procedures  ? ? ?Medications Ordered in ED ?Medications  ?alum & mag hydroxide-simeth (MAALOX/MYLANTA) 200-200-20 MG/5ML suspension 30 mL (30 mLs Oral Given 02/26/22 1620)  ? ? ?ED Course/ Medical Decision Making/ A&P ?  ?                        ?Medical Decision Making ?Amount and/or Complexity of Data Reviewed ?External Data Reviewed: radiology and notes. ?   Details: Chest xray from 2 days ago reviewed,  no pneumonia, normal chest xray ? ?Risk ?OTC drugs. ?Prescription drug management. ?Risk Details: Pt reports relief from reflux symptoms with gi cocktail.  Pt does not have cardiac sounding symptoms.  I think back discomfort is muscular.  Pt reports using inhaler more frequently.  I advised tylenol for back discomfort.  Pt given an rx for prednisone for asthma.  He is advised to continue albuterol Pt given rx for prilosec for reflux ? ? ? ? ? ? ? ? ? ? ?Final Clinical Impression(s) / ED Diagnoses ?Final diagnoses:  ?Chest wall pain  ?Mild asthma without complication, unspecified whether persistent  ?Gastroesophageal reflux disease with esophagitis without hemorrhage  ? ? ?Rx / DC Orders ?ED Discharge  Orders   ? ?      Ordered  ?  predniSONE (DELTASONE) 50 MG tablet  Daily       ? 02/26/22 1710  ?  omeprazole (PRILOSEC OTC) 20 MG tablet  Daily       ? 02/26/22 1710  ? ?  ?  ? ?  ? ?An After Visit Summary was printed and given to the patient. ? ?  ?Fransico Meadow, [place] ?02/26/22 2254 ? ?  ?Lacretia Leigh, MD ?03/02/22 1059 ? ?

## 2022-02-26 NOTE — ED Triage Notes (Signed)
Pt presents with c/o chest pain on the left side radiating to his back. Pt also c/o congestion, has been here several times over the last few days for same and left because of the wait time. Pt reports he feels like this is his asthma. ?

## 2022-02-27 ENCOUNTER — Other Ambulatory Visit: Payer: Self-pay | Admitting: Physical Medicine and Rehabilitation

## 2022-02-27 ENCOUNTER — Other Ambulatory Visit: Payer: Self-pay | Admitting: Internal Medicine

## 2022-02-27 DIAGNOSIS — R0602 Shortness of breath: Secondary | ICD-10-CM

## 2022-02-27 DIAGNOSIS — L301 Dyshidrosis [pompholyx]: Secondary | ICD-10-CM

## 2022-03-03 ENCOUNTER — Encounter: Payer: Self-pay | Admitting: Internal Medicine

## 2022-03-03 ENCOUNTER — Ambulatory Visit (INDEPENDENT_AMBULATORY_CARE_PROVIDER_SITE_OTHER): Payer: Medicare HMO | Admitting: Internal Medicine

## 2022-03-03 DIAGNOSIS — Z0000 Encounter for general adult medical examination without abnormal findings: Secondary | ICD-10-CM

## 2022-03-03 NOTE — Progress Notes (Signed)
? ?Subjective:  ? Jonathan Rangel is a 46 y.o. male who presents for an Initial Medicare Annual Wellness Visit. ?I connected with  Jonathan Rangel on 03/03/22 by a audio enabled telemedicine application and verified that I am speaking with the correct person using two identifiers. ? ?Patient Location: Home ? ?Provider Location: Office/Clinic ? ?I discussed the limitations of evaluation and management by telemedicine. The patient expressed understanding and agreed to proceed.  ? ?Review of Systems    ?Defer to PCP. ?  ? ?   ?Objective:  ?  ?There were no vitals filed for this visit. ?There is no height or weight on file to calculate BMI. ? ? ?  02/26/2022  ?  2:57 PM 02/25/2022  ? 10:34 AM 10/19/2021  ? 12:00 PM 10/17/2021  ?  2:49 PM 06/22/2021  ?  8:27 AM 03/16/2021  ? 11:30 AM 12/16/2020  ?  2:20 PM  ?Advanced Directives  ?Does Patient Have a Medical Advance Directive? No No No No No No No  ?Would patient like information on creating a medical advance directive? No - Patient declined  No - Patient declined No - Patient declined No - Patient declined No - Patient declined No - Patient declined  ? ? ?Current Medications (verified) ?Outpatient Encounter Medications as of 03/03/2022  ?Medication Sig  ? gabapentin (NEURONTIN) 400 MG capsule TAKE 1 CAPSULE BY MOUTH THREE TIMES A DAY  ? albuterol (PROVENTIL) (2.5 MG/3ML) 0.083% nebulizer solution Take 2.5 mg by nebulization every 6 (six) hours as needed for wheezing or shortness of breath.  ? betamethasone dipropionate 0.05 % cream APPLY TO AFFECTED AREA TWICE A DAY  ? bictegravir-emtricitabine-tenofovir AF (BIKTARVY) 50-200-25 MG TABS tablet 1 tablet  ? cholestyramine (QUESTRAN) 4 g packet TAKE 1 PACKET AS DIRECTED BY MOUTH DAILY (Patient taking differently: Take 4 g by mouth daily.)  ? FLUoxetine (PROZAC) 20 MG capsule TAKE 3 CAPSULES BY MOUTH EVERY DAY (Patient taking differently: Take 60 mg by mouth daily.)  ? fluticasone (FLONASE) 50 MCG/ACT nasal spray SPRAY 1 SPRAY INTO BOTH  NOSTRILS DAILY (Patient taking differently: Place 1 spray into both nostrils daily as needed for allergies.)  ? hydroxychloroquine (PLAQUENIL) 200 MG tablet Take 200 mg by mouth daily.  ? montelukast (SINGULAIR) 10 MG tablet TAKE 1 TABLET BY MOUTH EVERYDAY AT BEDTIME (Patient taking differently: Take 10 mg by mouth at bedtime.)  ? omeprazole (PRILOSEC OTC) 20 MG tablet Take 1 tablet (20 mg total) by mouth daily.  ? ondansetron (ZOFRAN) 4 MG tablet Take 1 tablet (4 mg total) by mouth every 8 (eight) hours as needed for nausea or vomiting.  ? predniSONE (DELTASONE) 50 MG tablet Take 1 tablet (50 mg total) by mouth daily.  ? traZODone (DESYREL) 100 MG tablet TAKE 1 TABLET BY MOUTH EVERYDAY AT BEDTIME  ? VENTOLIN HFA 108 (90 Base) MCG/ACT inhaler INHALE 1-2 PUFFS INTO THE LUNGS EVERY 4 HOURS AS NEEDED FOR SHORTNESS OF BREATH.  ? ?No facility-administered encounter medications on file as of 03/03/2022.  ? ? ?Allergies (verified) ?Keflex [cephalexin]  ? ?History: ?Past Medical History:  ?Diagnosis Date  ? Anal fistula   ? Asthma with COPD (chronic obstructive pulmonary disease) ([place])   ? followed by pcp  ? Chronic headache disorder   ? residual from benign neoplasm cerebral meninges post gamma knife 2016  ? Chronic pain   ? followed by pain clinic  ? Eczema   ? Fibromyalgia   ? History of Bell's palsy   ? per  pt 1997 and 2015 both time right side , in 2015 found to have meninges tumor  ? History of benign neoplasm of cerebral meninges   ? s/p  gamma knife 05-28-2015  right temporal lobe  ? HIV (human immunodeficiency virus infection) ([place]) 2012  ? followed by [facility] @ [facility]  ? RA (rheumatoid arthritis) ([place])   ? per pt was dx approx 2008,  followed by pain clinic , does not have a rheumatologist  ? ?Past Surgical History:  ?Procedure Laterality Date  ? ANAL FISTULOTOMY  2004  ? CARPAL TUNNEL WITH CUBITAL TUNNEL Right 2014  ? CHOLECYSTECTOMY, LAPAROSCOPIC  2009  ? COLONOSCOPY  2015  ? patient reported normal   ?  PLACEMENT OF SETON N/A 06/22/2021  ? Procedure: incision and drainage of abcess;  Surgeon: Leighton Ruff, MD;  Location: [facility];  Service: General;  Laterality: N/A;  ? RECTAL EXAM UNDER ANESTHESIA N/A 06/22/2021  ? Procedure: RECTAL EXAM UNDER ANESTHESIA;  Surgeon: Leighton Ruff, MD;  Location[facility]  Service: General;  Laterality: N/A;  ? Stereotactic destruction of lesion using gamma radiation  05/28/2015  ? right temporal lobe ;  in [place], [place]  ? ?Family History  ?Problem Relation Age of Onset  ? Breast cancer Maternal Grandmother   ? Cancer Maternal Aunt   ? COPD Mother   ? Fibromyalgia Mother   ? Rheum arthritis Mother   ? Other Father   ?     unsure of medical history  ? ?Social History  ? ?Socioeconomic History  ? Marital status: Single  ?  Spouse name: Not on file  ? Number of children: 0  ? Years of education: 10  ? Highest education level: High school graduate  ?Occupational History  ? Occupation: Disabled  ?Tobacco Use  ? Smoking status: Every Day  ?  Years: 32.00  ?  Types: Cigarettes  ? Smokeless tobacco: Never  ? Tobacco comments:  ?  06-20-2021  per pt 2-3 cig per day/  1pp3days  ?Vaping Use  ? Vaping Use: Never used  ?Substance and Sexual Activity  ? Alcohol use: Not Currently  ? Drug use: Not Currently  ?  Types: Marijuana  ?  Comment: [phone]  ? Sexual activity: Not Currently  ?  Comment: declined condoms 02/2021  ?Other Topics Concern  ? Not on file  ?Social History Narrative  ? Lives at home with mother.  ? Right-handed.  ? No daily caffeine use.  ? ?Social Determinants of Health  ? ?Financial Resource Strain: Not on file  ?Food Insecurity: Not on file  ?Transportation Needs: Not on file  ?Physical Activity: Not on file  ?Stress: Not on file  ?Social Connections: Not on file  ? ? ?Tobacco Counseling ?Ready to quit: Not Answered ?Counseling given: Not Answered ?Tobacco comments: 06-20-2021  per pt 2-3 cig per day/   1pp3days ? ? ?Clinical Intake: ? ?  ? ?  ? ?  ? ?  ? ?  ? ?Diabetic?NO ? ?  ? ?  ? ? ?Activities of Daily Living ? ?  10/19/2021  ? 12:00 PM 10/17/2021  ?  2:48 PM  ?In your present state of health, do you have any difficulty performing the following activities:  ?Hearing? 0 0  ?Vision? 1 1  ?Comment  blurry at times  ?Difficulty concentrating or making decisions? 1 1  ?Walking or climbing stairs? 1 1  ?Dressing or bathing?  0 0  ?Doing errands, shopping? 0 0  ? ? ?Patient Care Team: ?Madalyn Rob, MD as PCP - General ? Callas, NP as Nurse Practitioner (Infectious Diseases) ?Leandrew Koyanagi, MD as Attending Physician (Orthopedic Surgery) ?Izora Ribas, MD as Consulting Physician (Physical Medicine and Rehabilitation) ? ?Indicate any recent Medical Services you may have received from other than [facility] providers in the past year (date may be approximate). ? ?   ?Assessment:  ? This is a routine wellness examination for Raj. ? ?Hearing/Vision screen ?No results found. ? ?Dietary issues and exercise activities discussed: ?  ? ? Goals Addressed   ?None ?  ?Depression Screen ? ?  11/29/2021  ?  2:23 PM 11/21/2021  ? 10:03 AM 10/17/2021  ?  2:48 PM 03/16/2021  ? 11:30 AM 03/10/2021  ? 10:29 AM 02/15/2021  ? 10:17 AM 12/16/2020  ?  2:50 PM  ?PHQ 2/9 Scores  ?PHQ - 2 Score 0 0 0 0 0 0 2  ?PHQ- 9 Score   0    16  ?  ?Fall Risk ? ?  11/21/2021  ? 10:03 AM 10/17/2021  ?  2:48 PM 06/02/2021  ? 10:33 AM 03/16/2021  ? 11:28 AM 03/10/2021  ? 10:28 AM  ?Fall Risk   ?Falls in the past year? 0 0 0 1 1  ?Number falls in past yr: 0 0  0 0  ?Comment     02/28/21  ?Injury with Fall? 0 0  0 0  ?Risk for fall due to : No Fall Risks No Fall Risks     ?Follow up Falls evaluation completed Falls evaluation completed;Falls prevention discussed     ? ? ?FALL RISK PREVENTION PERTAINING TO THE HOME: ? ?Any stairs in or around the home? No  ?If so, are there any without handrails? No  ?Home free of loose throw rugs in walkways, pet beds, electrical cords,  etc? Yes ?Adequate lighting in your home to reduce risk of falls? Yes ? ?ASSISTIVE DEVICES UTILIZED TO PREVENT FALLS: ? ?Life alert? No  ?Use of a cane, walker or w/c? No  ?Grab bars in the bathroom? No  ?Civil engineer, contracting

## 2022-03-03 NOTE — Patient Instructions (Signed)
Health Maintenance, Male Adopting a healthy lifestyle and getting preventive care are important in promoting health and wellness. Ask your health care provider about: The right schedule for you to have regular tests and exams. Things you can do on your own to prevent diseases and keep yourself healthy. What should I know about diet, weight, and exercise? Eat a healthy diet  Eat a diet that includes plenty of vegetables, fruits, low-fat dairy products, and lean protein. Do not eat a lot of foods that are high in solid fats, added sugars, or sodium. Maintain a healthy weight Body mass index (BMI) is a measurement that can be used to identify possible weight problems. It estimates body fat based on height and weight. Your health care provider can help determine your BMI and help you achieve or maintain a healthy weight. Get regular exercise Get regular exercise. This is one of the most important things you can do for your health. Most adults should: Exercise for at least 150 minutes each week. The exercise should increase your heart rate and make you sweat (moderate-intensity exercise). Do strengthening exercises at least twice a week. This is in addition to the moderate-intensity exercise. Spend less time sitting. Even light physical activity can be beneficial. Watch cholesterol and blood lipids Have your blood tested for lipids and cholesterol at 47 years of age, then have this test every 5 years. You may need to have your cholesterol levels checked more often if: Your lipid or cholesterol levels are high. You are older than 47 years of age. You are at high risk for heart disease. What should I know about cancer screening? Many types of cancers can be detected early and may often be prevented. Depending on your health history and family history, you may need to have cancer screening at various ages. This may include screening for: Colorectal cancer. Prostate cancer. Skin cancer. Lung  cancer. What should I know about heart disease, diabetes, and high blood pressure? Blood pressure and heart disease High blood pressure causes heart disease and increases the risk of stroke. This is more likely to develop in people who have high blood pressure readings or are overweight. Talk with your health care provider about your target blood pressure readings. Have your blood pressure checked: Every 3-5 years if you are 18-39 years of age. Every year if you are 40 years old or older. If you are between the ages of 65 and 75 and are a current or former smoker, ask your health care provider if you should have a one-time screening for abdominal aortic aneurysm (AAA). Diabetes Have regular diabetes screenings. This checks your fasting blood sugar level. Have the screening done: Once every three years after age 45 if you are at a normal weight and have a low risk for diabetes. More often and at a younger age if you are overweight or have a high risk for diabetes. What should I know about preventing infection? Hepatitis B If you have a higher risk for hepatitis B, you should be screened for this virus. Talk with your health care provider to find out if you are at risk for hepatitis B infection. Hepatitis C Blood testing is recommended for: Everyone born from 1945 through 1965. Anyone with known risk factors for hepatitis C. Sexually transmitted infections (STIs) You should be screened each year for STIs, including gonorrhea and chlamydia, if: You are sexually active and are younger than 47 years of age. You are older than 47 years of age and your   health care provider tells you that you are at risk for this type of infection. Your sexual activity has changed since you were last screened, and you are at increased risk for chlamydia or gonorrhea. Ask your health care provider if you are at risk. Ask your health care provider about whether you are at high risk for HIV. Your health care provider  may recommend a prescription medicine to help prevent HIV infection. If you choose to take medicine to prevent HIV, you should first get tested for HIV. You should then be tested every 3 months for as long as you are taking the medicine. Follow these instructions at home: Alcohol use Do not drink alcohol if your health care provider tells you not to drink. If you drink alcohol: Limit how much you have to 0-2 drinks a day. Know how much alcohol is in your drink. In the [place] one drink equals one 12 oz bottle of beer (355 mL), one 5 oz glass of wine (148 mL), or one 1 oz glass of hard liquor (44 mL). Lifestyle Do not use any products that contain nicotine or tobacco. These products include cigarettes, chewing tobacco, and vaping devices, such as e-cigarettes. If you need help quitting, ask your health care provider. Do not use street drugs. Do not share needles. Ask your health care provider for help if you need support or information about quitting drugs. General instructions Schedule regular health, dental, and eye exams. Stay current with your vaccines. Tell your health care provider if: You often feel depressed. You have ever been abused or do not feel safe at home. Summary Adopting a healthy lifestyle and getting preventive care are important in promoting health and wellness. Follow your health care provider's instructions about healthy diet, exercising, and getting tested or screened for diseases. Follow your health care provider's instructions on monitoring your cholesterol and blood pressure. This information is not intended to replace advice given to you by your health care provider. Make sure you discuss any questions you have with your health care provider. Document Revised: 03/21/2021 Document Reviewed: 03/21/2021 Elsevier Patient Education  2023 [employer]  

## 2022-03-22 NOTE — Progress Notes (Signed)
Internal Medicine Clinic Attending  I reviewed the AWV findings.  I agree with the assessment, diagnosis, and plan of care documented in the AWV note.     

## 2022-03-27 ENCOUNTER — Encounter: Payer: Medicare HMO | Admitting: Physical Medicine and Rehabilitation

## 2022-04-11 ENCOUNTER — Encounter: Payer: Medicare HMO | Attending: Physical Medicine and Rehabilitation | Admitting: Physical Medicine and Rehabilitation

## 2022-05-01 ENCOUNTER — Other Ambulatory Visit: Payer: Self-pay

## 2022-05-01 MED ORDER — BIKTARVY 50-200-25 MG PO TABS: 1.0000 | ORAL_TABLET | Freq: Every day | ORAL | 2 refills | Status: AC

## 2022-05-19 ENCOUNTER — Other Ambulatory Visit: Payer: Self-pay

## 2022-05-19 DIAGNOSIS — Z21 Asymptomatic human immunodeficiency virus [HIV] infection status: Secondary | ICD-10-CM

## 2022-05-19 DIAGNOSIS — Z113 Encounter for screening for infections with a predominantly sexual mode of transmission: Secondary | ICD-10-CM

## 2022-05-19 DIAGNOSIS — Z79899 Other long term (current) drug therapy: Secondary | ICD-10-CM

## 2022-05-22 ENCOUNTER — Other Ambulatory Visit: Payer: Medicare HMO

## 2022-06-05 ENCOUNTER — Encounter: Payer: Medicare HMO | Admitting: Infectious Diseases

## 2022-06-15 ENCOUNTER — Telehealth: Payer: Self-pay

## 2022-06-15 NOTE — Telephone Encounter (Signed)
Attempted to call patient to reschedule missed appointment from 7/24. Not able to reach him at this time. Voicemail not set up. Leatrice Jewels, RMA

## 2022-06-27 IMAGING — CR DG CHEST 2V
2 series · 2 of 2 positions shown · non-contrast
Comparison: None.

CLINICAL DATA: Chest pain

EXAM:
CHEST - 2 VIEW

[w chest pa]
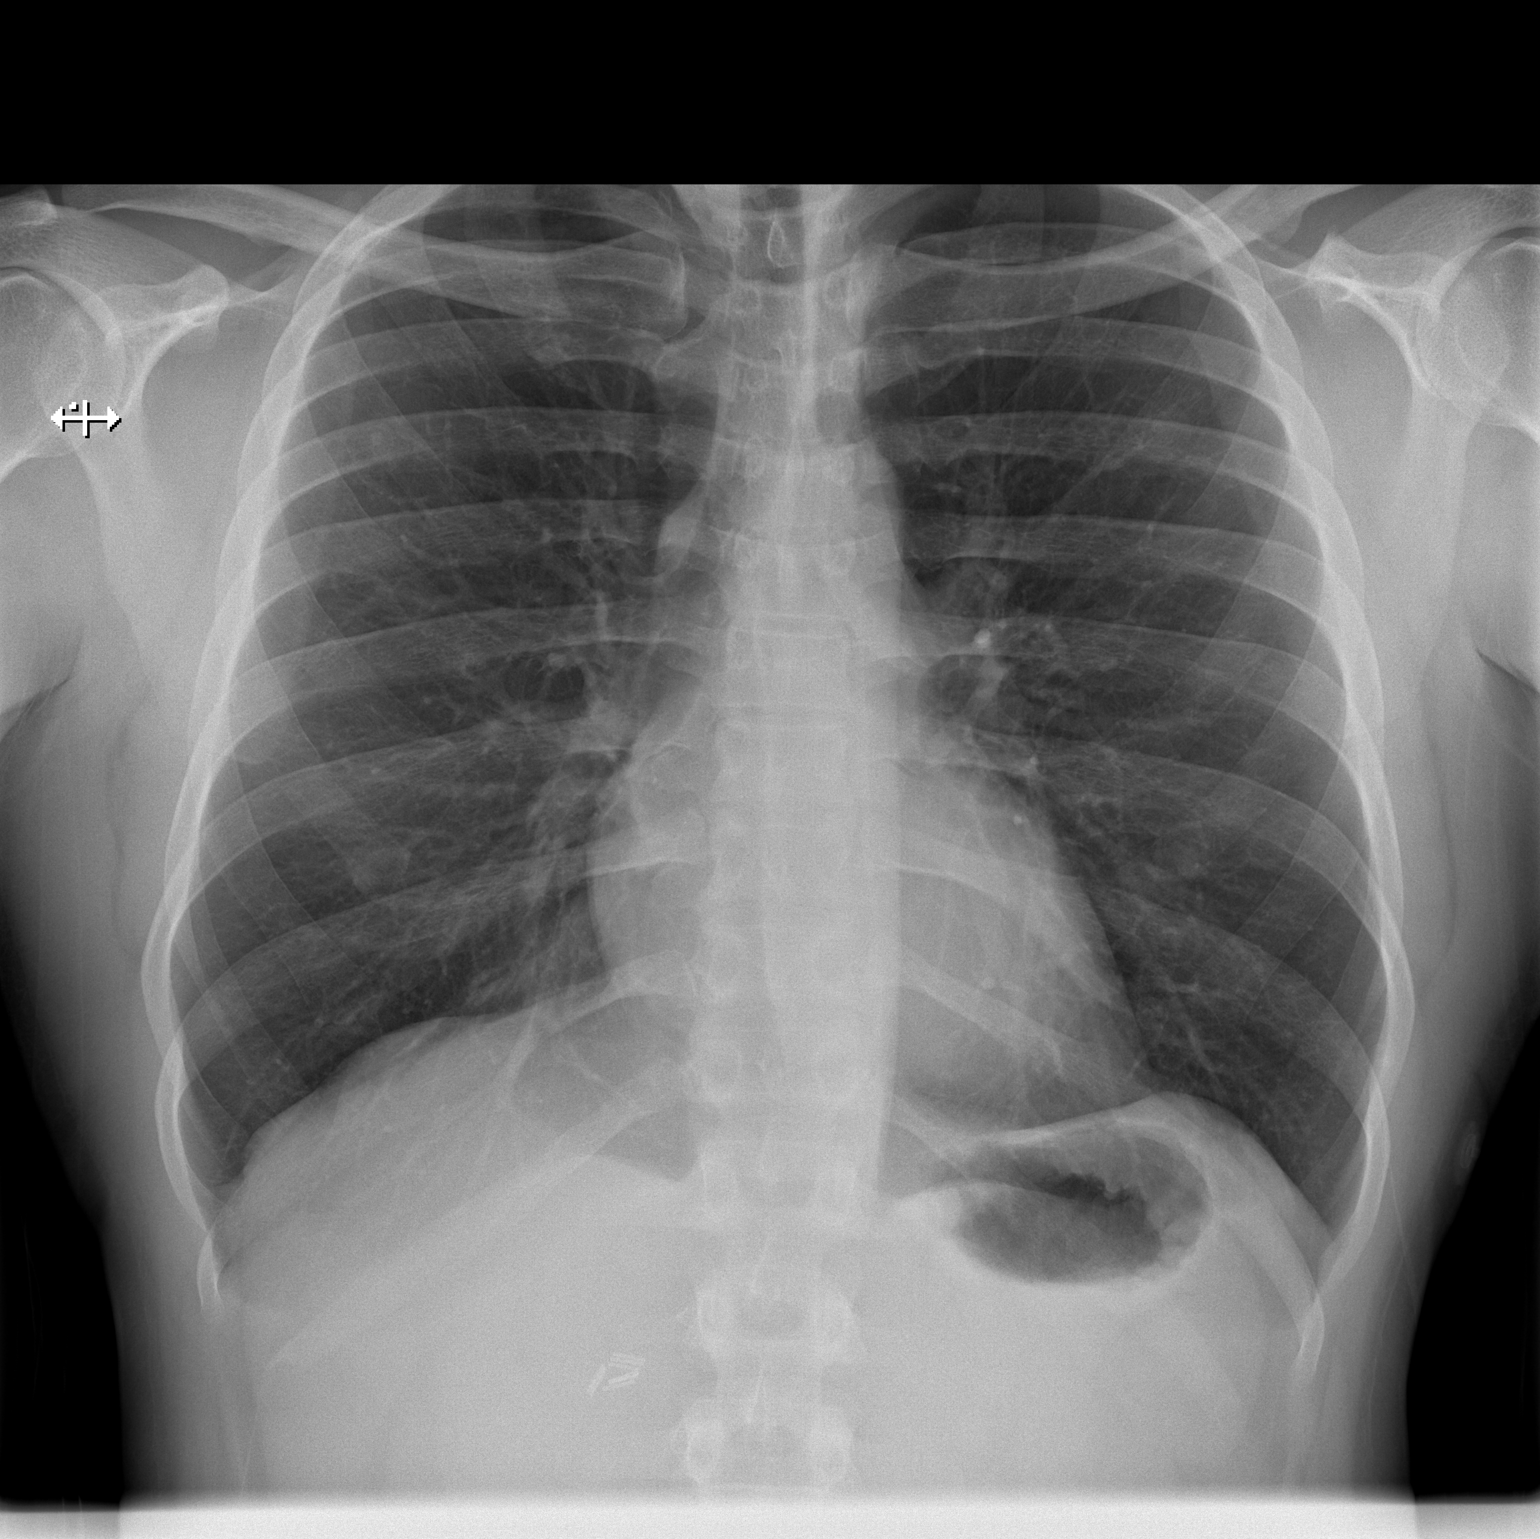

[w chest lat]
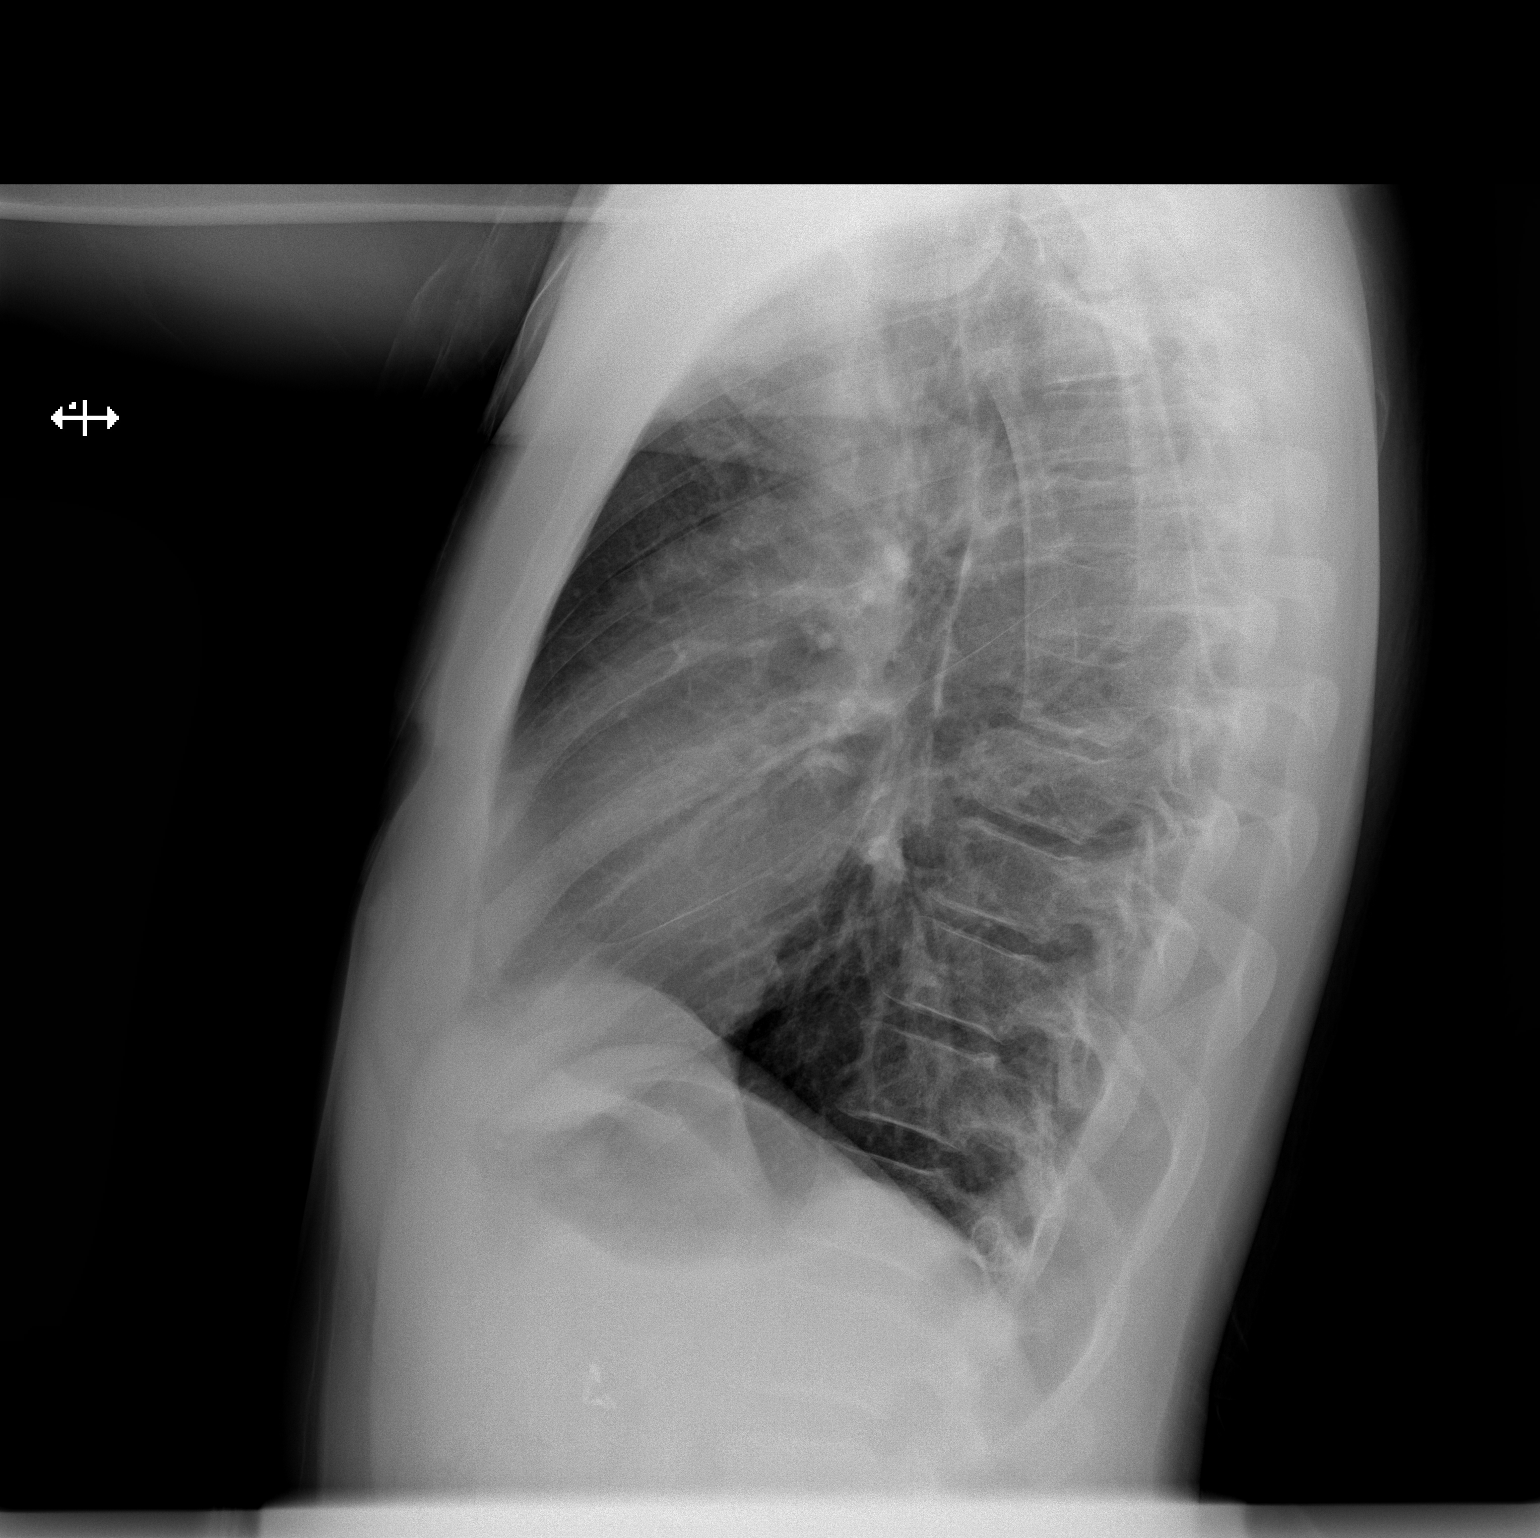

[2 of 2 positions shown; findings below may reference images not displayed]

FINDINGS: Cardiac size is within normal limits. There are no signs of
pulmonary edema or focal pulmonary consolidation. Surgical clips are
seen in gallbladder fossa.
IMPRESSION: No active cardiopulmonary disease.

## 2022-08-02 ENCOUNTER — Telehealth: Payer: Self-pay

## 2022-08-02 NOTE — Telephone Encounter (Signed)
Patient called, he is in [place] and will be transferring his care there. Emailed patient release of information form to complete and sign.   Notified [facility] of transfer.   Beryle Flock, RN

## 2022-09-15 ENCOUNTER — Other Ambulatory Visit: Payer: Self-pay

## 2023-04-17 ENCOUNTER — Encounter: Payer: Self-pay | Admitting: *Deleted

## 2023-08-20 ENCOUNTER — Other Ambulatory Visit: Payer: Self-pay | Admitting: Infectious Diseases

## 2023-08-22 ENCOUNTER — Other Ambulatory Visit: Payer: Self-pay | Admitting: Infectious Diseases
